# Patient Record
Sex: Female | Born: 1935
Health system: Southern US, Community
[De-identification: ages and names within clinical notes are randomized; demographics above are authoritative.]

## PROBLEM LIST (undated history)

## (undated) DIAGNOSIS — R079 Chest pain, unspecified: Secondary | ICD-10-CM

## (undated) DIAGNOSIS — R42 Dizziness and giddiness: Secondary | ICD-10-CM

## (undated) DIAGNOSIS — I10 Essential (primary) hypertension: Secondary | ICD-10-CM

## (undated) DIAGNOSIS — E785 Hyperlipidemia, unspecified: Secondary | ICD-10-CM

## (undated) HISTORY — DX: Essential (primary) hypertension: I10

## (undated) HISTORY — DX: Hyperlipidemia, unspecified: E78.5

## (undated) HISTORY — DX: Dizziness and giddiness: R42

## (undated) HISTORY — DX: Chest pain, unspecified: R07.9

---

## 1978-12-01 HISTORY — PX: BREAST SURGERY: SHX581

## 2013-12-07 DIAGNOSIS — I10 Essential (primary) hypertension: Secondary | ICD-10-CM | POA: Diagnosis not present

## 2013-12-21 DIAGNOSIS — I10 Essential (primary) hypertension: Secondary | ICD-10-CM | POA: Diagnosis not present

## 2014-03-22 DIAGNOSIS — I10 Essential (primary) hypertension: Secondary | ICD-10-CM | POA: Diagnosis not present

## 2014-03-22 DIAGNOSIS — E7800 Pure hypercholesterolemia, unspecified: Secondary | ICD-10-CM | POA: Diagnosis not present

## 2014-03-22 DIAGNOSIS — Z0000 Encounter for general adult medical examination without abnormal findings: Secondary | ICD-10-CM | POA: Diagnosis not present

## 2014-03-28 DIAGNOSIS — Z0000 Encounter for general adult medical examination without abnormal findings: Secondary | ICD-10-CM | POA: Diagnosis not present

## 2014-03-28 DIAGNOSIS — E559 Vitamin D deficiency, unspecified: Secondary | ICD-10-CM | POA: Diagnosis not present

## 2014-03-28 DIAGNOSIS — I10 Essential (primary) hypertension: Secondary | ICD-10-CM | POA: Diagnosis not present

## 2014-03-28 DIAGNOSIS — E7800 Pure hypercholesterolemia, unspecified: Secondary | ICD-10-CM | POA: Diagnosis not present

## 2014-03-29 DIAGNOSIS — Z1231 Encounter for screening mammogram for malignant neoplasm of breast: Secondary | ICD-10-CM | POA: Diagnosis not present

## 2014-03-29 DIAGNOSIS — R922 Inconclusive mammogram: Secondary | ICD-10-CM | POA: Diagnosis not present

## 2014-03-29 LAB — HM MAMMOGRAPHY

## 2014-08-28 DIAGNOSIS — H2510 Age-related nuclear cataract, unspecified eye: Secondary | ICD-10-CM | POA: Diagnosis not present

## 2014-10-09 DIAGNOSIS — H35033 Hypertensive retinopathy, bilateral: Secondary | ICD-10-CM | POA: Diagnosis not present

## 2014-10-09 DIAGNOSIS — H26491 Other secondary cataract, right eye: Secondary | ICD-10-CM | POA: Diagnosis not present

## 2014-10-09 DIAGNOSIS — H02839 Dermatochalasis of unspecified eye, unspecified eyelid: Secondary | ICD-10-CM | POA: Diagnosis not present

## 2014-10-09 DIAGNOSIS — Z961 Presence of intraocular lens: Secondary | ICD-10-CM | POA: Diagnosis not present

## 2015-11-08 ENCOUNTER — Encounter (INDEPENDENT_AMBULATORY_CARE_PROVIDER_SITE_OTHER): Payer: Self-pay

## 2015-11-08 ENCOUNTER — Ambulatory Visit (INDEPENDENT_AMBULATORY_CARE_PROVIDER_SITE_OTHER): Payer: Medicare Other | Admitting: Internal Medicine

## 2015-11-08 ENCOUNTER — Encounter: Payer: Self-pay | Admitting: Internal Medicine

## 2015-11-08 VITALS — BP 186/90 | HR 81 | Temp 98.4°F | Ht 58.5 in | Wt 144.0 lb

## 2015-11-08 DIAGNOSIS — E785 Hyperlipidemia, unspecified: Secondary | ICD-10-CM

## 2015-11-08 DIAGNOSIS — I10 Essential (primary) hypertension: Secondary | ICD-10-CM | POA: Diagnosis not present

## 2015-11-08 LAB — COMPREHENSIVE METABOLIC PANEL
ALK PHOS: 74 U/L (ref 39–117)
ALT: 15 U/L (ref 0–35)
AST: 23 U/L (ref 0–37)
Albumin: 4.2 g/dL (ref 3.5–5.2)
BUN: 11 mg/dL (ref 6–23)
CO2: 30 mEq/L (ref 19–32)
Calcium: 10.5 mg/dL (ref 8.4–10.5)
Chloride: 103 mEq/L (ref 96–112)
Creatinine, Ser: 1.06 mg/dL (ref 0.40–1.20)
GFR: 64.6 mL/min (ref >60.00)
GLUCOSE: 99 mg/dL (ref 70–99)
POTASSIUM: 4.5 meq/L (ref 3.5–5.1)
Sodium: 140 mEq/L (ref 135–145)
TOTAL PROTEIN: 7.4 g/dL (ref 6.0–8.3)
Total Bilirubin: 0.5 mg/dL (ref 0.2–1.2)

## 2015-11-08 LAB — CBC
HEMATOCRIT: 41.1 % (ref 36.0–46.0)
HEMOGLOBIN: 13.1 g/dL (ref 12.0–15.0)
MCHC: 31.8 g/dL (ref 30.0–36.0)
MCV: 85.5 fl (ref 78.0–100.0)
Platelets: 228 10*3/uL (ref 150.0–400.0)
RBC: 4.81 Mil/uL (ref 3.87–5.11)
RDW: 14.2 % (ref 11.5–15.5)
WBC: 5.6 10*3/uL (ref 4.0–10.5)

## 2015-11-08 LAB — LIPID PANEL
Cholesterol: 287 mg/dL — ABNORMAL HIGH (ref 0–200)
HDL: 58.7 mg/dL (ref >39.00)
LDL Cholesterol: 192 mg/dL — ABNORMAL HIGH (ref 0–99)
NONHDL: 227.95
TRIGLYCERIDES: 179 mg/dL — AB (ref 0.0–149.0)
Total CHOL/HDL Ratio: 5
VLDL: 35.8 mg/dL (ref 0.0–40.0)

## 2015-11-08 MED ORDER — LISINOPRIL-HYDROCHLOROTHIAZIDE 10-12.5 MG PO TABS: 1.0000 | ORAL_TABLET | Freq: Every day | ORAL | Status: DC

## 2015-11-08 NOTE — Assessment & Plan Note (Signed)
Encouraged her to consume a low fat diet Will check Lipid Profile and CMET today Will refill Pravachol if needed

## 2015-11-08 NOTE — Assessment & Plan Note (Signed)
CBC and CMET today Lisinopril HCT refilled  RTC in 1 month for BP followup

## 2015-11-08 NOTE — Patient Instructions (Signed)
Hypertension Hypertension, commonly called high blood pressure, is when the force of blood pumping through your arteries is too strong. Your arteries are the blood vessels that carry blood from your heart throughout your body. A blood pressure reading consists of a higher number over a lower number, such as 110/72. The higher number (systolic) is the pressure inside your arteries when your heart pumps. The lower number (diastolic) is the pressure inside your arteries when your heart relaxes. Ideally you want your blood pressure below 120/80. Hypertension forces your heart to work harder to pump blood. Your arteries may become narrow or stiff. Having untreated or uncontrolled hypertension can cause heart attack, stroke, kidney disease, and other problems. RISK FACTORS Some risk factors for high blood pressure are controllable. Others are not.  Risk factors you cannot control include:   Race. You may be at higher risk if you are African American.  Age. Risk increases with age.  Gender. Men are at higher risk than women before age 45 years. After age 65, women are at higher risk than men. Risk factors you can control include:  Not getting enough exercise or physical activity.  Being overweight.  Getting too much fat, sugar, calories, or salt in your diet.  Drinking too much alcohol. SIGNS AND SYMPTOMS Hypertension does not usually cause signs or symptoms. Extremely high blood pressure (hypertensive crisis) may cause headache, anxiety, shortness of breath, and nosebleed. DIAGNOSIS To check if you have hypertension, your health care provider will measure your blood pressure while you are seated, with your arm held at the level of your heart. It should be measured at least twice using the same arm. Certain conditions can cause a difference in blood pressure between your right and left arms. A blood pressure reading that is higher than normal on one occasion does not mean that you need treatment. If  it is not clear whether you have high blood pressure, you may be asked to return on a different day to have your blood pressure checked again. Or, you may be asked to monitor your blood pressure at home for 1 or more weeks. TREATMENT Treating high blood pressure includes making lifestyle changes and possibly taking medicine. Living a healthy lifestyle can help lower high blood pressure. You may need to change some of your habits. Lifestyle changes may include:  Following the DASH diet. This diet is high in fruits, vegetables, and whole grains. It is low in salt, red meat, and added sugars.  Keep your sodium intake below 2,300 mg per day.  Getting at least 30-45 minutes of aerobic exercise at least 4 times per week.  Losing weight if necessary.  Not smoking.  Limiting alcoholic beverages.  Learning ways to reduce stress. Your health care provider may prescribe medicine if lifestyle changes are not enough to get your blood pressure under control, and if one of the following is true:  You are 18-59 years of age and your systolic blood pressure is above 140.  You are 60 years of age or older, and your systolic blood pressure is above 150.  Your diastolic blood pressure is above 90.  You have diabetes, and your systolic blood pressure is over 140 or your diastolic blood pressure is over 90.  You have kidney disease and your blood pressure is above 140/90.  You have heart disease and your blood pressure is above 140/90. Your personal target blood pressure may vary depending on your medical conditions, your age, and other factors. HOME CARE INSTRUCTIONS    Have your blood pressure rechecked as directed by your health care provider.   Take medicines only as directed by your health care provider. Follow the directions carefully. Blood pressure medicines must be taken as prescribed. The medicine does not work as well when you skip doses. Skipping doses also puts you at risk for  problems.  Do not smoke.   Monitor your blood pressure at home as directed by your health care provider. SEEK MEDICAL CARE IF:   You think you are having a reaction to medicines taken.  You have recurrent headaches or feel dizzy.  You have swelling in your ankles.  You have trouble with your vision. SEEK IMMEDIATE MEDICAL CARE IF:  You develop a severe headache or confusion.  You have unusual weakness, numbness, or feel faint.  You have severe chest or abdominal pain.  You vomit repeatedly.  You have trouble breathing. MAKE SURE YOU:   Understand these instructions.  Will watch your condition.  Will get help right away if you are not doing well or get worse.   This information is not intended to replace advice given to you by your health care provider. Make sure you discuss any questions you have with your health care provider.   Document Released: 11/17/2005 Document Revised: 04/03/2015 Document Reviewed: 09/09/2013 Elsevier Interactive Patient Education 2016 Elsevier Inc.  

## 2015-11-08 NOTE — Addendum Note (Signed)
Addended by: Sarinity Dicicco C on: 11/08/2015 01:28 PM   Modules accepted: SmartSet  

## 2015-11-08 NOTE — Progress Notes (Signed)
Pre visit review using our clinic review tool, if applicable. No additional management support is needed unless otherwise documented below in the visit note. 

## 2015-11-08 NOTE — Progress Notes (Signed)
HPI  Pt presents to the clinic to the clinic today to establish care and for management of the conditions listed below. She is transferring care from Dr. Dirk DressZucker.  HLD: She is prescribed Pravastatin but she reports she has not taken it in the last year. She does try to consume a low fat diet.  HTN: Her BP today is 186/90. She is prescribed Lisinopril-HCT but has not taken it in the last year.   Flu: never Tetanus: never Pneumovax: never Prevnar: never Zostovax: never Pap Smear: 2012 Mammogram: 2014 Colon Screening: 2011 Vision screening: yearly Dentist: as needed  Past Medical History  Diagnosis Date  . Hyperlipidemia   . Hypertension     Current Outpatient Prescriptions  Medication Sig Dispense Refill  . lisinopril-hydrochlorothiazide (PRINZIDE,ZESTORETIC) 10-12.5 MG tablet Take 1 tablet by mouth daily.    . pravastatin (PRAVACHOL) 80 MG tablet Take 80 mg by mouth daily.     No current facility-administered medications for this visit.    No Known Allergies  Family History  Problem Relation Age of Onset  . Heart disease Mother   . Heart disease Father   . Stroke Maternal Grandmother   . Stroke Maternal Grandfather   . Heart disease Paternal Grandmother   . Heart disease Paternal Grandfather     Social History   Social History  . Marital Status: Married    Spouse Name: N/A  . Number of Children: N/A  . Years of Education: N/A   Occupational History  . Not on file.   Social History Main Topics  . Smoking status: Former Games developermoker  . Smokeless tobacco: Never Used     Comment: quit 30 years ago  . Alcohol Use: 0.0 oz/week    0 Standard drinks or equivalent per week     Comment: rare  . Drug Use: Not on file  . Sexual Activity: Not on file   Other Topics Concern  . Not on file   Social History Narrative  . No narrative on file    ROS:  Constitutional: Denies fever, malaise, fatigue, headache or abrupt weight changes.  HEENT: Denies eye pain, eye  redness, ear pain, ringing in the ears, wax buildup, runny nose, nasal congestion, bloody nose, or sore throat. Respiratory: Denies difficulty breathing, shortness of breath, cough or sputum production.   Cardiovascular: Denies chest pain, chest tightness, palpitations or swelling in the hands or feet.  Neurological: Denies dizziness, difficulty with memory, difficulty with speech or problems with balance and coordination.  Psych: Denies anxiety, depression, SI/HI.  No other specific complaints in a complete review of systems (except as listed in HPI above).  PE:  BP 186/90 mmHg  Pulse 81  Temp(Src) 98.4 F (36.9 C) (Oral)  Ht 4' 10.5" (1.486 m)  Wt 144 lb (65.318 kg)  BMI 29.58 kg/m2  SpO2 98% Wt Readings from Last 3 Encounters:  11/08/15 144 lb (65.318 kg)    General: Appears her stated age, well developed, well nourished in NAD. HEENT: Head: normal shape and size; Eyes: sclera white, no icterus, conjunctiva pink, PERRLA and EOMs intact;  Cardiovascular: Normal rate and rhythm. S1,S2 noted.  No murmur, rubs or gallops noted. No JVD or BLE edema. No carotid bruits noted. Pulmonary/Chest: Normal effort and positive vesicular breath sounds. No respiratory distress. No wheezes, rales or ronchi noted.  Neurological: Alert and oriented.  Psychiatric: Mood and affect normal. Behavior is normal. Judgment and thought content normal.     Assessment and Plan:

## 2015-11-09 MED ORDER — PRAVASTATIN SODIUM 80 MG PO TABS: 80.0000 mg | ORAL_TABLET | Freq: Every day | ORAL | Status: DC

## 2015-11-09 NOTE — Addendum Note (Signed)
Addended by: Roena MaladyEVONTENNO, Sharika Mosquera Y on: 11/09/2015 05:32 PM   Modules accepted: Orders

## 2015-12-13 ENCOUNTER — Ambulatory Visit (INDEPENDENT_AMBULATORY_CARE_PROVIDER_SITE_OTHER): Payer: Medicare Other | Admitting: Internal Medicine

## 2015-12-13 ENCOUNTER — Encounter: Payer: Self-pay | Admitting: Internal Medicine

## 2015-12-13 VITALS — BP 158/84 | HR 87 | Temp 98.0°F | Wt 140.0 lb

## 2015-12-13 DIAGNOSIS — I10 Essential (primary) hypertension: Secondary | ICD-10-CM | POA: Diagnosis not present

## 2015-12-13 MED ORDER — PRAVASTATIN SODIUM 80 MG PO TABS: 80.0000 mg | ORAL_TABLET | Freq: Every day | ORAL | Status: DC

## 2015-12-13 MED ORDER — LISINOPRIL-HYDROCHLOROTHIAZIDE 20-12.5 MG PO TABS: 1.0000 | ORAL_TABLET | Freq: Every day | ORAL | Status: DC

## 2015-12-13 NOTE — Progress Notes (Signed)
Pre visit review using our clinic review tool, if applicable. No additional management support is needed unless otherwise documented below in the visit note. 

## 2015-12-13 NOTE — Patient Instructions (Signed)
Hypertension Hypertension, commonly called high blood pressure, is when the force of blood pumping through your arteries is too strong. Your arteries are the blood vessels that carry blood from your heart throughout your body. A blood pressure reading consists of a higher number over a lower number, such as 110/72. The higher number (systolic) is the pressure inside your arteries when your heart pumps. The lower number (diastolic) is the pressure inside your arteries when your heart relaxes. Ideally you want your blood pressure below 120/80. Hypertension forces your heart to work harder to pump blood. Your arteries may become narrow or stiff. Having untreated or uncontrolled hypertension can cause heart attack, stroke, kidney disease, and other problems. RISK FACTORS Some risk factors for high blood pressure are controllable. Others are not.  Risk factors you cannot control include:   Race. You may be at higher risk if you are African American.  Age. Risk increases with age.  Gender. Men are at higher risk than women before age 45 years. After age 65, women are at higher risk than men. Risk factors you can control include:  Not getting enough exercise or physical activity.  Being overweight.  Getting too much fat, sugar, calories, or salt in your diet.  Drinking too much alcohol. SIGNS AND SYMPTOMS Hypertension does not usually cause signs or symptoms. Extremely high blood pressure (hypertensive crisis) may cause headache, anxiety, shortness of breath, and nosebleed. DIAGNOSIS To check if you have hypertension, your health care provider will measure your blood pressure while you are seated, with your arm held at the level of your heart. It should be measured at least twice using the same arm. Certain conditions can cause a difference in blood pressure between your right and left arms. A blood pressure reading that is higher than normal on one occasion does not mean that you need treatment. If  it is not clear whether you have high blood pressure, you may be asked to return on a different day to have your blood pressure checked again. Or, you may be asked to monitor your blood pressure at home for 1 or more weeks. TREATMENT Treating high blood pressure includes making lifestyle changes and possibly taking medicine. Living a healthy lifestyle can help lower high blood pressure. You may need to change some of your habits. Lifestyle changes may include:  Following the DASH diet. This diet is high in fruits, vegetables, and whole grains. It is low in salt, red meat, and added sugars.  Keep your sodium intake below 2,300 mg per day.  Getting at least 30-45 minutes of aerobic exercise at least 4 times per week.  Losing weight if necessary.  Not smoking.  Limiting alcoholic beverages.  Learning ways to reduce stress. Your health care provider may prescribe medicine if lifestyle changes are not enough to get your blood pressure under control, and if one of the following is true:  You are 18-59 years of age and your systolic blood pressure is above 140.  You are 60 years of age or older, and your systolic blood pressure is above 150.  Your diastolic blood pressure is above 90.  You have diabetes, and your systolic blood pressure is over 140 or your diastolic blood pressure is over 90.  You have kidney disease and your blood pressure is above 140/90.  You have heart disease and your blood pressure is above 140/90. Your personal target blood pressure may vary depending on your medical conditions, your age, and other factors. HOME CARE INSTRUCTIONS    Have your blood pressure rechecked as directed by your health care provider.   Take medicines only as directed by your health care provider. Follow the directions carefully. Blood pressure medicines must be taken as prescribed. The medicine does not work as well when you skip doses. Skipping doses also puts you at risk for  problems.  Do not smoke.   Monitor your blood pressure at home as directed by your health care provider. SEEK MEDICAL CARE IF:   You think you are having a reaction to medicines taken.  You have recurrent headaches or feel dizzy.  You have swelling in your ankles.  You have trouble with your vision. SEEK IMMEDIATE MEDICAL CARE IF:  You develop a severe headache or confusion.  You have unusual weakness, numbness, or feel faint.  You have severe chest or abdominal pain.  You vomit repeatedly.  You have trouble breathing. MAKE SURE YOU:   Understand these instructions.  Will watch your condition.  Will get help right away if you are not doing well or get worse.   This information is not intended to replace advice given to you by your health care provider. Make sure you discuss any questions you have with your health care provider.   Document Released: 11/17/2005 Document Revised: 04/03/2015 Document Reviewed: 09/09/2013 Elsevier Interactive Patient Education 2016 Elsevier Inc. Hypertension Hypertension, commonly called high blood pressure, is when the force of blood pumping through your arteries is too strong. Your arteries are the blood vessels that carry blood from your heart throughout your body. A blood pressure reading consists of a higher number over a lower number, such as 110/72. The higher number (systolic) is the pressure inside your arteries when your heart pumps. The lower number (diastolic) is the pressure inside your arteries when your heart relaxes. Ideally you want your blood pressure below 120/80. Hypertension forces your heart to work harder to pump blood. Your arteries may become narrow or stiff. Having untreated or uncontrolled hypertension can cause heart attack, stroke, kidney disease, and other problems. RISK FACTORS Some risk factors for high blood pressure are controllable. Others are not.  Risk factors you cannot control include:   Race. You may  be at higher risk if you are African American.  Age. Risk increases with age.  Gender. Men are at higher risk than women before age 45 years. After age 65, women are at higher risk than men. Risk factors you can control include:  Not getting enough exercise or physical activity.  Being overweight.  Getting too much fat, sugar, calories, or salt in your diet.  Drinking too much alcohol. SIGNS AND SYMPTOMS Hypertension does not usually cause signs or symptoms. Extremely high blood pressure (hypertensive crisis) may cause headache, anxiety, shortness of breath, and nosebleed. DIAGNOSIS To check if you have hypertension, your health care provider will measure your blood pressure while you are seated, with your arm held at the level of your heart. It should be measured at least twice using the same arm. Certain conditions can cause a difference in blood pressure between your right and left arms. A blood pressure reading that is higher than normal on one occasion does not mean that you need treatment. If it is not clear whether you have high blood pressure, you may be asked to return on a different day to have your blood pressure checked again. Or, you may be asked to monitor your blood pressure at home for 1 or more weeks. TREATMENT Treating high blood pressure includes   making lifestyle changes and possibly taking medicine. Living a healthy lifestyle can help lower high blood pressure. You may need to change some of your habits. Lifestyle changes may include:  Following the DASH diet. This diet is high in fruits, vegetables, and whole grains. It is low in salt, red meat, and added sugars.  Keep your sodium intake below 2,300 mg per day.  Getting at least 30-45 minutes of aerobic exercise at least 4 times per week.  Losing weight if necessary.  Not smoking.  Limiting alcoholic beverages.  Learning ways to reduce stress. Your health care provider may prescribe medicine if lifestyle  changes are not enough to get your blood pressure under control, and if one of the following is true:  You are 18-59 years of age and your systolic blood pressure is above 140.  You are 60 years of age or older, and your systolic blood pressure is above 150.  Your diastolic blood pressure is above 90.  You have diabetes, and your systolic blood pressure is over 140 or your diastolic blood pressure is over 90.  You have kidney disease and your blood pressure is above 140/90.  You have heart disease and your blood pressure is above 140/90. Your personal target blood pressure may vary depending on your medical conditions, your age, and other factors. HOME CARE INSTRUCTIONS  Have your blood pressure rechecked as directed by your health care provider.   Take medicines only as directed by your health care provider. Follow the directions carefully. Blood pressure medicines must be taken as prescribed. The medicine does not work as well when you skip doses. Skipping doses also puts you at risk for problems.  Do not smoke.   Monitor your blood pressure at home as directed by your health care provider. SEEK MEDICAL CARE IF:   You think you are having a reaction to medicines taken.  You have recurrent headaches or feel dizzy.  You have swelling in your ankles.  You have trouble with your vision. SEEK IMMEDIATE MEDICAL CARE IF:  You develop a severe headache or confusion.  You have unusual weakness, numbness, or feel faint.  You have severe chest or abdominal pain.  You vomit repeatedly.  You have trouble breathing. MAKE SURE YOU:   Understand these instructions.  Will watch your condition.  Will get help right away if you are not doing well or get worse.   This information is not intended to replace advice given to you by your health care provider. Make sure you discuss any questions you have with your health care provider.   Document Released: 11/17/2005 Document  Revised: 04/03/2015 Document Reviewed: 09/09/2013 Elsevier Interactive Patient Education 2016 Elsevier Inc.  

## 2015-12-13 NOTE — Progress Notes (Signed)
Subjective:    Patient ID: Haley Randall, female    DOB: [birth year], 80 y.o.   MRN: [id number]  HPI  Pt presents to the clinic today for 1 month follow up of HTN. She had been out of her BP medication for a while. Her BP at her last visit was 186/90. She was given a refill for Lisinopril HCT 10-12.5. She has been taking the medication as directed and denies adverse effects. Her BP today is 158/84. There is no ECG on file.  Review of Systems  Past Medical History  Diagnosis Date  . Hyperlipidemia   . Hypertension     Current Outpatient Prescriptions  Medication Sig Dispense Refill  . pravastatin (PRAVACHOL) 80 MG tablet Take 1 tablet (80 mg total) by mouth daily. 30 tablet 3  . lisinopril-hydrochlorothiazide (PRINZIDE,ZESTORETIC) 20-12.5 MG tablet Take 1 tablet by mouth daily. 30 tablet 2   No current facility-administered medications for this visit.    No Known Allergies  Family History  Problem Relation Age of Onset  . Heart disease Mother   . Heart disease Father   . Stroke Maternal Grandmother   . Stroke Maternal Grandfather   . Heart disease Paternal Grandmother   . Heart disease Paternal Grandfather   . Cancer Neg Hx   . Diabetes Neg Hx     Social History   Social History  . Marital Status: Married    Spouse Name: N/A  . Number of Children: N/A  . Years of Education: N/A   Occupational History  . Not on file.   Social History Main Topics  . Smoking status: Former Games developer  . Smokeless tobacco: Never Used     Comment: quit 30 years ago  . Alcohol Use: 0.0 oz/week    0 Standard drinks or equivalent per week     Comment: rare  . Drug Use: No  . Sexual Activity: No   Other Topics Concern  . Not on file   Social History Narrative     Constitutional: Denies fever, malaise, fatigue, headache or abrupt weight changes.  Respiratory: Denies difficulty breathing, shortness of breath, cough or sputum production.   Cardiovascular: Denies chest pain, chest  tightness, palpitations or swelling in the hands or feet.   Neurological: Denies dizziness, difficulty with memory, difficulty with speech or problems with balance and coordination.    No other specific complaints in a complete review of systems (except as listed in HPI above).     Objective:   Physical Exam   BP 158/84 mmHg  Pulse 87  Temp(Src) 98 F (36.7 C) (Oral)  Wt 140 lb (63.504 kg)  SpO2 98% Wt Readings from Last 3 Encounters:  12/13/15 140 lb (63.504 kg)  11/08/15 144 lb (65.318 kg)    General: Appears her stated age, well developed, well nourished in NAD. Cardiovascular: Normal rate and rhythm. S1,S2 noted.  No murmur, rubs or gallops noted. No JVD or BLE edema.  Pulmonary/Chest: Normal effort and positive vesicular breath sounds. No respiratory distress. No wheezes, rales or ronchi noted.  Neurological: Alert and oriented.   BMET    Component Value Date/Time   NA 140 11/08/2015 1116   K 4.5 11/08/2015 1116   CL 103 11/08/2015 1116   CO2 30 11/08/2015 1116   GLUCOSE 99 11/08/2015 1116   BUN 11 11/08/2015 1116   CREATININE 1.06 11/08/2015 1116   CALCIUM 10.5 11/08/2015 1116    Lipid Panel     Component Value Date/Time   CHOL  287* 11/08/2015 1116   TRIG 179.0* 11/08/2015 1116   HDL 58.70 11/08/2015 1116   CHOLHDL 5 11/08/2015 1116   VLDL 35.8 11/08/2015 1116   LDLCALC 192* 11/08/2015 1116    CBC    Component Value Date/Time   WBC 5.6 11/08/2015 1116   RBC 4.81 11/08/2015 1116   HGB 13.1 11/08/2015 1116   HCT 41.1 11/08/2015 1116   PLT 228.0 11/08/2015 1116   MCV 85.5 11/08/2015 1116   MCHC 31.8 11/08/2015 1116   RDW 14.2 11/08/2015 1116    Hgb A1C No results found for: HGBA1C      Assessment & Plan:

## 2015-12-13 NOTE — Assessment & Plan Note (Signed)
ECG today normal BP still not at goal Increase Lisinopril HCT to 20-12.5 (she has already refilled the previous dose for this month and does not want to waste her money)  RTC in 2 months to follow up HTN/HLD

## 2016-01-04 ENCOUNTER — Telehealth: Payer: Self-pay

## 2016-01-04 MED ORDER — LISINOPRIL-HYDROCHLOROTHIAZIDE 20-12.5 MG PO TABS: 1.0000 | ORAL_TABLET | Freq: Every day | ORAL | Status: DC

## 2016-01-04 NOTE — Telephone Encounter (Signed)
Pt wants to use [facility] now--called [facility] and canceled Rx--Rx sent to [facility]--pt is aware

## 2016-01-04 NOTE — Telephone Encounter (Signed)
Freja Faro [phone] (H) [facility]   Kelis would like her prescription sent back to [facility], she stated that [facility] could not get everything together so she wants to go back to [facility]. She has about 4 days left of her high blood pressure medicine. She did not get any of her prescriptions from [facility]. Please call Ms. Fredric Mare when you send RX in.

## 2016-02-12 ENCOUNTER — Encounter: Payer: Self-pay | Admitting: Internal Medicine

## 2016-02-12 ENCOUNTER — Ambulatory Visit (INDEPENDENT_AMBULATORY_CARE_PROVIDER_SITE_OTHER): Payer: Medicare Other | Admitting: Internal Medicine

## 2016-02-12 VITALS — BP 150/68 | HR 74 | Temp 98.2°F | Wt 136.0 lb

## 2016-02-12 DIAGNOSIS — E785 Hyperlipidemia, unspecified: Secondary | ICD-10-CM | POA: Diagnosis not present

## 2016-02-12 DIAGNOSIS — R0989 Other specified symptoms and signs involving the circulatory and respiratory systems: Secondary | ICD-10-CM | POA: Diagnosis not present

## 2016-02-12 DIAGNOSIS — I10 Essential (primary) hypertension: Secondary | ICD-10-CM

## 2016-02-12 LAB — LIPID PANEL
CHOL/HDL RATIO: 3
Cholesterol: 161 mg/dL (ref 0–200)
HDL: 61.9 mg/dL (ref >39.00)
LDL CALC: 76 mg/dL (ref 0–99)
NONHDL: 99.09
Triglycerides: 114 mg/dL (ref 0.0–149.0)
VLDL: 22.8 mg/dL (ref 0.0–40.0)

## 2016-02-12 LAB — COMPREHENSIVE METABOLIC PANEL
ALK PHOS: 54 U/L (ref 39–117)
ALT: 13 U/L (ref 0–35)
AST: 18 U/L (ref 0–37)
Albumin: 4.1 g/dL (ref 3.5–5.2)
BUN: 17 mg/dL (ref 6–23)
CALCIUM: 10.4 mg/dL (ref 8.4–10.5)
CHLORIDE: 104 meq/L (ref 96–112)
CO2: 30 meq/L (ref 19–32)
Creatinine, Ser: 1.14 mg/dL (ref 0.40–1.20)
GFR: 59.36 mL/min — AB (ref >60.00)
GLUCOSE: 80 mg/dL (ref 70–99)
POTASSIUM: 4.4 meq/L (ref 3.5–5.1)
Sodium: 142 mEq/L (ref 135–145)
Total Bilirubin: 0.5 mg/dL (ref 0.2–1.2)
Total Protein: 7 g/dL (ref 6.0–8.3)

## 2016-02-12 NOTE — Progress Notes (Signed)
Pre visit review using our clinic review tool, if applicable. No additional management support is needed unless otherwise documented below in the visit note. 

## 2016-02-12 NOTE — Assessment & Plan Note (Signed)
Advised her to take her Lisinopril-HCT before she comes to her visit CMET today

## 2016-02-12 NOTE — Assessment & Plan Note (Signed)
Will check Lipid Profile and CMET today Encouraged her to consume a low fat diet Continue Pravastatin unless directed otherwise

## 2016-02-12 NOTE — Progress Notes (Signed)
Subjective:    Patient ID: Haley Randall, female    DOB: [birth year], 80 y.o.   MRN: [id number]  HPI  Pt presents to the clinic today to follow up chronic conditions.   HTN: She is taking Lisinopril-HCT as prescribed. She denies adverse effects. Her BP today is 150/68, but she reports she has not taken her BP medication this morning. ECG from 12/2015 reviewed.  HLD: Total Cholesterol 287, Triglycerides 287, LDL 192. She was restarted on her Pravastatin 11/2015. She denies myalgias. She has been trying to consume a low fat diet.  Review of Systems      Past Medical History  Diagnosis Date  . Hyperlipidemia   . Hypertension     Current Outpatient Prescriptions  Medication Sig Dispense Refill  . lisinopril-hydrochlorothiazide (PRINZIDE,ZESTORETIC) 20-12.5 MG tablet Take 1 tablet by mouth daily. 30 tablet 2  . pravastatin (PRAVACHOL) 80 MG tablet Take 1 tablet (80 mg total) by mouth daily. 30 tablet 3   No current facility-administered medications for this visit.    No Known Allergies  Family History  Problem Relation Age of Onset  . Heart disease Mother   . Heart disease Father   . Stroke Maternal Grandmother   . Stroke Maternal Grandfather   . Heart disease Paternal Grandmother   . Heart disease Paternal Grandfather   . Cancer Neg Hx   . Diabetes Neg Hx     Social History   Social History  . Marital Status: Married    Spouse Name: N/A  . Number of Children: N/A  . Years of Education: N/A   Occupational History  . Not on file.   Social History Main Topics  . Smoking status: Former Games developer  . Smokeless tobacco: Never Used     Comment: quit 30 years ago  . Alcohol Use: 0.0 oz/week    0 Standard drinks or equivalent per week     Comment: rare  . Drug Use: No  . Sexual Activity: No   Other Topics Concern  . Not on file   Social History Narrative     Constitutional: Denies fever, malaise, fatigue, headache or abrupt weight changes.  Respiratory: Denies  difficulty breathing, shortness of breath, cough or sputum production.   Cardiovascular: Denies chest pain, chest tightness, palpitations or swelling in the hands or feet.  Musculoskeletal: Denies decrease in range of motion, difficulty with gait, muscle pain or joint pain and swelling.  Neurological: Denies dizziness, difficulty with memory, difficulty with speech or problems with balance and coordination.    No other specific complaints in a complete review of systems (except as listed in HPI above).  Objective:   Physical Exam  BP 150/68 mmHg  Pulse 74  Temp(Src) 98.2 F (36.8 C) (Oral)  Wt 136 lb (61.689 kg)  SpO2 100% Wt Readings from Last 3 Encounters:  02/12/16 136 lb (61.689 kg)  12/13/15 140 lb (63.504 kg)  11/08/15 144 lb (65.318 kg)    General: Appears her stated age, well developed, well nourished in NAD. Cardiovascular: Normal rate and rhythm. S1,S2 noted.  No murmur, rubs or gallops noted. No JVD or BLE edema. Bilateral carotid bruits noted, L>R. Pulmonary/Chest: Normal effort and positive vesicular breath sounds. No respiratory distress. No wheezes, rales or ronchi noted.  Neurological: Alert and oriented.    BMET    Component Value Date/Time   NA 140 11/08/2015 1116   K 4.5 11/08/2015 1116   CL 103 11/08/2015 1116   CO2 30 11/08/2015 1116  GLUCOSE 99 11/08/2015 1116   BUN 11 11/08/2015 1116   CREATININE 1.06 11/08/2015 1116   CALCIUM 10.5 11/08/2015 1116    Lipid Panel     Component Value Date/Time   CHOL 287* 11/08/2015 1116   TRIG 179.0* 11/08/2015 1116   HDL 58.70 11/08/2015 1116   CHOLHDL 5 11/08/2015 1116   VLDL 35.8 11/08/2015 1116   LDLCALC 192* 11/08/2015 1116    CBC    Component Value Date/Time   WBC 5.6 11/08/2015 1116   RBC 4.81 11/08/2015 1116   HGB 13.1 11/08/2015 1116   HCT 41.1 11/08/2015 1116   PLT 228.0 11/08/2015 1116   MCV 85.5 11/08/2015 1116   MCHC 31.8 11/08/2015 1116   RDW 14.2 11/08/2015 1116    Hgb A1C No  results found for: HGBA1C       Assessment & Plan:   Bilateral Carotid Bruits, L>R:  Will obtain carotid ultrasound Start baby ASA daily  RTC in 6 months for your Wellness Exam

## 2016-02-12 NOTE — Patient Instructions (Signed)

## 2016-02-29 ENCOUNTER — Ambulatory Visit: Payer: Medicare Other

## 2016-02-29 DIAGNOSIS — R0989 Other specified symptoms and signs involving the circulatory and respiratory systems: Secondary | ICD-10-CM

## 2016-04-01 ENCOUNTER — Encounter: Payer: Self-pay | Admitting: Internal Medicine

## 2016-04-05 ENCOUNTER — Other Ambulatory Visit: Payer: Self-pay | Admitting: Internal Medicine

## 2016-04-07 ENCOUNTER — Other Ambulatory Visit: Payer: Self-pay | Admitting: Internal Medicine

## 2016-08-12 ENCOUNTER — Other Ambulatory Visit: Payer: Self-pay | Admitting: Internal Medicine

## 2016-08-18 ENCOUNTER — Encounter: Payer: Self-pay | Admitting: Internal Medicine

## 2016-08-18 ENCOUNTER — Ambulatory Visit (INDEPENDENT_AMBULATORY_CARE_PROVIDER_SITE_OTHER): Payer: Medicare Other | Admitting: Internal Medicine

## 2016-08-18 VITALS — BP 162/66 | HR 99 | Temp 98.9°F | Ht 58.25 in | Wt 117.8 lb

## 2016-08-18 DIAGNOSIS — R0989 Other specified symptoms and signs involving the circulatory and respiratory systems: Secondary | ICD-10-CM | POA: Diagnosis not present

## 2016-08-18 DIAGNOSIS — Z0000 Encounter for general adult medical examination without abnormal findings: Secondary | ICD-10-CM

## 2016-08-18 DIAGNOSIS — E785 Hyperlipidemia, unspecified: Secondary | ICD-10-CM

## 2016-08-18 DIAGNOSIS — I10 Essential (primary) hypertension: Secondary | ICD-10-CM

## 2016-08-18 LAB — CBC
HEMATOCRIT: 35.2 % — AB (ref 36.0–46.0)
HEMOGLOBIN: 11.6 g/dL — AB (ref 12.0–15.0)
MCHC: 32.9 g/dL (ref 30.0–36.0)
MCV: 84.6 fl (ref 78.0–100.0)
Platelets: 289 10*3/uL (ref 150.0–400.0)
RBC: 4.17 Mil/uL (ref 3.87–5.11)
RDW: 13.5 % (ref 11.5–15.5)
WBC: 5.4 10*3/uL (ref 4.0–10.5)

## 2016-08-18 LAB — LIPID PANEL
CHOLESTEROL: 154 mg/dL (ref 0–200)
HDL: 54.6 mg/dL (ref >39.00)
LDL Cholesterol: 78 mg/dL (ref 0–99)
NONHDL: 99.4
Total CHOL/HDL Ratio: 3
Triglycerides: 108 mg/dL (ref 0.0–149.0)
VLDL: 21.6 mg/dL (ref 0.0–40.0)

## 2016-08-18 LAB — COMPREHENSIVE METABOLIC PANEL
ALBUMIN: 4.1 g/dL (ref 3.5–5.2)
ALK PHOS: 52 U/L (ref 39–117)
ALT: 14 U/L (ref 0–35)
AST: 20 U/L (ref 0–37)
BILIRUBIN TOTAL: 0.7 mg/dL (ref 0.2–1.2)
BUN: 22 mg/dL (ref 6–23)
CO2: 29 mEq/L (ref 19–32)
CREATININE: 1.49 mg/dL — AB (ref 0.40–1.20)
Calcium: 10.5 mg/dL (ref 8.4–10.5)
Chloride: 101 mEq/L (ref 96–112)
GFR: 43.52 mL/min — ABNORMAL LOW (ref >60.00)
Glucose, Bld: 96 mg/dL (ref 70–99)
Potassium: 4.3 mEq/L (ref 3.5–5.1)
SODIUM: 138 meq/L (ref 135–145)
TOTAL PROTEIN: 7.6 g/dL (ref 6.0–8.3)

## 2016-08-18 MED ORDER — PRAVASTATIN SODIUM 80 MG PO TABS: ORAL_TABLET | ORAL | 3 refills | Status: DC

## 2016-08-18 MED ORDER — ASPIRIN EC 81 MG PO TBEC: 81.0000 mg | DELAYED_RELEASE_TABLET | Freq: Every day | ORAL | 3 refills | Status: DC

## 2016-08-18 MED ORDER — LISINOPRIL-HYDROCHLOROTHIAZIDE 20-12.5 MG PO TABS: 1.0000 | ORAL_TABLET | Freq: Every day | ORAL | 3 refills | Status: DC

## 2016-08-18 NOTE — Patient Instructions (Signed)

## 2016-08-18 NOTE — Assessment & Plan Note (Signed)
Advised her to make sure she takes her medication daily Lisinopril HCT refilled today CBC and CMET today

## 2016-08-18 NOTE — Assessment & Plan Note (Signed)
Will repeat ultrasound yearly Continue baby ASA

## 2016-08-18 NOTE — Progress Notes (Signed)
HPI:  Pt presents to the clinic today for her Medicare Wellness Exam. She is also due to follow up chronic conditions.  HTN: She is taking Lisinopril-HCT as prescribed. She denies adverse effects. Her BP today is 162/66. She reports she ran out of her BP medication on Saturday. ECG from 12/2015 reviewed.  HLD: Total Cholesterol 161, Triglycerides 114, LDL 76. She is taking Pravastatin as prescribed. She denies myalgias. She has been trying to consume a low fat diet.  Bilateral Carotid Bruits: Carotid ultrasound from 01/2016 reviewed. She is taking a baby ASA daily.  She has had a 19 lb weight loss in the last 6 months. She reports this was intentional. She is eating less meat, less fried foods and she has limited her salt intake.   Past Medical History:  Diagnosis Date  . Hyperlipidemia   . Hypertension     Current Outpatient Prescriptions  Medication Sig Dispense Refill  . lisinopril-hydrochlorothiazide (PRINZIDE,ZESTORETIC) 20-12.5 MG tablet TAKE 1 TABLET BY MOUTH DAILY. 30 tablet 1  . pravastatin (PRAVACHOL) 80 MG tablet TAKE 1 TABLET (80 MG TOTAL) BY MOUTH DAILY. 90 tablet 1   No current facility-administered medications for this visit.     No Known Allergies  Family History  Problem Relation Age of Onset  . Heart disease Mother   . Heart disease Father   . Stroke Maternal Grandmother   . Stroke Maternal Grandfather   . Heart disease Paternal Grandmother   . Heart disease Paternal Grandfather   . Cancer Neg Hx   . Diabetes Neg Hx     Social History   Social History  . Marital status: Married    Spouse name: N/A  . Number of children: N/A  . Years of education: N/A   Occupational History  . Not on file.   Social History Main Topics  . Smoking status: Former Games developermoker  . Smokeless tobacco: Never Used     Comment: quit 30 years ago  . Alcohol use 0.0 oz/week     Comment: rare  . Drug use: No  . Sexual activity: No   Other Topics Concern  . Not on file    Social History Narrative  . No narrative on file    Hospitiliaztions: None    Flu: never   Tetanus: never  Pneumovax: never  Prevnar: never  Zostavax: never  Mammogram: 03/2014  Pap Smear: unsure, no longer screening  Bone Density: > 5 years ago  Colon Screening: ? 2011  Eye Doctor: every 2 years at [facility]  Dental Exam: as needed   Providers:   PCP: Nicki Reaperegina Baity, NP-C   I have personally reviewed and have noted:  1. The patient's medical and social history 2. Their use of alcohol, tobacco or illicit drugs 3. Their current medications and supplements 4. The patient's functional ability including ADL's, fall risks, home safety risks and hearing or visual impairment. 5. Diet and physical activities 6. Evidence for depression or mood disorder  Subjective:   Review of Systems:   Constitutional: Pt reports intentional weight loss. Denies fever, malaise, fatigue, headache.  HEENT: Denies eye pain, eye redness, ear pain, ringing in the ears, wax buildup, runny nose, nasal congestion, bloody nose, or sore throat. Respiratory: Denies difficulty breathing, shortness of breath, cough or sputum production.   Cardiovascular: Denies chest pain, chest tightness, palpitations or swelling in the hands or feet.  Gastrointestinal: Denies abdominal pain, bloating, constipation, diarrhea or blood in the stool.  GU: Denies urgency, frequency, pain  with urination, burning sensation, blood in urine, odor or discharge. Musculoskeletal: Denies decrease in range of motion, difficulty with gait, muscle pain or joint pain and swelling.  Skin: Denies redness, rashes, lesions or ulcercations.  Neurological: Denies dizziness, difficulty with memory, difficulty with speech or problems with balance and coordination.  Psych: Denies anxiety, depression, SI/HI.  No other specific complaints in a complete review of systems (except as listed in HPI above).  Objective:  PE:  BP (!) 162/66    Pulse 99   Temp 98.9 F (37.2 C) (Oral)   Ht 4' 10.25" (1.48 m)   Wt 117 lb 12 oz (53.4 kg)   SpO2 99%   BMI 24.40 kg/m   Wt Readings from Last 3 Encounters:  02/12/16 136 lb (61.7 kg)  12/13/15 140 lb (63.5 kg)  11/08/15 144 lb (65.3 kg)    General: Appears her stated age, well developed, well nourished in NAD. Skin: Warm, dry and intact. No rashes, lesions or ulcerations noted. Cardiovascular: Normal rate and rhythm. S1,S2 noted.  No murmur, rubs or gallops noted. No JVD or BLE edema. Bilateral carotid bruits noted. Pulmonary/Chest: Normal effort and positive vesicular breath sounds. No respiratory distress. No wheezes, rales or ronchi noted.  Neurological: Alert and oriented.  Psychiatric: Mood and affect normal. Behavior is normal. Judgment and thought content normal.     BMET    Component Value Date/Time   NA 142 02/12/2016 0850   K 4.4 02/12/2016 0850   CL 104 02/12/2016 0850   CO2 30 02/12/2016 0850   GLUCOSE 80 02/12/2016 0850   BUN 17 02/12/2016 0850   CREATININE 1.14 02/12/2016 0850   CALCIUM 10.4 02/12/2016 0850    Lipid Panel     Component Value Date/Time   CHOL 161 02/12/2016 0850   TRIG 114.0 02/12/2016 0850   HDL 61.90 02/12/2016 0850   CHOLHDL 3 02/12/2016 0850   VLDL 22.8 02/12/2016 0850   LDLCALC 76 02/12/2016 0850    CBC    Component Value Date/Time   WBC 5.6 11/08/2015 1116   RBC 4.81 11/08/2015 1116   HGB 13.1 11/08/2015 1116   HCT 41.1 11/08/2015 1116   PLT 228.0 11/08/2015 1116   MCV 85.5 11/08/2015 1116   MCHC 31.8 11/08/2015 1116   RDW 14.2 11/08/2015 1116    Hgb A1C No results found for: HGBA1C    Assessment and Plan:   Medicare Annual Wellness Visit:  Diet: Heart healthy Physical activity: Sedentary Depression/mood screen: Negative Hearing: Intact to whispered voice Visual acuity: Grossly normal ADLs: Capable Fall risk: None Home safety: Good Cognitive evaluation: Intact to orientation, naming, recall and  repetition EOL planning: No adv directives (she does not want info), full code/ I agree  Preventative Medicine: She declines flu, tetanus, pneumovax, prevnar or zostovax. She no longer wants to do pap smear, mammogram or colonoscopy. She declines bone density screening. Encouraged her to consume a balanced diet and exercise regimen. Encouraged her to see an eye doctor and dentist annually   Next appointment: 1 year- Medicare Wellness and Follow up   Nicki Reaper, NP

## 2016-08-18 NOTE — Assessment & Plan Note (Signed)
Encouraged her to consume a low fat diet Continue Pravastatin CMET and Lipid Profile today

## 2016-08-20 MED ORDER — LISINOPRIL 20 MG PO TABS: 20.0000 mg | ORAL_TABLET | Freq: Every day | ORAL | 1 refills | Status: DC

## 2016-08-20 NOTE — Addendum Note (Signed)
Addended by: Roena MaladyEVONTENNO, Jarrad Mclees Y on: 08/20/2016 04:35 PM   Modules accepted: Orders

## 2016-09-12 ENCOUNTER — Other Ambulatory Visit (INDEPENDENT_AMBULATORY_CARE_PROVIDER_SITE_OTHER): Payer: Medicare Other

## 2016-09-12 DIAGNOSIS — I10 Essential (primary) hypertension: Secondary | ICD-10-CM

## 2016-09-12 LAB — BASIC METABOLIC PANEL
BUN: 14 mg/dL (ref 6–23)
CALCIUM: 10 mg/dL (ref 8.4–10.5)
CO2: 29 mEq/L (ref 19–32)
CREATININE: 1.27 mg/dL — AB (ref 0.40–1.20)
Chloride: 108 mEq/L (ref 96–112)
GFR: 52.32 mL/min — ABNORMAL LOW (ref >60.00)
Glucose, Bld: 98 mg/dL (ref 70–99)
Potassium: 4.3 mEq/L (ref 3.5–5.1)
Sodium: 143 mEq/L (ref 135–145)

## 2017-02-17 ENCOUNTER — Telehealth: Payer: Self-pay

## 2017-02-17 NOTE — Telephone Encounter (Signed)
2nd attempt to contact patient for scheduling carotid us 1 yr .  lmov to call office .  Mailed letter.

## 2017-02-18 ENCOUNTER — Other Ambulatory Visit: Payer: Self-pay | Admitting: Internal Medicine

## 2017-03-17 ENCOUNTER — Other Ambulatory Visit: Payer: Self-pay | Admitting: Internal Medicine

## 2017-03-17 DIAGNOSIS — I779 Disorder of arteries and arterioles, unspecified: Secondary | ICD-10-CM

## 2017-03-17 DIAGNOSIS — I739 Peripheral vascular disease, unspecified: Principal | ICD-10-CM

## 2017-03-18 ENCOUNTER — Ambulatory Visit: Payer: Medicare Other

## 2017-03-18 DIAGNOSIS — I739 Peripheral vascular disease, unspecified: Principal | ICD-10-CM

## 2017-03-18 DIAGNOSIS — I6523 Occlusion and stenosis of bilateral carotid arteries: Secondary | ICD-10-CM | POA: Diagnosis not present

## 2017-03-18 DIAGNOSIS — I779 Disorder of arteries and arterioles, unspecified: Secondary | ICD-10-CM

## 2017-03-18 LAB — VAS US CAROTID
LCCAPSYS: 114 cm/s
LEFT ECA DIAS: -3 cm/s
LEFT VERTEBRAL DIAS: -4 cm/s
LICADDIAS: -32 cm/s
LICAPSYS: -176 cm/s
Left CCA dist dias: 21 cm/s
Left CCA dist sys: 96 cm/s
Left CCA prox dias: 16 cm/s
Left ICA dist sys: -135 cm/s
Left ICA prox dias: -18 cm/s
RCCAPSYS: 146 cm/s
RIGHT ECA DIAS: 0 cm/s
RIGHT VERTEBRAL DIAS: -18 cm/s
Right CCA prox dias: 0 cm/s

## 2017-05-06 ENCOUNTER — Encounter: Payer: Self-pay | Admitting: Medical Oncology

## 2017-05-06 ENCOUNTER — Emergency Department: Payer: Medicare Other

## 2017-05-06 ENCOUNTER — Emergency Department
Admission: EM | Admit: 2017-05-06 | Discharge: 2017-05-06 | Disposition: A | Discharge status: Home / Self Care | Payer: Medicare Other | Attending: Emergency Medicine | Admitting: Emergency Medicine

## 2017-05-06 DIAGNOSIS — M19072 Primary osteoarthritis, left ankle and foot: Secondary | ICD-10-CM | POA: Insufficient documentation

## 2017-05-06 DIAGNOSIS — I10 Essential (primary) hypertension: Secondary | ICD-10-CM | POA: Diagnosis not present

## 2017-05-06 DIAGNOSIS — Z79899 Other long term (current) drug therapy: Secondary | ICD-10-CM | POA: Diagnosis not present

## 2017-05-06 DIAGNOSIS — M79672 Pain in left foot: Secondary | ICD-10-CM | POA: Diagnosis present

## 2017-05-06 DIAGNOSIS — Z87891 Personal history of nicotine dependence: Secondary | ICD-10-CM | POA: Insufficient documentation

## 2017-05-06 DIAGNOSIS — M7989 Other specified soft tissue disorders: Secondary | ICD-10-CM | POA: Diagnosis not present

## 2017-05-06 DIAGNOSIS — Z7982 Long term (current) use of aspirin: Secondary | ICD-10-CM | POA: Insufficient documentation

## 2017-05-06 DIAGNOSIS — M79671 Pain in right foot: Secondary | ICD-10-CM

## 2017-05-06 DIAGNOSIS — M19071 Primary osteoarthritis, right ankle and foot: Secondary | ICD-10-CM | POA: Diagnosis not present

## 2017-05-06 MED ORDER — MELOXICAM 7.5 MG PO TABS: 7.5000 mg | ORAL_TABLET | Freq: Every day | ORAL | 0 refills | Status: DC

## 2017-05-06 NOTE — Discharge Instructions (Signed)
Follow-up with your  primary care doctor if not improving in 4-5 days. Wear postop shoe to prevent foot from bending. Take meloxicam 7.5 once daily with food. Discontinue taking this medication if there is any stomach upset.

## 2017-05-06 NOTE — ED Provider Notes (Signed)
[facility] Emergency Department Provider Note  ____________________________________________   First MD Initiated Contact with Patient 05/06/17 [phone]     (approximate)  I have reviewed the triage vital signs and the nursing notes.   HISTORY  Chief Complaint Foot Pain    HPI Haley Randall is a 81 y.o. female is here complaining of left foot pain.Patient states she has had pain for the last 4 days without any known injury. She denies any previous injury to her foot or ankle. Pain is worse with weightbearing. She rates her pain as a 5/10.   Past Medical History:  Diagnosis Date  . Hyperlipidemia   . Hypertension     Patient Active Problem List   Diagnosis Date Noted  . Bilateral carotid bruits 08/18/2016  . Essential hypertension 11/08/2015  . HLD (hyperlipidemia) 11/08/2015    Past Surgical History:  Procedure Laterality Date  . BREAST SURGERY Left 1980   biopsy    Prior to Admission medications   Medication Sig Start Date End Date Taking? Authorizing Provider  aspirin EC 81 MG tablet Take 1 tablet (81 mg total) by mouth daily. 08/18/16   Lorre Munroe, NP  lisinopril (PRINIVIL,ZESTRIL) 20 MG tablet TAKE 1 TABLET (20 MG TOTAL) BY MOUTH DAILY. 02/18/17   Lorre Munroe, NP  meloxicam (MOBIC) 7.5 MG tablet Take 1 tablet (7.5 mg total) by mouth daily. 05/06/17 05/06/18  Tommi Rumps, PA-C  pravastatin (PRAVACHOL) 80 MG tablet TAKE 1 TABLET (80 MG TOTAL) BY MOUTH DAILY. 08/18/16   Lorre Munroe, NP    Allergies Patient has no known allergies.  Family History  Problem Relation Age of Onset  . Heart disease Mother   . Heart disease Father   . Stroke Maternal Grandmother   . Stroke Maternal Grandfather   . Heart disease Paternal Grandmother   . Heart disease Paternal Grandfather   . Cancer Neg Hx   . Diabetes Neg Hx     Social History Social History  Substance Use Topics  . Smoking status: Former Games developer  . Smokeless tobacco: Never  Used     Comment: quit 30 years ago  . Alcohol use 0.0 oz/week     Comment: rare    Review of Systems Constitutional: No fever/chills Cardiovascular: Denies chest pain. Respiratory: Denies shortness of breath. Musculoskeletal: Positive left foot pain. Skin: Negative for rash. Neurological: Negative for  focal weakness or numbness.   ____________________________________________   PHYSICAL EXAM:  VITAL SIGNS: ED Triage Vitals [05/06/17 0823]  Enc Vitals Group     BP (!) 168/63     Pulse Rate 90     Resp 18     Temp 97.8 F (36.6 C)     Temp Source Oral     SpO2 99 %     Weight 117 lb (53.1 kg)     Height 4\' 11"  (1.499 m)     Head Circumference      Peak Flow      Pain Score 5     Pain Loc      Pain Edu?      Excl. in GC?     Constitutional: Alert and oriented. Well appearing and in no acute distress. Eyes: Conjunctivae are normal. PERRL. EOMI. Head: Atraumatic. Neck: No stridor.   Cardiovascular: Normal rate, regular rhythm. Grossly normal heart sounds.  Good peripheral circulation. Respiratory: Normal respiratory effort.  No retractions. Lungs CTAB. Musculoskeletal: On examination of the left foot patient has a hammertoe  of the second digit, First digit is deviated laterally with a bunion present at the first MP joint.. There is some minimal soft tissue swelling present lateral malleolus and tenderness on the proximal small bowel aspect of the fifth metatarsal. No ecchymosis, erythema or abrasions were noted. Motor sensory function intact. Neurologic:  Normal speech and language. No gross focal neurologic deficits are appreciated. No gait instability. Skin:  Skin is warm, dry and intact. No rash noted. Psychiatric: Mood and affect are normal. Speech and behavior are normal.  ____________________________________________   LABS (all labs ordered are listed, but only abnormal results are displayed)  Labs Reviewed - No data to display   RADIOLOGY  Dg Foot  Complete Left  Result Date: 05/06/2017 CLINICAL DATA:  81 year old female who awoke with foot swelling 4 days ago. EXAM: LEFT FOOT - COMPLETE 3+ VIEW COMPARISON:  None. FINDINGS: Hallux valgus and metatarsus primus varus with joint space loss and moderate osteophytosis about the left first MTP joint. Other metatarsals appear normally aligned. No phalanx fracture or dislocation identified. Midfoot tarsal bones appear intact and normally aligned with normal joint spaces. Calcaneus appears intact. Bone mineralization is within normal limits for age. No radiopaque foreign body identified. No subcutaneous gas. IMPRESSION: 1. Hallux valgus and metatarsus primus varus with first MTP joint degeneration. 2. Otherwise normal for age radiographic appearance of the left foot. Electronically Signed   By: Odessa FlemingH  Hall M.D.   On: 05/06/2017 09:06    ____________________________________________   PROCEDURES  Procedure(s) performed: None  Procedures  Critical Care performed: No  ____________________________________________   INITIAL IMPRESSION / ASSESSMENT AND PLAN / ED COURSE  Pertinent labs & imaging results that were available during my care of the patient were reviewed by me and considered in my medical decision making (see chart for details).  Patient was given prescription for meloxicam 7.5 one daily with food. She is aware that she should discontinue taking this medication if she gets any stomach issues. She will follow-up with her PCP if any continued problems with her foot. She is encouraged to elevate her foot and wear her postop shoe and walking. Patient was given information about her x-ray findings.    ____________________________________________   FINAL CLINICAL IMPRESSION(S) / ED DIAGNOSES  Final diagnoses:  Foot pain, right  Osteoarthritis of left foot, unspecified osteoarthritis type      NEW MEDICATIONS STARTED DURING THIS VISIT:  Discharge Medication List as of 05/06/2017  9:25 AM      START taking these medications   Details  meloxicam (MOBIC) 7.5 MG tablet Take 1 tablet (7.5 mg total) by mouth daily., Starting Wed 05/06/2017, Until Thu 05/06/2018, Print         Note:  This document was prepared using Dragon voice recognition software and may include unintentional dictation errors.    Tommi RumpsSummers, Rhonda L, PA-C 05/06/17 1417    Charlynne PanderYao, David Hsienta, MD 05/06/17 1520

## 2017-05-06 NOTE — ED Triage Notes (Signed)
Pt reports left foot pain x 4 days without known injury.

## 2017-05-06 NOTE — ED Notes (Signed)
Left medial ankle swollen, painful x 4 days. +2 pulse. Denies injury.

## 2017-05-06 NOTE — ED Notes (Signed)
Post-op shoe applied by nt kerry.

## 2017-08-20 ENCOUNTER — Encounter: Payer: Self-pay | Admitting: Internal Medicine

## 2017-08-20 ENCOUNTER — Ambulatory Visit (INDEPENDENT_AMBULATORY_CARE_PROVIDER_SITE_OTHER): Payer: Medicare Other | Admitting: Internal Medicine

## 2017-08-20 VITALS — BP 146/80 | HR 72 | Temp 98.2°F | Ht 59.0 in | Wt 134.0 lb

## 2017-08-20 DIAGNOSIS — E7800 Pure hypercholesterolemia, unspecified: Secondary | ICD-10-CM

## 2017-08-20 DIAGNOSIS — I10 Essential (primary) hypertension: Secondary | ICD-10-CM | POA: Diagnosis not present

## 2017-08-20 DIAGNOSIS — Z0000 Encounter for general adult medical examination without abnormal findings: Secondary | ICD-10-CM

## 2017-08-20 DIAGNOSIS — R040 Epistaxis: Secondary | ICD-10-CM | POA: Diagnosis not present

## 2017-08-20 LAB — CBC
HCT: 36.6 % (ref 36.0–46.0)
Hemoglobin: 11.6 g/dL — ABNORMAL LOW (ref 12.0–15.0)
MCHC: 31.7 g/dL (ref 30.0–36.0)
MCV: 83.8 fl (ref 78.0–100.0)
PLATELETS: 175 10*3/uL (ref 150.0–400.0)
RBC: 4.37 Mil/uL (ref 3.87–5.11)
RDW: 14.7 % (ref 11.5–15.5)
WBC: 5.6 10*3/uL (ref 4.0–10.5)

## 2017-08-20 LAB — COMPREHENSIVE METABOLIC PANEL
ALBUMIN: 4.1 g/dL (ref 3.5–5.2)
ALT: 14 U/L (ref 0–35)
AST: 19 U/L (ref 0–37)
Alkaline Phosphatase: 60 U/L (ref 39–117)
BILIRUBIN TOTAL: 0.9 mg/dL (ref 0.2–1.2)
BUN: 21 mg/dL (ref 6–23)
CALCIUM: 10.1 mg/dL (ref 8.4–10.5)
CO2: 29 mEq/L (ref 19–32)
CREATININE: 1.24 mg/dL — AB (ref 0.40–1.20)
Chloride: 108 mEq/L (ref 96–112)
GFR: 53.66 mL/min — AB (ref >60.00)
Glucose, Bld: 88 mg/dL (ref 70–99)
Potassium: 4.3 mEq/L (ref 3.5–5.1)
Sodium: 142 mEq/L (ref 135–145)
Total Protein: 7.2 g/dL (ref 6.0–8.3)

## 2017-08-20 LAB — LIPID PANEL
CHOLESTEROL: 174 mg/dL (ref 0–200)
HDL: 81.1 mg/dL (ref >39.00)
LDL Cholesterol: 76 mg/dL (ref 0–99)
NonHDL: 92.58
TRIGLYCERIDES: 85 mg/dL (ref 0.0–149.0)
Total CHOL/HDL Ratio: 2
VLDL: 17 mg/dL (ref 0.0–40.0)

## 2017-08-20 NOTE — Progress Notes (Signed)
HPI:  Pt presents to the clinic today for her Medicare Wellness Exam. She is also due for follow up of chronic conditions.  HTN: Her BP today was 146/80. She has been taking her Lisinopril daily as prescribed, but reports she has not taken it this morning. She denies adverse side effects. ECG from reviewed.  HLD: Her last LDL was 78, 08/2016. She is taking Pravastatin as prescribed. She denies myalgias. She consumes a low fat diet.  Past Medical History:  Diagnosis Date  . Hyperlipidemia   . Hypertension     Current Outpatient Prescriptions  Medication Sig Dispense Refill  . aspirin EC 81 MG tablet Take 1 tablet (81 mg total) by mouth daily. 90 tablet 3  . lisinopril (PRINIVIL,ZESTRIL) 20 MG tablet TAKE 1 TABLET (20 MG TOTAL) BY MOUTH DAILY. 90 tablet 1  . pravastatin (PRAVACHOL) 80 MG tablet TAKE 1 TABLET (80 MG TOTAL) BY MOUTH DAILY. 90 tablet 3   No current facility-administered medications for this visit.     No Known Allergies  Family History  Problem Relation Age of Onset  . Heart disease Mother   . Heart disease Father   . Stroke Maternal Grandmother   . Stroke Maternal Grandfather   . Heart disease Paternal Grandmother   . Heart disease Paternal Grandfather   . Cancer Neg Hx   . Diabetes Neg Hx     Social History   Social History  . Marital status: Married    Spouse name: N/A  . Number of children: N/A  . Years of education: N/A   Occupational History  . Not on file.   Social History Main Topics  . Smoking status: Former Games developer  . Smokeless tobacco: Never Used     Comment: quit 30 years ago  . Alcohol use 0.0 oz/week     Comment: rare  . Drug use: No  . Sexual activity: No   Other Topics Concern  . Not on file   Social History Narrative  . No narrative on file    Hospitiliaztions: None  Health Maintenance:    Flu: never  Tetanus: never  Pneumovax: never  Prevnar: never  Zostavax: never  Shingrix: never  Mammogram: 03/2014  Pap Smear: >  5 years ago  Bone Density: unsure  Colon Screening: ? 2011  [facility] Doctor[facility]  Dental Exam: as needed   Providers:   PCP: Nicki Reaper, NP-C   I have personally reviewed and have noted:  1. The patient's medical and social history 2. Their use of alcohol, tobacco or illicit drugs 3. Their current medications and supplements 4. The patient's functional ability including ADL's, fall risks, home safety risks and hearing or visual impairment. 5. Diet and physical activities 6. Evidence for depression or mood disorder  Subjective:   Review of Systems:   Constitutional: Denies fever, malaise, fatigue, headache or abrupt weight changes.  HEENT: Pt reports nose bleeds. Denies eye pain, eye redness, ear pain, ringing in the ears, wax buildup, runny nose, nasal congestion, bloody nose, or sore throat. Respiratory: Denies difficulty breathing, shortness of breath, cough or sputum production.   Cardiovascular: Denies chest pain, chest tightness, palpitations or swelling in the hands or feet.  Gastrointestinal: Denies abdominal pain, bloating, constipation, diarrhea or blood in the stool.  GU: Denies urgency, frequency, pain with urination, burning sensation, blood in urine, odor or discharge. Musculoskeletal: Denies decrease in range of motion, difficulty with gait, muscle pain or joint pain and swelling.  Skin:  Denies redness, rashes, lesions or ulcercations.  Neurological: Denies dizziness, difficulty with memory, difficulty with speech or problems with balance and coordination.  Psych: Denies anxiety, depression, SI/HI.  No other specific complaints in a complete review of systems (except as listed in HPI above).  Objective:  PE:   BP (!) 146/80   Pulse 72   Temp 98.2 F (36.8 C) (Oral)   Ht  (1.499 m)   Wt 134 lb (60.8 kg)   SpO2 99%   BMI 27.06 kg/m  Wt Readings from Last 3 Encounters:  08/20/17 134 lb (60.8 kg)  05/06/17 117 lb (53.1 kg)   08/18/16 117 lb 12 oz (53.4 kg)    General: Appears her stated age, in NAD. Skin: Warm, dry and intact. 1 cm nevus noted on left lower leg. HEENT: Nose: Mucosa pink and dry; Throat/Mouth: Teeth present, mucosa pink and moist, no exudate, lesions or ulcerations noted.  Cardiovascular: Normal rate and rhythm. S1,S2 noted.  No murmur, rubs or gallops noted. No JVD or BLE edema.  Pulmonary/Chest: Normal effort and positive vesicular breath sounds. No respiratory distress. No wheezes, rales or ronchi noted.  Neurological: Alert and oriented. Psychiatric: Mood and affect normal. Behavior is normal. Judgment and thought content normal.    BMET    Component Value Date/Time   NA 143 09/12/2016 0944   K 4.3 09/12/2016 0944   CL 108 09/12/2016 0944   CO2 29 09/12/2016 0944   GLUCOSE 98 09/12/2016 0944   BUN 14 09/12/2016 0944   CREATININE 1.27 (H) 09/12/2016 0944   CALCIUM 10.0 09/12/2016 0944    Lipid Panel     Component Value Date/Time   CHOL 154 08/18/2016 0900   TRIG 108.0 08/18/2016 0900   HDL 54.60 08/18/2016 0900   CHOLHDL 3 08/18/2016 0900   VLDL 21.6 08/18/2016 0900   LDLCALC 78 08/18/2016 0900    CBC    Component Value Date/Time   WBC 5.4 08/18/2016 0900   RBC 4.17 08/18/2016 0900   HGB 11.6 (L) 08/18/2016 0900   HCT 35.2 (L) 08/18/2016 0900   PLT 289.0 08/18/2016 0900   MCV 84.6 08/18/2016 0900   MCHC 32.9 08/18/2016 0900   RDW 13.5 08/18/2016 0900    Hgb A1C No results found for: HGBA1C    Assessment and Plan:   Medicare Annual Wellness Visit:  Diet: She eats lean meats. She consumes fruits and veggies daily. She tries to avoid fried foods. She drinks mostly water, coffee Physical activity: Sedentary Depression/mood screen: Negative Hearing: Intact to whispered voice Visual acuity: Grossly normal, performs annual eye exam  ADLs: Capable Fall risk: None Home safety: Good Cognitive evaluation: Intact to orientation, naming, recall and repetition EOL  planning: No adv directives (she does not want info), full code/ I agree  Preventative Medicine: She declines flu, tetanus, prevnar, pneumovax, zostovax and shingrix. She declines pap smears, mammograms, bone density or colon cancer screening. Encouraged her to consume a balanced diet and exercise regimen. Advised her to see an eye doctor and dentist annually. Will check CBC, CMET and Lipid profile.  Nose Bleeds:  Advised her to try using nasal Saline spray once daily   Next appointment: 1 year, Medicare Wellness Exam   Nicki Reaper, NP

## 2017-08-20 NOTE — Assessment & Plan Note (Signed)
CMET and Lipid profile today Encouraged her to consume a low fat diet. Continue Pravastatin for now 

## 2017-08-20 NOTE — Assessment & Plan Note (Signed)
Slightly elevated but she has not taken her meds today Continue Lisinopril for now

## 2017-08-20 NOTE — Patient Instructions (Signed)
Health Maintenance for Postmenopausal Women Menopause is a normal process in which your reproductive ability comes to an end. This process happens gradually over a span of months to years, usually between the ages of 22 and 9. Menopause is complete when you have missed 12 consecutive menstrual periods. It is important to talk with your health care provider about some of the most common conditions that affect postmenopausal women, such as heart disease, cancer, and bone loss (osteoporosis). Adopting a healthy lifestyle and getting preventive care can help to promote your health and wellness. Those actions can also lower your chances of developing some of these common conditions. What should I know about menopause? During menopause, you may experience a number of symptoms, such as:  Moderate-to-severe hot flashes.  Night sweats.  Decrease in sex drive.  Mood swings.  Headaches.  Tiredness.  Irritability.  Memory problems.  Insomnia.  Choosing to treat or not to treat menopausal changes is an individual decision that you make with your health care provider. What should I know about hormone replacement therapy and supplements? Hormone therapy products are effective for treating symptoms that are associated with menopause, such as hot flashes and night sweats. Hormone replacement carries certain risks, especially as you become older. If you are thinking about using estrogen or estrogen with progestin treatments, discuss the benefits and risks with your health care provider. What should I know about heart disease and stroke? Heart disease, heart attack, and stroke become more likely as you age. This may be due, in part, to the hormonal changes that your body experiences during menopause. These can affect how your body processes dietary fats, triglycerides, and cholesterol. Heart attack and stroke are both medical emergencies. There are many things that you can do to help prevent heart disease  and stroke:  Have your blood pressure checked at least every 1-2 years. High blood pressure causes heart disease and increases the risk of stroke.  If you are 53-22 years old, ask your health care provider if you should take aspirin to prevent a heart attack or a stroke.  Do not use any tobacco products, including cigarettes, chewing tobacco, or electronic cigarettes. If you need help quitting, ask your health care provider.  It is important to eat a healthy diet and maintain a healthy weight. ? Be sure to include plenty of vegetables, fruits, low-fat dairy products, and lean protein. ? Avoid eating foods that are high in solid fats, added sugars, or salt (sodium).  Get regular exercise. This is one of the most important things that you can do for your health. ? Try to exercise for at least 150 minutes each week. The type of exercise that you do should increase your heart rate and make you sweat. This is known as moderate-intensity exercise. ? Try to do strengthening exercises at least twice each week. Do these in addition to the moderate-intensity exercise.  Know your numbers.Ask your health care provider to check your cholesterol and your blood glucose. Continue to have your blood tested as directed by your health care provider.  What should I know about cancer screening? There are several types of cancer. Take the following steps to reduce your risk and to catch any cancer development as early as possible. Breast Cancer  Practice breast self-awareness. ? This means understanding how your breasts normally appear and feel. ? It also means doing regular breast self-exams. Let your health care provider know about any changes, no matter how small.  If you are 40  or older, have a clinician do a breast exam (clinical breast exam or CBE) every year. Depending on your age, family history, and medical history, it may be recommended that you also have a yearly breast X-ray (mammogram).  If you  have a family history of breast cancer, talk with your health care provider about genetic screening.  If you are at high risk for breast cancer, talk with your health care provider about having an MRI and a mammogram every year.  Breast cancer (BRCA) gene test is recommended for women who have family members with BRCA-related cancers. Results of the assessment will determine the need for genetic counseling and BRCA1 and for BRCA2 testing. BRCA-related cancers include these types: ? Breast. This occurs in males or females. ? Ovarian. ? Tubal. This may also be called fallopian tube cancer. ? Cancer of the abdominal or pelvic lining (peritoneal cancer). ? Prostate. ? Pancreatic.  Cervical, Uterine, and Ovarian Cancer Your health care provider may recommend that you be screened regularly for cancer of the pelvic organs. These include your ovaries, uterus, and vagina. This screening involves a pelvic exam, which includes checking for microscopic changes to the surface of your cervix (Pap test).  For women ages 21-65, health care providers may recommend a pelvic exam and a Pap test every three years. For women ages 79-65, they may recommend the Pap test and pelvic exam, combined with testing for human papilloma virus (HPV), every five years. Some types of HPV increase your risk of cervical cancer. Testing for HPV may also be done on women of any age who have unclear Pap test results.  Other health care providers may not recommend any screening for nonpregnant women who are considered low risk for pelvic cancer and have no symptoms. Ask your health care provider if a screening pelvic exam is right for you.  If you have had past treatment for cervical cancer or a condition that could lead to cancer, you need Pap tests and screening for cancer for at least 20 years after your treatment. If Pap tests have been discontinued for you, your risk factors (such as having a new sexual partner) need to be  reassessed to determine if you should start having screenings again. Some women have medical problems that increase the chance of getting cervical cancer. In these cases, your health care provider may recommend that you have screening and Pap tests more often.  If you have a family history of uterine cancer or ovarian cancer, talk with your health care provider about genetic screening.  If you have vaginal bleeding after reaching menopause, tell your health care provider.  There are currently no reliable tests available to screen for ovarian cancer.  Lung Cancer Lung cancer screening is recommended for adults 69-62 years old who are at high risk for lung cancer because of a history of smoking. A yearly low-dose CT scan of the lungs is recommended if you:  Currently smoke.  Have a history of at least 30 pack-years of smoking and you currently smoke or have quit within the past 15 years. A pack-year is smoking an average of one pack of cigarettes per day for one year.  Yearly screening should:  Continue until it has been 15 years since you quit.  Stop if you develop a health problem that would prevent you from having lung cancer treatment.  Colorectal Cancer  This type of cancer can be detected and can often be prevented.  Routine colorectal cancer screening usually begins at  age 42 and continues through age 45.  If you have risk factors for colon cancer, your health care provider may recommend that you be screened at an earlier age.  If you have a family history of colorectal cancer, talk with your health care provider about genetic screening.  Your health care provider may also recommend using home test kits to check for hidden blood in your stool.  A small camera at the end of a tube can be used to examine your colon directly (sigmoidoscopy or colonoscopy). This is done to check for the earliest forms of colorectal cancer.  Direct examination of the colon should be repeated every  5-10 years until age 71. However, if early forms of precancerous polyps or small growths are found or if you have a family history or genetic risk for colorectal cancer, you may need to be screened more often.  Skin Cancer  Check your skin from head to toe regularly.  Monitor any moles. Be sure to tell your health care provider: ? About any new moles or changes in moles, especially if there is a change in a mole's shape or color. ? If you have a mole that is larger than the size of a pencil eraser.  If any of your family members has a history of skin cancer, especially at a young age, talk with your health care provider about genetic screening.  Always use sunscreen. Apply sunscreen liberally and repeatedly throughout the day.  Whenever you are outside, protect yourself by wearing long sleeves, pants, a wide-brimmed hat, and sunglasses.  What should I know about osteoporosis? Osteoporosis is a condition in which bone destruction happens more quickly than new bone creation. After menopause, you may be at an increased risk for osteoporosis. To help prevent osteoporosis or the bone fractures that can happen because of osteoporosis, the following is recommended:  If you are 46-71 years old, get at least 1,000 mg of calcium and at least 600 mg of vitamin D per day.  If you are older than age 55 but younger than age 65, get at least 1,200 mg of calcium and at least 600 mg of vitamin D per day.  If you are older than age 54, get at least 1,200 mg of calcium and at least 800 mg of vitamin D per day.  Smoking and excessive alcohol intake increase the risk of osteoporosis. Eat foods that are rich in calcium and vitamin D, and do weight-bearing exercises several times each week as directed by your health care provider. What should I know about how menopause affects my mental health? Depression may occur at any age, but it is more common as you become older. Common symptoms of depression  include:  Low or sad mood.  Changes in sleep patterns.  Changes in appetite or eating patterns.  Feeling an overall lack of motivation or enjoyment of activities that you previously enjoyed.  Frequent crying spells.  Talk with your health care provider if you think that you are experiencing depression. What should I know about immunizations? It is important that you get and maintain your immunizations. These include:  Tetanus, diphtheria, and pertussis (Tdap) booster vaccine.  Influenza every year before the flu season begins.  Pneumonia vaccine.  Shingles vaccine.  Your health care provider may also recommend other immunizations. This information is not intended to replace advice given to you by your health care provider. Make sure you discuss any questions you have with your health care provider. Document Released: 01/09/2006  Document Revised: 06/06/2016 Document Reviewed: 08/21/2015 Elsevier Interactive Patient Education  2018 [employer]  

## 2017-08-21 ENCOUNTER — Other Ambulatory Visit: Payer: Self-pay | Admitting: Internal Medicine

## 2017-08-22 ENCOUNTER — Other Ambulatory Visit: Payer: Self-pay | Admitting: Internal Medicine

## 2017-09-25 ENCOUNTER — Other Ambulatory Visit: Payer: Self-pay | Admitting: *Deleted

## 2017-09-25 DIAGNOSIS — I6523 Occlusion and stenosis of bilateral carotid arteries: Secondary | ICD-10-CM

## 2017-11-03 ENCOUNTER — Emergency Department
Admission: EM | Admit: 2017-11-03 | Discharge: 2017-11-03 | Disposition: A | Discharge status: Home / Self Care | Payer: Medicare Other | Attending: Emergency Medicine | Admitting: Emergency Medicine

## 2017-11-03 ENCOUNTER — Encounter: Payer: Self-pay | Admitting: Emergency Medicine

## 2017-11-03 ENCOUNTER — Telehealth: Payer: Self-pay

## 2017-11-03 DIAGNOSIS — Z79899 Other long term (current) drug therapy: Secondary | ICD-10-CM | POA: Insufficient documentation

## 2017-11-03 DIAGNOSIS — R040 Epistaxis: Secondary | ICD-10-CM | POA: Insufficient documentation

## 2017-11-03 DIAGNOSIS — Z87891 Personal history of nicotine dependence: Secondary | ICD-10-CM | POA: Diagnosis not present

## 2017-11-03 DIAGNOSIS — I10 Essential (primary) hypertension: Secondary | ICD-10-CM | POA: Insufficient documentation

## 2017-11-03 DIAGNOSIS — Z7982 Long term (current) use of aspirin: Secondary | ICD-10-CM | POA: Diagnosis not present

## 2017-11-03 MED ORDER — LISINOPRIL 10 MG PO TABS
20.0000 mg | ORAL_TABLET | Freq: Once | ORAL | Status: AC
Start: 2017-11-03 — End: 2017-11-03
  Administered 2017-11-03: 20 mg via ORAL
  Filled 2017-11-03: qty 2

## 2017-11-03 NOTE — ED Provider Notes (Signed)
[facility] Emergency Department Provider Note  ____________________________________________   First MD Initiated Contact with Patient 11/03/17 1048     (approximate)  I have reviewed the triage vital signs and the nursing notes.   HISTORY  Chief Complaint Epistaxis    HPI Haley Randall is a 81 y.o. female planes of a nosebleed that started this morning, states she gets these regularly but today it was heavier than normal, she put tissue in her nose and it seems to have gotten better, she denies a headache, she did not take her blood pressure medicine this morning, she denies chest pain or shortness of breath, she has taken an aspirin a day   Past Medical History:  Diagnosis Date  . Hyperlipidemia   . Hypertension     Patient Active Problem List   Diagnosis Date Noted  . Bilateral carotid bruits 08/18/2016  . Essential hypertension 11/08/2015  . HLD (hyperlipidemia) 11/08/2015    Past Surgical History:  Procedure Laterality Date  . BREAST SURGERY Left 1980   biopsy    Prior to Admission medications   Medication Sig Start Date End Date Taking? Authorizing Provider  aspirin EC 81 MG tablet Take 1 tablet (81 mg total) by mouth daily. 08/18/16   Lorre MunroeBaity, Regina W, NP  lisinopril (PRINIVIL,ZESTRIL) 20 MG tablet TAKE 1 TABLET (20 MG TOTAL) BY MOUTH DAILY. 08/21/17   Lorre MunroeBaity, Regina W, NP  pravastatin (PRAVACHOL) 80 MG tablet TAKE 1 TABLET (80 MG TOTAL) BY MOUTH DAILY. 08/24/17   Lorre MunroeBaity, Regina W, NP    Allergies Patient has no known allergies.  Family History  Problem Relation Age of Onset  . Heart disease Mother   . Heart disease Father   . Stroke Maternal Grandmother   . Stroke Maternal Grandfather   . Heart disease Paternal Grandmother   . Heart disease Paternal Grandfather   . Cancer Neg Hx   . Diabetes Neg Hx     Social History Social History   Tobacco Use  . Smoking status: Former Games developermoker  . Smokeless tobacco: Never Used  . Tobacco  comment: quit 30 years ago  Substance Use Topics  . Alcohol use: Yes    Alcohol/week: 0.0 oz    Comment: rare  . Drug use: No    Review of Systems  Constitutional: No fever/chills Eyes: No visual changes. ENT: No sore throat. Positive nosebleed Respiratory: Denies cough Genitourinary: Negative for dysuria. Musculoskeletal: Negative for back pain. Skin: Negative for rash.    ____________________________________________   PHYSICAL EXAM:  VITAL SIGNS: ED Triage Vitals  Enc Vitals Group     BP 11/03/17 1012 (!) 186/56     Pulse Rate 11/03/17 1012 65     Resp 11/03/17 1012 18     Temp 11/03/17 1012 98.1 F (36.7 C)     Temp Source 11/03/17 1012 Oral     SpO2 11/03/17 1012 100 %     Weight 11/03/17 1015 140 lb (63.5 kg)     Height 11/03/17 1015 4\' 11"  (1.499 m)     Head Circumference --      Peak Flow --      Pain Score 11/03/17 1014 0     Pain Loc --      Pain Edu? --      Excl. in GC? --     Constitutional: Alert and oriented. Well appearing and in no acute distress. Eyes: Conjunctivae are normal.  Head: Atraumatic. Nose: No congestion/rhinnorhea. Patient's toilet tissue from home  was removed, the nare, there is no blood clot or bleeding in the nasal cavity at this time, the nasal mucosa is a little swollen Mouth/Throat: Mucous membranes are moist.  There is no blood in the posterior throat Cardiovascular: Normal rate, regular rhythm. Sounds are normal Respiratory: Normal respiratory effort.  No retractions, lungs are clear to auscultation GU: deferred Musculoskeletal: FROM all extremities, warm and well perfused Neurologic:  Normal speech and language.  Skin:  Skin is warm, dry and intact. No rash noted. Psychiatric: Mood and affect are normal. Speech and behavior are normal.  ____________________________________________   LABS (all labs ordered are listed, but only abnormal results are displayed)  Labs Reviewed - No data to  display ____________________________________________   ____________________________________________  RADIOLOGY    ____________________________________________   PROCEDURES  Procedure(s) performed: No      ____________________________________________   INITIAL IMPRESSION / ASSESSMENT AND PLAN / ED COURSE  Pertinent labs & imaging results that were available during my care of the patient were reviewed by me and considered in my medical decision making (see chart for details).  Patient is a 81 year old female with the resolved epistaxis, she was given instructions on using Vaseline in the nasal cavity at night to prevent dryness, she is not to blow her nose hard, she is not to pick her nose, if the bleeding becomes worse she should see ENT or return to the emergency department      ____________________________________________   FINAL CLINICAL IMPRESSION(S) / ED DIAGNOSES  Final diagnoses:  Anterior epistaxis      NEW MEDICATIONS STARTED DURING THIS VISIT:  This SmartLink is deprecated. Use AVSMEDLIST instead to display the medication list for a patient.   Note:  This document was prepared using Dragon voice recognition software and may include unintentional dictation errors.    Faythe GheeFisher, Susan W, PA-C 11/03/17 1601    Emily FilbertWilliams, Jonathan E, MD 11/04/17 [phone]

## 2017-11-03 NOTE — ED Triage Notes (Addendum)
Patient presents to the ED with a nosebleed this morning that lasted longer than usual.  Patient states nosebleed began around 7:30am.  Patient states, "it was bleeding a lot more than normal, just gushing."  Patient has toilet paper in her nose at this time and bleeding appears to be controlled.  Patient is in no obvious distress at this time.  Patient denies dizziness or light headedness.  Patient reports history of HTN and states she has not yet taken her bp medication this morning.

## 2017-11-03 NOTE — Telephone Encounter (Signed)
Pt walked in with her rt nostril packed; no evidence of bleeding but pt said when holds head back or lays down can taste blood; pt woke up today 7 AM with nose bleed. Does not appear actively bleeding now but pt said when takes out packing slimy clot comes out and then nose starts bleeding again; no H/a or dizziness. Has not missed any BP meds; has not taken lisinopril today.last nosebleed one mth ago. NP now 148/68 LA reg cuff. [facility] pt does not want to go to another [facility] site; Pamala Hurry Baity NP suggest UC. [facility] FYI to Pamala Hurry Baity NP. Pt will cb later to schedule f/u with Pamala Hurry Baity NP.

## 2017-11-03 NOTE — Discharge Instructions (Signed)
Follow-up with your regular doctor if you're not better in 3-5 days, return to the emergency department if you're nose bleed lasts more than 20 minutes while you are applying pressure, use Vaseline in the nose every night to keep the nose from drying out, to not pick at her nose or blow your nose hard

## 2017-11-03 NOTE — ED Notes (Signed)
BP repeated manually on R arm. 212/82. Susan,PA and Linda,RN made aware of this finding.

## 2017-11-08 IMAGING — DX DG FOOT COMPLETE 3+V*L*
3 series · 3 of 3 positions shown · non-contrast
Comparison: None.

CLINICAL DATA: 80-year-old female who awoke with foot swelling 4
days ago.

EXAM:
LEFT FOOT - COMPLETE 3+ VIEW

[foot ap]
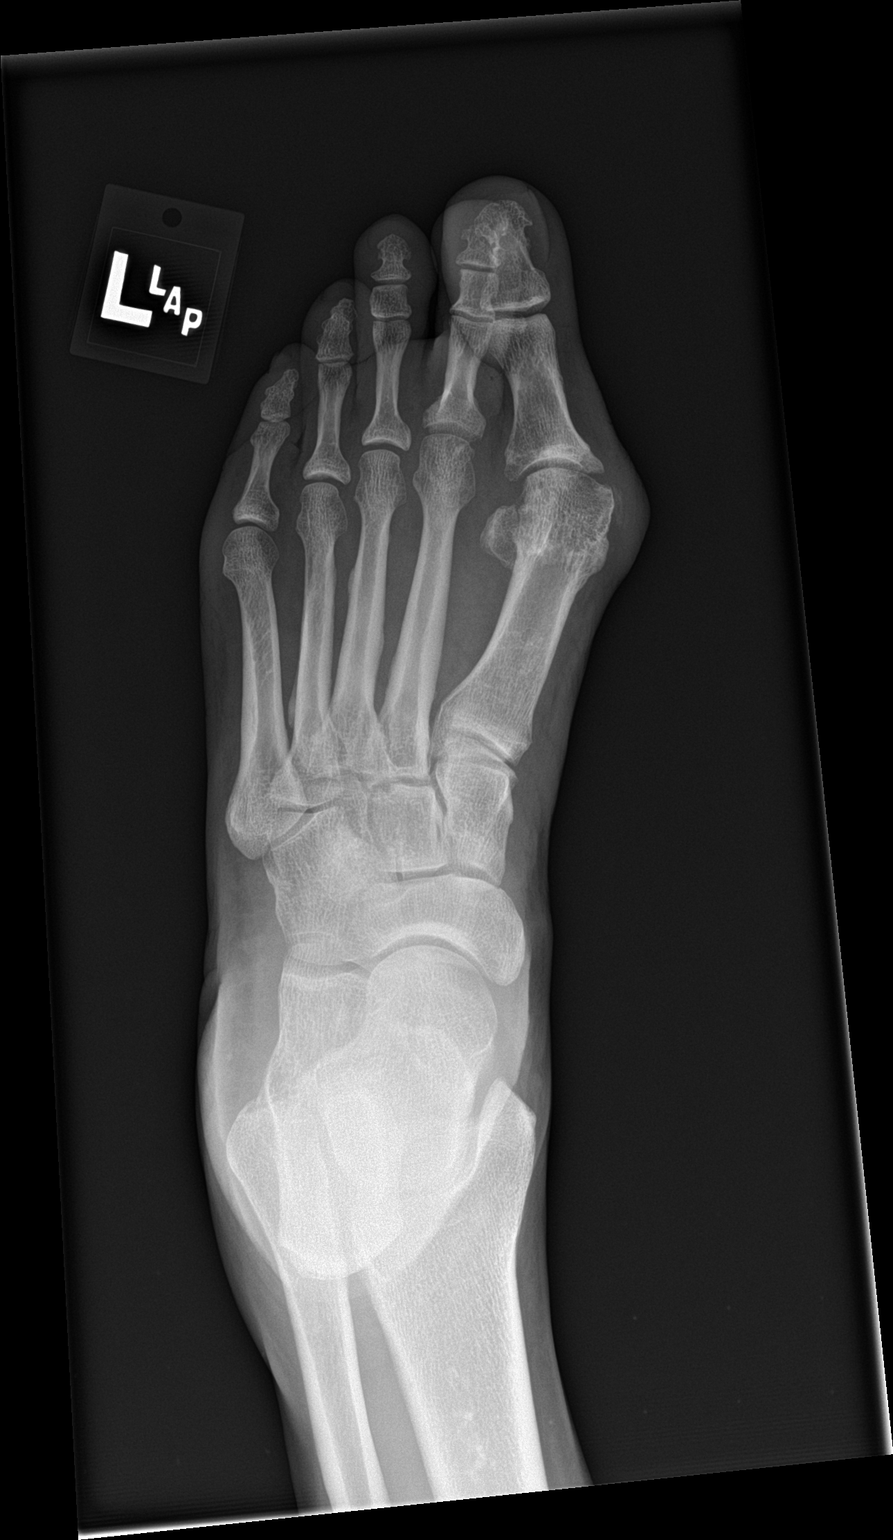

[foot obl]
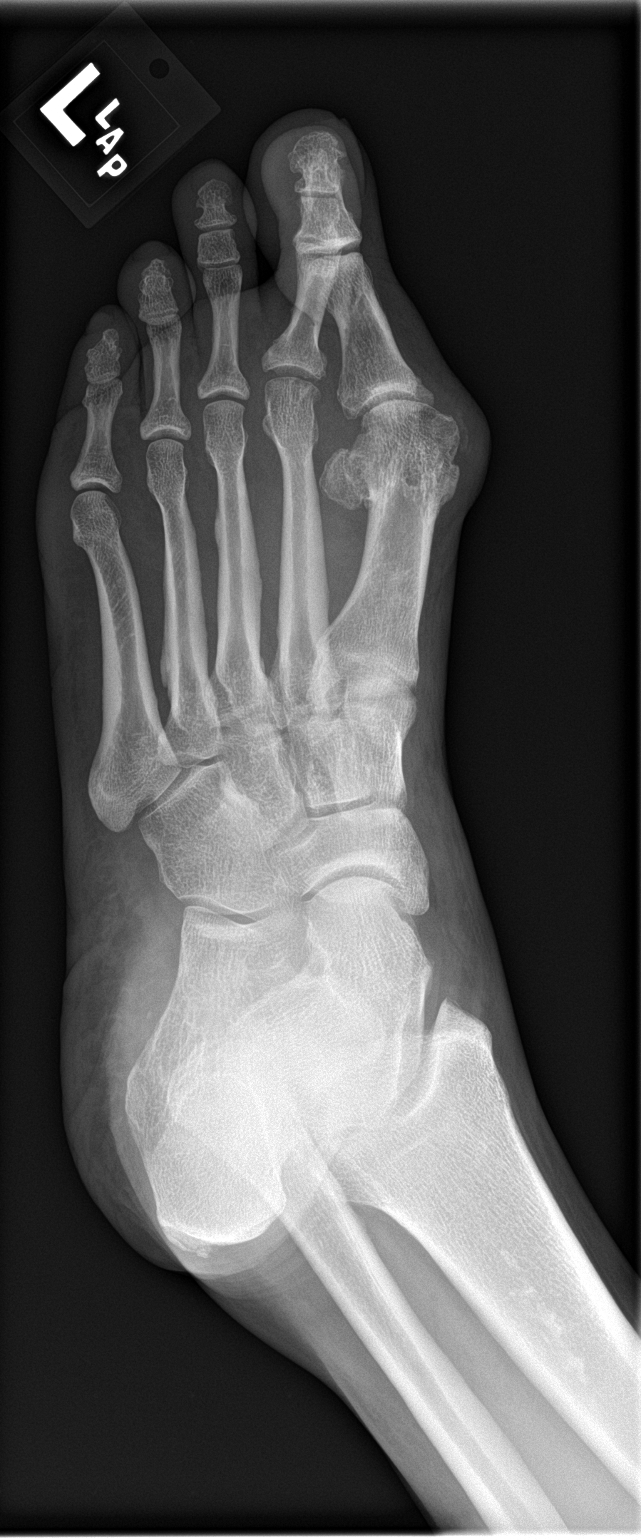

[foot lat]
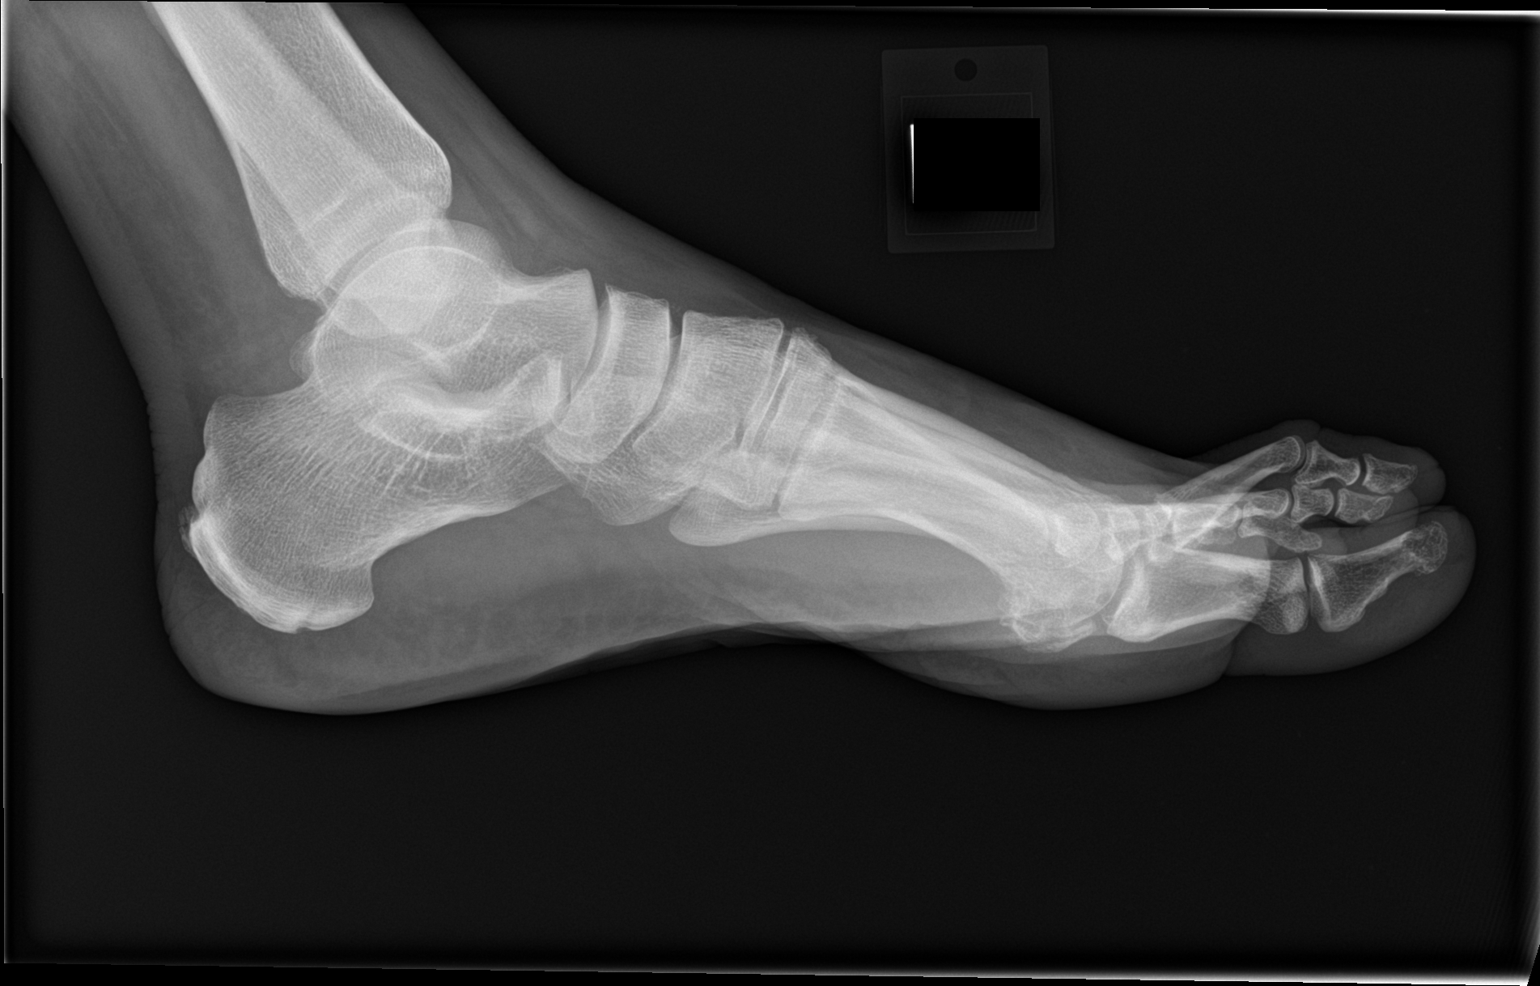

[3 of 3 positions shown; findings below may reference images not displayed]

FINDINGS: Hallux valgus and metatarsus primus varus with joint space loss and
moderate osteophytosis about the left first MTP joint. Other
metatarsals appear normally aligned. No phalanx fracture or
dislocation identified. Midfoot tarsal bones appear intact and
normally aligned with normal joint spaces. Calcaneus appears intact.
Bone mineralization is within normal limits for age. No radiopaque
foreign body identified. No subcutaneous gas.
IMPRESSION: 1. Hallux valgus and metatarsus primus varus with first MTP joint
degeneration.
2. Otherwise normal for age radiographic appearance of the left
foot.

## 2018-03-24 ENCOUNTER — Other Ambulatory Visit: Payer: Self-pay | Admitting: Internal Medicine

## 2018-08-06 ENCOUNTER — Other Ambulatory Visit: Payer: Self-pay

## 2018-08-06 ENCOUNTER — Emergency Department
Admission: EM | Admit: 2018-08-06 | Discharge: 2018-08-06 | Disposition: A | Discharge status: Home / Self Care | Payer: Medicare Other | Attending: Emergency Medicine | Admitting: Emergency Medicine

## 2018-08-06 ENCOUNTER — Encounter: Payer: Self-pay | Admitting: Emergency Medicine

## 2018-08-06 ENCOUNTER — Emergency Department: Payer: Medicare Other

## 2018-08-06 DIAGNOSIS — E785 Hyperlipidemia, unspecified: Secondary | ICD-10-CM | POA: Diagnosis not present

## 2018-08-06 DIAGNOSIS — Z79899 Other long term (current) drug therapy: Secondary | ICD-10-CM | POA: Diagnosis not present

## 2018-08-06 DIAGNOSIS — M79671 Pain in right foot: Secondary | ICD-10-CM

## 2018-08-06 DIAGNOSIS — M7989 Other specified soft tissue disorders: Secondary | ICD-10-CM | POA: Diagnosis not present

## 2018-08-06 DIAGNOSIS — I10 Essential (primary) hypertension: Secondary | ICD-10-CM | POA: Insufficient documentation

## 2018-08-06 DIAGNOSIS — Z87891 Personal history of nicotine dependence: Secondary | ICD-10-CM | POA: Diagnosis not present

## 2018-08-06 MED ORDER — PREDNISONE 10 MG PO TABS: ORAL_TABLET | ORAL | 0 refills | Status: DC

## 2018-08-06 NOTE — Discharge Instructions (Signed)
Follow-up with your primary care provider if any continued problems.  Ice and elevate if needed for pain.  Begin taking prednisone 3 tablets once a day for the next 5 days.  This medication may cause a temporary disturbance in sleep or cause increased appetite.  This will subside when you discontinue taking the medication.  Wear good supportive shoes when up walking.

## 2018-08-06 NOTE — ED Triage Notes (Signed)
Pt presents with right foot pain that began on Tuesday. Denies injury or trauma. States she cannot walk on it at all. Pt alert & oriented with NAD noted.

## 2018-08-06 NOTE — ED Provider Notes (Signed)
[facility] Emergency Department Provider Note   ____________________________________________   First MD Initiated Contact with Patient 08/06/18 [phone]     (approximate)  I have reviewed the triage vital signs and the nursing notes.   HISTORY  Chief Complaint Foot Pain   HPI Haley Randall is a 82 y.o. female Modena Jansky to the emergency department with complaint of right foot pain that started 3 days ago.  She states that pain moves from the medial aspect of her foot to her lateral malleolus.  Patient is unaware of any injury and states that pain is increased with standing.  She has not taken any over-the-counter medication for her pain.  She denies any past history of trauma to her foot.  She rates her pain as 9/10.  Past Medical History:  Diagnosis Date  . Hyperlipidemia   . Hypertension     Patient Active Problem List   Diagnosis Date Noted  . Bilateral carotid bruits 08/18/2016  . Essential hypertension 11/08/2015  . HLD (hyperlipidemia) 11/08/2015    Past Surgical History:  Procedure Laterality Date  . BREAST SURGERY Left 1980   biopsy    Prior to Admission medications   Medication Sig Start Date End Date Taking? Authorizing Provider  lisinopril (PRINIVIL,ZESTRIL) 20 MG tablet TAKE 1 TABLET (20 MG TOTAL) BY MOUTH DAILY. 03/24/18   Lorre Munroe, NP  predniSONE (DELTASONE) 10 MG tablet Take 3 tablets once a day with food for 5 days 08/06/18   Tommi Rumps, PA-C    Allergies Patient has no known allergies.  Family History  Problem Relation Age of Onset  . Heart disease Mother   . Heart disease Father   . Stroke Maternal Grandmother   . Stroke Maternal Grandfather   . Heart disease Paternal Grandmother   . Heart disease Paternal Grandfather   . Cancer Neg Hx   . Diabetes Neg Hx     Social History Social History   Tobacco Use  . Smoking status: Former Games developer  . Smokeless tobacco: Never Used  . Tobacco comment: quit 30 years ago    Substance Use Topics  . Alcohol use: Yes    Alcohol/week: 0.0 standard drinks    Comment: rare  . Drug use: No    Review of Systems Constitutional: No fever/chills Cardiovascular: Denies chest pain. Respiratory: Denies shortness of breath. Gastrointestinal: No abdominal pain.  No nausea, no vomiting.  Musculoskeletal: Positive right foot pain. Skin: No complaints. Neurological: Negative for headaches, focal weakness or numbness. ___________________________________________   PHYSICAL EXAM:  VITAL SIGNS: ED Triage Vitals  Enc Vitals Group     BP 08/06/18 0748 (!) 176/55     Pulse Rate 08/06/18 0748 87     Resp 08/06/18 0748 16     Temp 08/06/18 0748 98.5 F (36.9 C)     Temp Source 08/06/18 0748 Oral     SpO2 08/06/18 0748 99 %     Weight 08/06/18 0749 130 lb (59 kg)     Height 08/06/18 0749 4\' 11"  (1.499 m)     Head Circumference --      Peak Flow --      Pain Score 08/06/18 0748 9     Pain Loc --      Pain Edu? --      Excl. in GC? --    Constitutional: Alert and oriented. Well appearing and in no acute distress. Eyes: Conjunctivae are normal.  Head: Atraumatic. Neck: No stridor.   Cardiovascular: Normal  rate, regular rhythm. Grossly normal heart sounds.  Good peripheral circulation. Respiratory: Normal respiratory effort.  No retractions. Lungs CTAB. Musculoskeletal: Examination of the right foot there is chronic second toe chronic deformity as well as a hallux valgus deformity of the first digit.  There is no soft tissue edema present.  No ecchymosis or abrasions were seen.  There is diffuse tenderness on palpation of the dorsal aspect and lateral malleolus.  Range of motion is unrestricted.  Skin is intact.  Pulse present.  Good muscle strength. Neurologic:  Normal speech and language. No gross focal neurologic deficits are appreciated. No gait instability. Skin:  Skin is warm, dry and intact. No rash noted. Psychiatric: Mood and affect are normal. Speech and  behavior are normal.  ____________________________________________   LABS (all labs ordered are listed, but only abnormal results are displayed)  Labs Reviewed - No data to display RADIOLOGY  ED MD interpretation:  X-ray right foot is negative for acute bony injury.  Official radiology report(s): Dg Foot Complete Right  Result Date: 08/06/2018 CLINICAL DATA:  Pain and swelling in foot since Tuesday. Radiating pain to ankle. Difficult to walk. No known injury. EXAM: RIGHT FOOT COMPLETE - 3+ VIEW COMPARISON:  None. FINDINGS: Chronic hallux valgus deformity at the first MTP joint, with overlying soft tissue thickening suggesting bunion and/or bursitis. Associated degenerative spurring at the joint space. No acute appearing osseous abnormality. No erosions or other signs of an inflammatory arthritis. Soft tissues about the RIGHT foot are otherwise unremarkable. IMPRESSION: 1. No acute findings. 2. Chronic hallux valgus deformity at the first MTP joint, with associated degenerative spurring, and with overlying soft tissue prominence suggesting bunion and/or bursitis. Electronically Signed   By: Bary Richard M.D.   On: 08/06/2018 09:05    ____________________________________________   PROCEDURES  Procedure(s) performed: None  Procedures  Critical Care performed: No  ____________________________________________   INITIAL IMPRESSION / ASSESSMENT AND PLAN / ED COURSE  As part of my medical decision making, I reviewed the following data within the electronic MEDICAL RECORD NUMBER Notes from prior ED visits and [place] Controlled Substance Database  82 year old female comes today with complaint of right foot pain for the last 3 days.  Patient denies any history of injury.  Patient states this morning that pain is increased with walking.  She denies any previous injury to her foot.  X-rays were reassuring that there was no bony injury.  Patient has chronic changes on her x-ray and on physical exam.   Patient was given a prescription for prednisone 3 tablets daily for the next 5 days.  She is to follow-up with her PCP if any continued problems.  She is also encouraged to wear some supportive shoes, ice and elevate as needed for pain control.  ____________________________________________   FINAL CLINICAL IMPRESSION(S) / ED DIAGNOSES  Final diagnoses:  Acute pain of right foot     ED Discharge Orders         Ordered    predniSONE (DELTASONE) 10 MG tablet  Status:  Discontinued     08/06/18 0925    predniSONE (DELTASONE) 10 MG tablet     08/06/18 1610           Note:  This document was prepared using Dragon voice recognition software and may include unintentional dictation errors.    Tommi Rumps, PA-C 08/06/18 1100    Jene Every, MD 08/06/18 [id number]

## 2018-08-06 NOTE — ED Notes (Signed)
See triage note.  States she has had pain to top of right foot for a while  States now the pain is lateral foot  Denies any injury  Having increased pain with standing

## 2018-08-24 ENCOUNTER — Ambulatory Visit (INDEPENDENT_AMBULATORY_CARE_PROVIDER_SITE_OTHER): Payer: Medicare Other | Admitting: Internal Medicine

## 2018-08-24 ENCOUNTER — Encounter: Payer: Self-pay | Admitting: Internal Medicine

## 2018-08-24 VITALS — BP 162/84 | HR 76 | Temp 98.3°F | Ht <= 58 in | Wt 126.0 lb

## 2018-08-24 DIAGNOSIS — Z0000 Encounter for general adult medical examination without abnormal findings: Secondary | ICD-10-CM

## 2018-08-24 DIAGNOSIS — E7800 Pure hypercholesterolemia, unspecified: Secondary | ICD-10-CM

## 2018-08-24 DIAGNOSIS — M79671 Pain in right foot: Secondary | ICD-10-CM

## 2018-08-24 DIAGNOSIS — I6523 Occlusion and stenosis of bilateral carotid arteries: Secondary | ICD-10-CM | POA: Diagnosis not present

## 2018-08-24 DIAGNOSIS — I10 Essential (primary) hypertension: Secondary | ICD-10-CM

## 2018-08-24 DIAGNOSIS — E559 Vitamin D deficiency, unspecified: Secondary | ICD-10-CM | POA: Diagnosis not present

## 2018-08-24 LAB — CBC
HCT: 35.7 % — ABNORMAL LOW (ref 36.0–46.0)
Hemoglobin: 11.5 g/dL — ABNORMAL LOW (ref 12.0–15.0)
MCHC: 32.2 g/dL (ref 30.0–36.0)
MCV: 80.6 fl (ref 78.0–100.0)
PLATELETS: 268 10*3/uL (ref 150.0–400.0)
RBC: 4.43 Mil/uL (ref 3.87–5.11)
RDW: 15.3 % (ref 11.5–15.5)
WBC: 4.7 10*3/uL (ref 4.0–10.5)

## 2018-08-24 LAB — COMPREHENSIVE METABOLIC PANEL
ALT: 13 U/L (ref 0–35)
AST: 19 U/L (ref 0–37)
Albumin: 4.1 g/dL (ref 3.5–5.2)
Alkaline Phosphatase: 60 U/L (ref 39–117)
BUN: 15 mg/dL (ref 6–23)
CALCIUM: 10.4 mg/dL (ref 8.4–10.5)
CHLORIDE: 107 meq/L (ref 96–112)
CO2: 28 meq/L (ref 19–32)
CREATININE: 1.34 mg/dL — AB (ref 0.40–1.20)
GFR: 48.94 mL/min — AB (ref >60.00)
Glucose, Bld: 90 mg/dL (ref 70–99)
Potassium: 5 mEq/L (ref 3.5–5.1)
Sodium: 142 mEq/L (ref 135–145)
Total Bilirubin: 0.6 mg/dL (ref 0.2–1.2)
Total Protein: 7.6 g/dL (ref 6.0–8.3)

## 2018-08-24 LAB — LIPID PANEL
CHOL/HDL RATIO: 5
Cholesterol: 269 mg/dL — ABNORMAL HIGH (ref 0–200)
HDL: 59.5 mg/dL (ref >39.00)
LDL CALC: 184 mg/dL — AB (ref 0–99)
NonHDL: 209.51
TRIGLYCERIDES: 126 mg/dL (ref 0.0–149.0)
VLDL: 25.2 mg/dL (ref 0.0–40.0)

## 2018-08-24 LAB — VITAMIN D 25 HYDROXY (VIT D DEFICIENCY, FRACTURES): VITD: 18.03 ng/mL — ABNORMAL LOW (ref 30.00–100.00)

## 2018-08-24 MED ORDER — LISINOPRIL 40 MG PO TABS: 40.0000 mg | ORAL_TABLET | Freq: Every day | ORAL | 3 refills | Status: DC

## 2018-08-24 NOTE — Patient Instructions (Signed)
Health Maintenance for Postmenopausal Women Menopause is a normal process in which your reproductive ability comes to an end. This process happens gradually over a span of months to years, usually between the ages of 22 and 9. Menopause is complete when you have missed 12 consecutive menstrual periods. It is important to talk with your health care provider about some of the most common conditions that affect postmenopausal women, such as heart disease, cancer, and bone loss (osteoporosis). Adopting a healthy lifestyle and getting preventive care can help to promote your health and wellness. Those actions can also lower your chances of developing some of these common conditions. What should I know about menopause? During menopause, you may experience a number of symptoms, such as:  Moderate-to-severe hot flashes.  Night sweats.  Decrease in sex drive.  Mood swings.  Headaches.  Tiredness.  Irritability.  Memory problems.  Insomnia.  Choosing to treat or not to treat menopausal changes is an individual decision that you make with your health care provider. What should I know about hormone replacement therapy and supplements? Hormone therapy products are effective for treating symptoms that are associated with menopause, such as hot flashes and night sweats. Hormone replacement carries certain risks, especially as you become older. If you are thinking about using estrogen or estrogen with progestin treatments, discuss the benefits and risks with your health care provider. What should I know about heart disease and stroke? Heart disease, heart attack, and stroke become more likely as you age. This may be due, in part, to the hormonal changes that your body experiences during menopause. These can affect how your body processes dietary fats, triglycerides, and cholesterol. Heart attack and stroke are both medical emergencies. There are many things that you can do to help prevent heart disease  and stroke:  Have your blood pressure checked at least every 1-2 years. High blood pressure causes heart disease and increases the risk of stroke.  If you are 53-22 years old, ask your health care provider if you should take aspirin to prevent a heart attack or a stroke.  Do not use any tobacco products, including cigarettes, chewing tobacco, or electronic cigarettes. If you need help quitting, ask your health care provider.  It is important to eat a healthy diet and maintain a healthy weight. ? Be sure to include plenty of vegetables, fruits, low-fat dairy products, and lean protein. ? Avoid eating foods that are high in solid fats, added sugars, or salt (sodium).  Get regular exercise. This is one of the most important things that you can do for your health. ? Try to exercise for at least 150 minutes each week. The type of exercise that you do should increase your heart rate and make you sweat. This is known as moderate-intensity exercise. ? Try to do strengthening exercises at least twice each week. Do these in addition to the moderate-intensity exercise.  Know your numbers.Ask your health care provider to check your cholesterol and your blood glucose. Continue to have your blood tested as directed by your health care provider.  What should I know about cancer screening? There are several types of cancer. Take the following steps to reduce your risk and to catch any cancer development as early as possible. Breast Cancer  Practice breast self-awareness. ? This means understanding how your breasts normally appear and feel. ? It also means doing regular breast self-exams. Let your health care provider know about any changes, no matter how small.  If you are 40  or older, have a clinician do a breast exam (clinical breast exam or CBE) every year. Depending on your age, family history, and medical history, it may be recommended that you also have a yearly breast X-ray (mammogram).  If you  have a family history of breast cancer, talk with your health care provider about genetic screening.  If you are at high risk for breast cancer, talk with your health care provider about having an MRI and a mammogram every year.  Breast cancer (BRCA) gene test is recommended for women who have family members with BRCA-related cancers. Results of the assessment will determine the need for genetic counseling and BRCA1 and for BRCA2 testing. BRCA-related cancers include these types: ? Breast. This occurs in males or females. ? Ovarian. ? Tubal. This may also be called fallopian tube cancer. ? Cancer of the abdominal or pelvic lining (peritoneal cancer). ? Prostate. ? Pancreatic.  Cervical, Uterine, and Ovarian Cancer Your health care provider may recommend that you be screened regularly for cancer of the pelvic organs. These include your ovaries, uterus, and vagina. This screening involves a pelvic exam, which includes checking for microscopic changes to the surface of your cervix (Pap test).  For women ages 21-65, health care providers may recommend a pelvic exam and a Pap test every three years. For women ages 79-65, they may recommend the Pap test and pelvic exam, combined with testing for human papilloma virus (HPV), every five years. Some types of HPV increase your risk of cervical cancer. Testing for HPV may also be done on women of any age who have unclear Pap test results.  Other health care providers may not recommend any screening for nonpregnant women who are considered low risk for pelvic cancer and have no symptoms. Ask your health care provider if a screening pelvic exam is right for you.  If you have had past treatment for cervical cancer or a condition that could lead to cancer, you need Pap tests and screening for cancer for at least 20 years after your treatment. If Pap tests have been discontinued for you, your risk factors (such as having a new sexual partner) need to be  reassessed to determine if you should start having screenings again. Some women have medical problems that increase the chance of getting cervical cancer. In these cases, your health care provider may recommend that you have screening and Pap tests more often.  If you have a family history of uterine cancer or ovarian cancer, talk with your health care provider about genetic screening.  If you have vaginal bleeding after reaching menopause, tell your health care provider.  There are currently no reliable tests available to screen for ovarian cancer.  Lung Cancer Lung cancer screening is recommended for adults 69-62 years old who are at high risk for lung cancer because of a history of smoking. A yearly low-dose CT scan of the lungs is recommended if you:  Currently smoke.  Have a history of at least 30 pack-years of smoking and you currently smoke or have quit within the past 15 years. A pack-year is smoking an average of one pack of cigarettes per day for one year.  Yearly screening should:  Continue until it has been 15 years since you quit.  Stop if you develop a health problem that would prevent you from having lung cancer treatment.  Colorectal Cancer  This type of cancer can be detected and can often be prevented.  Routine colorectal cancer screening usually begins at  age 42 and continues through age 45.  If you have risk factors for colon cancer, your health care provider may recommend that you be screened at an earlier age.  If you have a family history of colorectal cancer, talk with your health care provider about genetic screening.  Your health care provider may also recommend using home test kits to check for hidden blood in your stool.  A small camera at the end of a tube can be used to examine your colon directly (sigmoidoscopy or colonoscopy). This is done to check for the earliest forms of colorectal cancer.  Direct examination of the colon should be repeated every  5-10 years until age 71. However, if early forms of precancerous polyps or small growths are found or if you have a family history or genetic risk for colorectal cancer, you may need to be screened more often.  Skin Cancer  Check your skin from head to toe regularly.  Monitor any moles. Be sure to tell your health care provider: ? About any new moles or changes in moles, especially if there is a change in a mole's shape or color. ? If you have a mole that is larger than the size of a pencil eraser.  If any of your family members has a history of skin cancer, especially at a young age, talk with your health care provider about genetic screening.  Always use sunscreen. Apply sunscreen liberally and repeatedly throughout the day.  Whenever you are outside, protect yourself by wearing long sleeves, pants, a wide-brimmed hat, and sunglasses.  What should I know about osteoporosis? Osteoporosis is a condition in which bone destruction happens more quickly than new bone creation. After menopause, you may be at an increased risk for osteoporosis. To help prevent osteoporosis or the bone fractures that can happen because of osteoporosis, the following is recommended:  If you are 46-71 years old, get at least 1,000 mg of calcium and at least 600 mg of vitamin D per day.  If you are older than age 55 but younger than age 65, get at least 1,200 mg of calcium and at least 600 mg of vitamin D per day.  If you are older than age 54, get at least 1,200 mg of calcium and at least 800 mg of vitamin D per day.  Smoking and excessive alcohol intake increase the risk of osteoporosis. Eat foods that are rich in calcium and vitamin D, and do weight-bearing exercises several times each week as directed by your health care provider. What should I know about how menopause affects my mental health? Depression may occur at any age, but it is more common as you become older. Common symptoms of depression  include:  Low or sad mood.  Changes in sleep patterns.  Changes in appetite or eating patterns.  Feeling an overall lack of motivation or enjoyment of activities that you previously enjoyed.  Frequent crying spells.  Talk with your health care provider if you think that you are experiencing depression. What should I know about immunizations? It is important that you get and maintain your immunizations. These include:  Tetanus, diphtheria, and pertussis (Tdap) booster vaccine.  Influenza every year before the flu season begins.  Pneumonia vaccine.  Shingles vaccine.  Your health care provider may also recommend other immunizations. This information is not intended to replace advice given to you by your health care provider. Make sure you discuss any questions you have with your health care provider. Document Released: 01/09/2006  Document Revised: 06/06/2016 Document Reviewed: 08/21/2015 Elsevier Interactive Patient Education  2018 [employer]  

## 2018-08-24 NOTE — Assessment & Plan Note (Signed)
Uncontrolled Increase Lisinopril to 40 mg daily Reinforced DASH diet and aerobic exercise CBC and CMET today  RTC in 2 weeks for BP follow up

## 2018-08-24 NOTE — Assessment & Plan Note (Signed)
CMET and Lipid profile today Encouraged her to consume a low fat diet May need to restart Pravastatin, will decide based on labs

## 2018-08-24 NOTE — Progress Notes (Addendum)
HPI:  Pt presents to the clinic today for her Medicare Wellness Exam. She is also due to follow up chronic conditions.  HTN: Her BP today is. She is taking Lisinopril as prescribed. ECG from 12/2015.  HLD: Her last LDL was 76, 08/2017. She stopped taking her Pravastatin as prescribed. She tries to consume a low fat diet.  Carotid Atherosclerosis: Carotid ultrasound from 03/2017 reviewed. She stopped taking her Pravastatin and ASA secondary to nose bleeds.  Recent ER visit for right foot pain. Xray negative for acute findings. She was treated with Prednisone for 3 days. She reports the pain has resolved.  Past Medical History:  Diagnosis Date  . Hyperlipidemia   . Hypertension     Current Outpatient Medications  Medication Sig Dispense Refill  . lisinopril (PRINIVIL,ZESTRIL) 20 MG tablet TAKE 1 TABLET (20 MG TOTAL) BY MOUTH DAILY. 90 tablet 1  . predniSONE (DELTASONE) 10 MG tablet Take 3 tablets once a day with food for 5 days 15 tablet 0   No current facility-administered medications for this visit.     No Known Allergies  Family History  Problem Relation Age of Onset  . Heart disease Mother   . Heart disease Father   . Stroke Maternal Grandmother   . Stroke Maternal Grandfather   . Heart disease Paternal Grandmother   . Heart disease Paternal Grandfather   . Cancer Neg Hx   . Diabetes Neg Hx     Social History   Socioeconomic History  . Marital status: Married    Spouse name: Not on file  . Number of children: Not on file  . Years of education: Not on file  . Highest education level: Not on file  Occupational History  . Not on file  Social Needs  . Financial resource strain: Not on file  . Food insecurity:    Worry: Not on file    Inability: Not on file  . Transportation needs:    Medical: Not on file    Non-medical: Not on file  Tobacco Use  . Smoking status: Former Games developer  . Smokeless tobacco: Never Used  . Tobacco comment: quit 30 years ago  Substance  and Sexual Activity  . Alcohol use: Yes    Alcohol/week: 0.0 standard drinks    Comment: rare  . Drug use: No  . Sexual activity: Never  Lifestyle  . Physical activity:    Days per week: Not on file    Minutes per session: Not on file  . Stress: Not on file  Relationships  . Social connections:    Talks on phone: Not on file    Gets together: Not on file    Attends religious service: Not on file    Active member of club or organization: Not on file    Attends meetings of clubs or organizations: Not on file    Relationship status: Not on file  . Intimate partner violence:    Fear of current or ex partner: Not on file    Emotionally abused: Not on file    Physically abused: Not on file    Forced sexual activity: Not on file  Other Topics Concern  . Not on file  Social History Narrative  . Not on file    Hospitiliaztions: None  Health Maintenance:    Flu: never  Tetanus: never  Pneumovax: never  Prevnar: never  Zostavax: never  Shingrix: never  Mammogram: 03/2014  Pap Smear: > 5 years ago  Bone Density: unsure  Colon Screening: ? 2011  Eye Doctor: annually, [facility]  Dental Exam: as needed   Providers:   PCP: Nicki Reaperegina Abdalrahman Clementson, NP-C   I have personally reviewed and have noted:  1. The patient's medical and social history 2. Their use of alcohol, tobacco or illicit drugs 3. Their current medications and supplements 4. The patient's functional ability including ADL's, fall risks, home safety risks and hearing or visual impairment. 5. Diet and physical activities 6. Evidence for depression or mood disorder  Subjective:   Review of Systems:   Constitutional: Denies fever, malaise, fatigue, headache or abrupt weight changes.  HEENT: Denies eye pain, eye redness, ear pain, ringing in the ears, wax buildup, runny nose, nasal congestion, bloody nose, or sore throat. Respiratory: Denies difficulty breathing, shortness of breath, cough or sputum production.    Cardiovascular: Denies chest pain, chest tightness, palpitations or swelling in the hands or feet.  Gastrointestinal: Denies abdominal pain, bloating, constipation, diarrhea or blood in the stool.  GU: Denies urgency, frequency, pain with urination, burning sensation, blood in urine, odor or discharge. Musculoskeletal: Denies decrease in range of motion, difficulty with gait, muscle pain or joint pain and swelling.  Skin: Denies redness, rashes, lesions or ulcercations.  Neurological: Denies dizziness, difficulty with memory, difficulty with speech or problems with balance and coordination.  Psych: Denies anxiety, depression, SI/HI.  No other specific complaints in a complete review of systems (except as listed in HPI above).  Objective:  PE:  BP (!) 162/84   Pulse 76   Temp 98.3 F (36.8 C) (Oral)   Ht 4\' 10"  (1.473 m)   Wt 126 lb (57.2 kg)   SpO2 100%   BMI 26.33 kg/m   Wt Readings from Last 3 Encounters:  08/06/18 130 lb (59 kg)  11/03/17 140 lb (63.5 kg)  08/20/17 134 lb (60.8 kg)    General: Appears her stated age, well developed, well nourished in NAD. Skin: Warm, dry and intact.  HEENT: Head: normal shape and size; Eyes: sclera white, no icterus, conjunctiva pink, PERRLA and EOMs intact; Ears: Tm's gray and intact, normal light reflex; Throat/Mouth: Teeth present, mucosa pink and moist, no exudate, lesions or ulcerations noted.  Neck: Neck supple, trachea midline. No masses, lumps or thyromegaly present.  Cardiovascular: Normal rate and rhythm. S1,S2 noted.  No murmur, rubs or gallops noted. No JVD or BLE edema. Bilateral carotid bruits noted. Pulmonary/Chest: Normal effort and positive vesicular breath sounds. No respiratory distress. No wheezes, rales or ronchi noted.  Abdomen: Soft and nontender. Normal bowel sounds. No distention or masses noted. Liver, spleen and kidneys non palpable. Musculoskeletal:  Strength 5/5 BUE/BLE. No signs of joint swelling.  Neurological:  Alert and oriented. Cranial nerves II-XII grossly intact. Coordination normal.  Psychiatric: Mood and affect normal. Behavior is normal. Judgment and thought content normal.     BMET    Component Value Date/Time   NA 142 08/20/2017 0834   K 4.3 08/20/2017 0834   CL 108 08/20/2017 0834   CO2 29 08/20/2017 0834   GLUCOSE 88 08/20/2017 0834   BUN 21 08/20/2017 0834   CREATININE 1.24 (H) 08/20/2017 0834   CALCIUM 10.1 08/20/2017 0834    Lipid Panel     Component Value Date/Time   CHOL 174 08/20/2017 0834   TRIG 85.0 08/20/2017 0834   HDL 81.10 08/20/2017 0834   CHOLHDL 2 08/20/2017 0834   VLDL 17.0 08/20/2017 0834   LDLCALC 76 08/20/2017 0834    CBC  Component Value Date/Time   WBC 5.6 08/20/2017 0834   RBC 4.37 08/20/2017 0834   HGB 11.6 (L) 08/20/2017 0834   HCT 36.6 08/20/2017 0834   PLT 175.0 08/20/2017 0834   MCV 83.8 08/20/2017 0834   MCHC 31.7 08/20/2017 0834   RDW 14.7 08/20/2017 0834    Hgb A1C No results found for: HGBA1C    Assessment and Plan:   Medicare Annual Wellness Visit:  Diet: She does eat lean meat. She consumes fruits and veggies daily. She tries to avoid fried foods. She drinks mostly water, coffee. Physical activity: Sedentary Depression/mood screen: Negative Hearing: Intact to whispered voice Visual acuity: Grossly normal, performs annual eye exam  ADLs: Capable Fall risk: None Home safety: Good Cognitive evaluation: Intact to orientation, naming, recall and repetition EOL planning: No adv directives, full code/ I agree  Preventative Medicine: She declines flu, tetanus, prevnar, pneumovax, zostavax and shingrix. She declines pap smear, mammograms, bone density or colon cancer screening. Encouraged her to consume a balanced diet and exercise regimen. Advised her to see an eye doctor and dentist annually. Will check CBC, CMET, Lipid and Vit D today.   Next appointment:  2 weeks, follow up HTN   Nicki Reaper, NP

## 2018-08-24 NOTE — Assessment & Plan Note (Signed)
Discussed increase risk of stroke, especially now that she is not taking statin and ASA Repeat carotid ultrasound today CMET and Lipid profile today

## 2018-08-30 ENCOUNTER — Ambulatory Visit (INDEPENDENT_AMBULATORY_CARE_PROVIDER_SITE_OTHER): Payer: Medicare Other

## 2018-08-30 DIAGNOSIS — I6523 Occlusion and stenosis of bilateral carotid arteries: Secondary | ICD-10-CM

## 2018-09-01 MED ORDER — PRAVASTATIN SODIUM 80 MG PO TABS: 80.0000 mg | ORAL_TABLET | Freq: Every day | ORAL | 1 refills | Status: DC

## 2018-09-01 NOTE — Addendum Note (Signed)
Addended by: Roena Malady on: 09/01/2018 09:57 AM   Modules accepted: Orders

## 2018-09-07 ENCOUNTER — Encounter: Payer: Self-pay | Admitting: Internal Medicine

## 2018-09-07 ENCOUNTER — Ambulatory Visit (INDEPENDENT_AMBULATORY_CARE_PROVIDER_SITE_OTHER): Payer: Medicare Other | Admitting: Internal Medicine

## 2018-09-07 DIAGNOSIS — I6523 Occlusion and stenosis of bilateral carotid arteries: Secondary | ICD-10-CM | POA: Diagnosis not present

## 2018-09-07 DIAGNOSIS — I10 Essential (primary) hypertension: Secondary | ICD-10-CM

## 2018-09-07 NOTE — Patient Instructions (Signed)

## 2018-09-07 NOTE — Progress Notes (Signed)
Subjective:    Patient ID: Haley Randall, female    DOB: 1936/03/10, 82 y.o.   MRN: [id number]  HPI  Pt presents to the clinic today for 2 week follow up of HTN. At her last visit, her BP was elevated at 162/84. Her Lisinopril was increased to 40 mg daily. She has been taking the medication as prescribed. She denies adverse side effects. Her BP today is 136/72. ECG from 12/2015 reviewed.  Review of Systems      Past Medical History:  Diagnosis Date  . Hyperlipidemia   . Hypertension     Current Outpatient Medications  Medication Sig Dispense Refill  . lisinopril (PRINIVIL,ZESTRIL) 40 MG tablet Take 1 tablet (40 mg total) by mouth daily. 90 tablet 3  . pravastatin (PRAVACHOL) 80 MG tablet Take 1 tablet (80 mg total) by mouth daily. 90 tablet 1   No current facility-administered medications for this visit.     No Known Allergies  Family History  Problem Relation Age of Onset  . Heart disease Mother   . Heart disease Father   . Stroke Maternal Grandmother   . Stroke Maternal Grandfather   . Heart disease Paternal Grandmother   . Heart disease Paternal Grandfather   . Cancer Neg Hx   . Diabetes Neg Hx     Social History   Socioeconomic History  . Marital status: Married    Spouse name: Not on file  . Number of children: Not on file  . Years of education: Not on file  . Highest education level: Not on file  Occupational History  . Not on file  Social Needs  . Financial resource strain: Not on file  . Food insecurity:    Worry: Not on file    Inability: Not on file  . Transportation needs:    Medical: Not on file    Non-medical: Not on file  Tobacco Use  . Smoking status: Former Games developer  . Smokeless tobacco: Never Used  . Tobacco comment: quit 30 years ago  Substance and Sexual Activity  . Alcohol use: Yes    Alcohol/week: 0.0 standard drinks    Comment: rare  . Drug use: No  . Sexual activity: Never  Lifestyle  . Physical activity:    Days per week: Not  on file    Minutes per session: Not on file  . Stress: Not on file  Relationships  . Social connections:    Talks on phone: Not on file    Gets together: Not on file    Attends religious service: Not on file    Active member of club or organization: Not on file    Attends meetings of clubs or organizations: Not on file    Relationship status: Not on file  . Intimate partner violence:    Fear of current or ex partner: Not on file    Emotionally abused: Not on file    Physically abused: Not on file    Forced sexual activity: Not on file  Other Topics Concern  . Not on file  Social History Narrative  . Not on file     Constitutional: Denies fever, malaise, fatigue, headache or abrupt weight changes.  HEENT: Denies eye pain, eye redness, ear pain, ringing in the ears, wax buildup, runny nose, nasal congestion, bloody nose, or sore throat. Respiratory: Denies difficulty breathing, shortness of breath, cough or sputum production.   Cardiovascular: Denies chest pain, chest tightness, palpitations or swelling in the hands or feet.  Neurological: Denies dizziness, difficulty with memory, difficulty with speech or problems with balance and coordination.    No other specific complaints in a complete review of systems (except as listed in HPI above).  Objective:   Physical Exam  BP 136/72   Pulse 88   Temp 98 F (36.7 C) (Oral)   Wt 122 lb (55.3 kg)   SpO2 98%   BMI 25.50 kg/m  Wt Readings from Last 3 Encounters:  09/07/18 122 lb (55.3 kg)  08/24/18 126 lb (57.2 kg)  08/06/18 130 lb (59 kg)    General: Appears her stated age, well developed, well nourished in NAD. Cardiovascular: Normal rate and rhythm. S1,S2 noted.  No murmur, rubs or gallops noted. No JVD or BLE edema.  Pulmonary/Chest: Normal effort and positive vesicular breath sounds. No respiratory distress. No wheezes, rales or ronchi noted.   Neurological: Alert and oriented.   BMET    Component Value Date/Time    NA 142 08/24/2018 0831   K 5.0 08/24/2018 0831   CL 107 08/24/2018 0831   CO2 28 08/24/2018 0831   GLUCOSE 90 08/24/2018 0831   BUN 15 08/24/2018 0831   CREATININE 1.34 (H) 08/24/2018 0831   CALCIUM 10.4 08/24/2018 0831    Lipid Panel     Component Value Date/Time   CHOL 269 (H) 08/24/2018 0831   TRIG 126.0 08/24/2018 0831   HDL 59.50 08/24/2018 0831   CHOLHDL 5 08/24/2018 0831   VLDL 25.2 08/24/2018 0831   LDLCALC 184 (H) 08/24/2018 0831    CBC    Component Value Date/Time   WBC 4.7 08/24/2018 0831   RBC 4.43 08/24/2018 0831   HGB 11.5 (L) 08/24/2018 0831   HCT 35.7 (L) 08/24/2018 0831   PLT 268.0 08/24/2018 0831   MCV 80.6 08/24/2018 0831   MCHC 32.2 08/24/2018 0831   RDW 15.3 08/24/2018 0831    Hgb A1C No results found for: HGBA1C          Assessment & Plan:

## 2018-09-07 NOTE — Assessment & Plan Note (Signed)
Controlled with increase Lisinopril 40 mg daily Will continue for now  Lab only appt in 2 months for repeat lipid panel/CMET

## 2018-10-07 ENCOUNTER — Ambulatory Visit: Payer: Medicare Other | Admitting: Internal Medicine

## 2018-10-22 ENCOUNTER — Other Ambulatory Visit: Payer: Self-pay | Admitting: Internal Medicine

## 2018-12-07 ENCOUNTER — Other Ambulatory Visit (INDEPENDENT_AMBULATORY_CARE_PROVIDER_SITE_OTHER): Payer: Medicare Other

## 2018-12-07 DIAGNOSIS — E7800 Pure hypercholesterolemia, unspecified: Secondary | ICD-10-CM | POA: Diagnosis not present

## 2018-12-07 LAB — LIPID PANEL
CHOL/HDL RATIO: 3
Cholesterol: 209 mg/dL — ABNORMAL HIGH (ref 0–200)
HDL: 81 mg/dL (ref >39.00)
LDL CALC: 107 mg/dL — AB (ref 0–99)
NONHDL: 127.7
Triglycerides: 104 mg/dL (ref 0.0–149.0)
VLDL: 20.8 mg/dL (ref 0.0–40.0)

## 2018-12-07 LAB — COMPREHENSIVE METABOLIC PANEL
ALT: 17 U/L (ref 0–35)
AST: 23 U/L (ref 0–37)
Albumin: 4.2 g/dL (ref 3.5–5.2)
Alkaline Phosphatase: 60 U/L (ref 39–117)
BUN: 17 mg/dL (ref 6–23)
CHLORIDE: 105 meq/L (ref 96–112)
CO2: 29 meq/L (ref 19–32)
CREATININE: 1.17 mg/dL (ref 0.40–1.20)
Calcium: 10.3 mg/dL (ref 8.4–10.5)
GFR: 57.19 mL/min — ABNORMAL LOW (ref >60.00)
GLUCOSE: 95 mg/dL (ref 70–99)
Potassium: 4.1 mEq/L (ref 3.5–5.1)
SODIUM: 141 meq/L (ref 135–145)
Total Bilirubin: 0.8 mg/dL (ref 0.2–1.2)
Total Protein: 7.4 g/dL (ref 6.0–8.3)

## 2018-12-08 ENCOUNTER — Encounter: Payer: Self-pay | Admitting: *Deleted

## 2019-02-08 IMAGING — DX DG FOOT COMPLETE 3+V*R*
3 series · 3 of 3 positions shown · non-contrast
Comparison: None.

CLINICAL DATA: Pain and swelling in foot since [REDACTED]. Radiating
pain to ankle. Difficult to walk. No known injury.

EXAM:
RIGHT FOOT COMPLETE - 3+ VIEW

[foot ap]
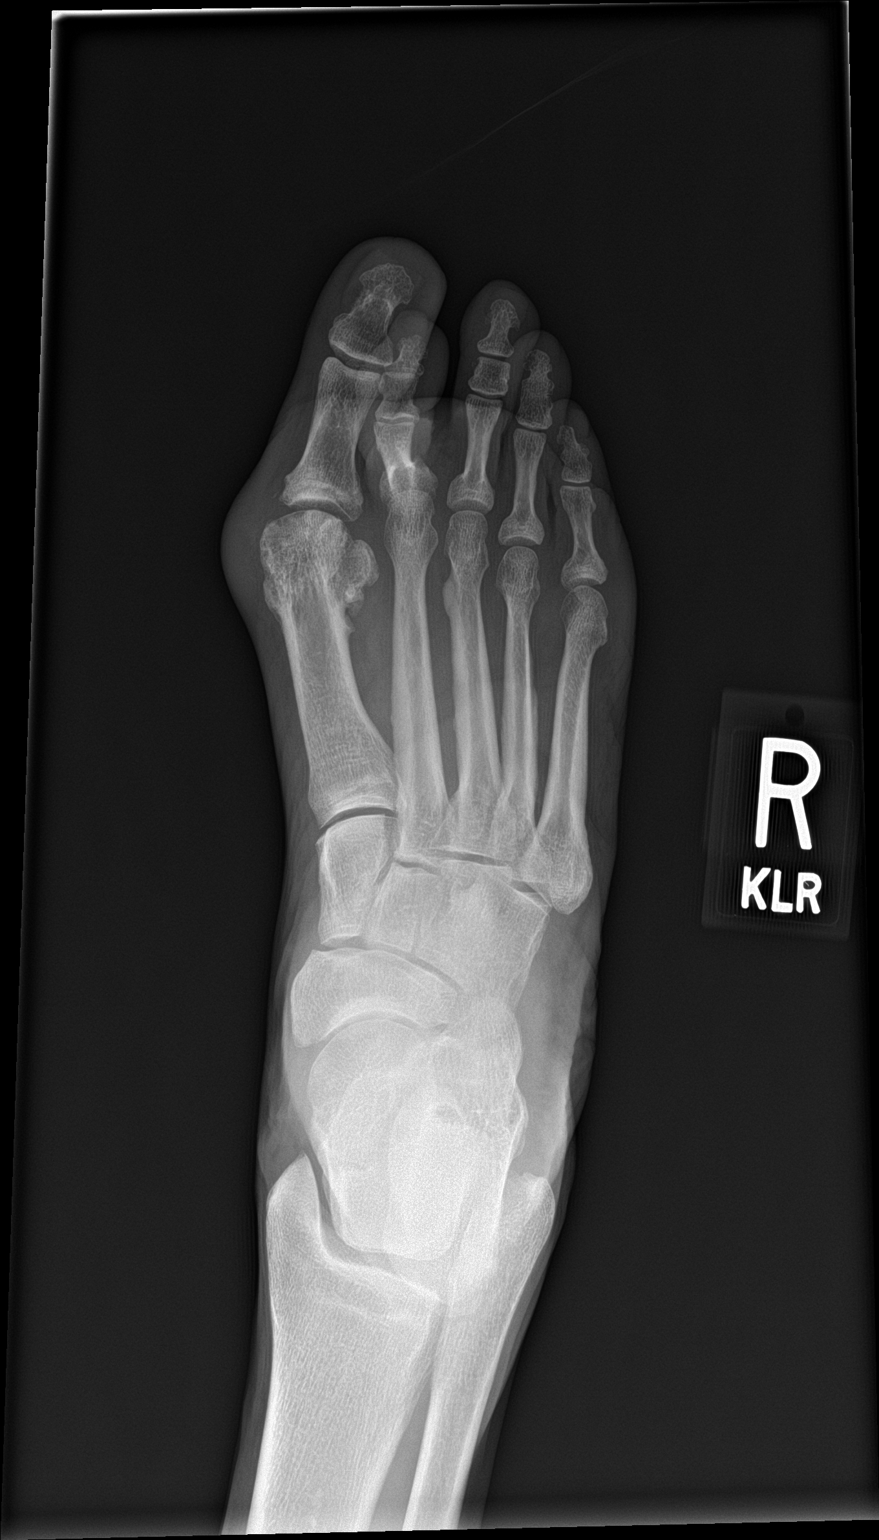

[foot obl]
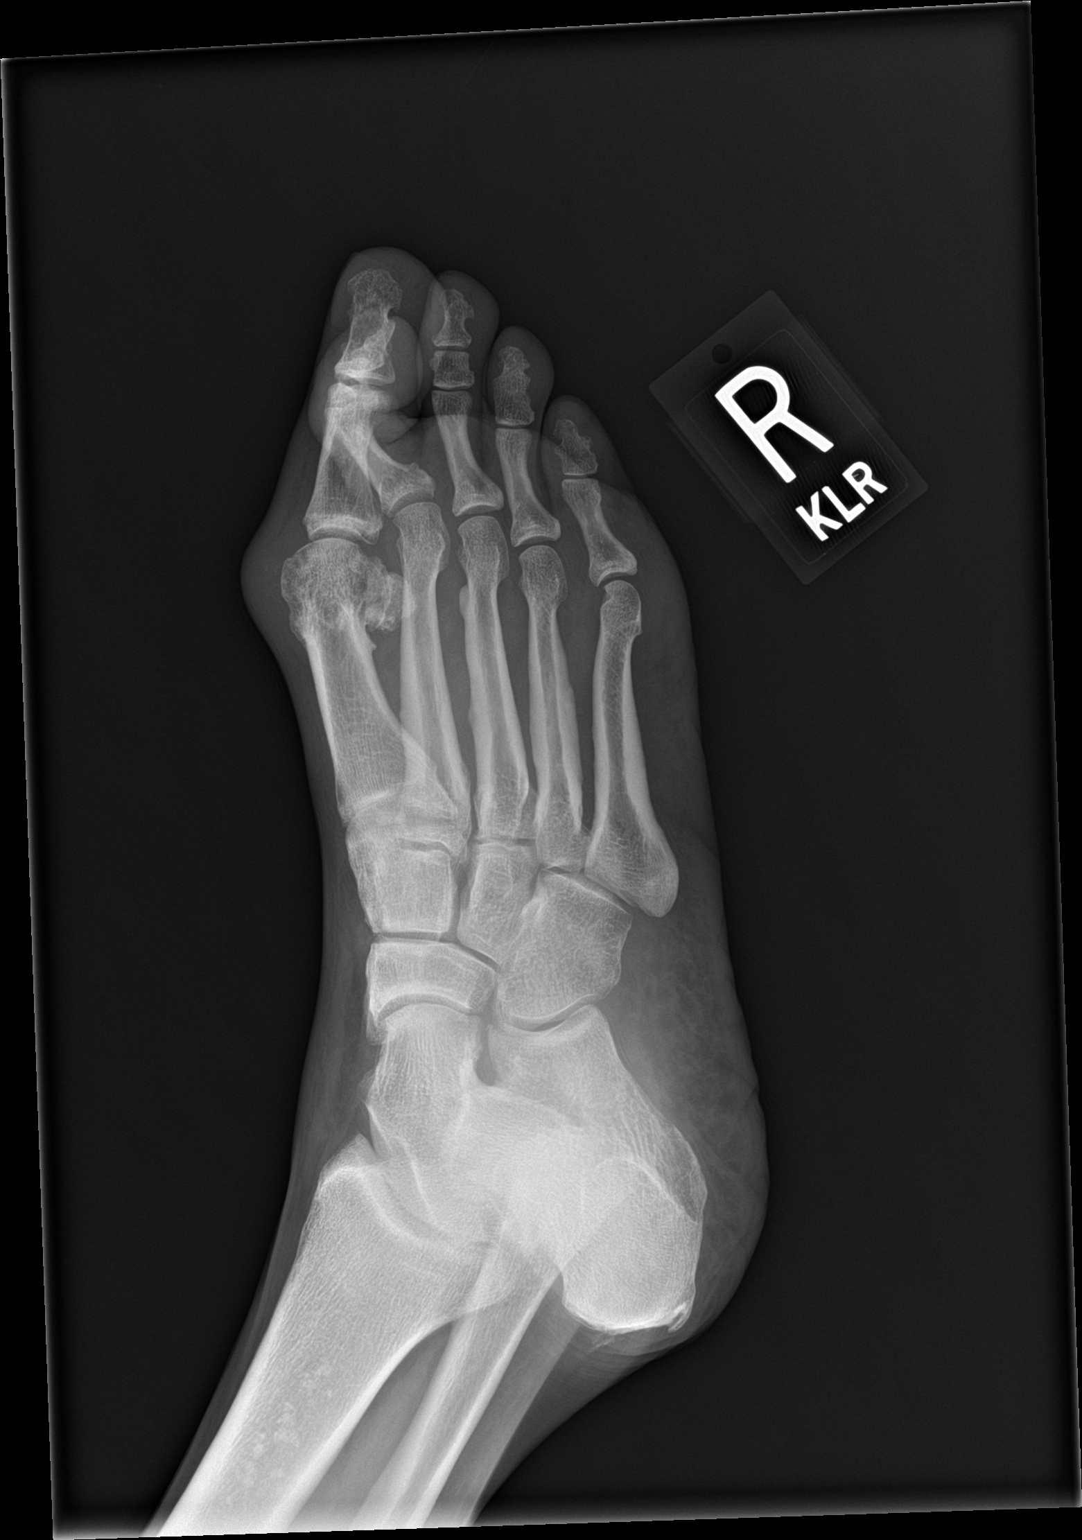

[foot lat]
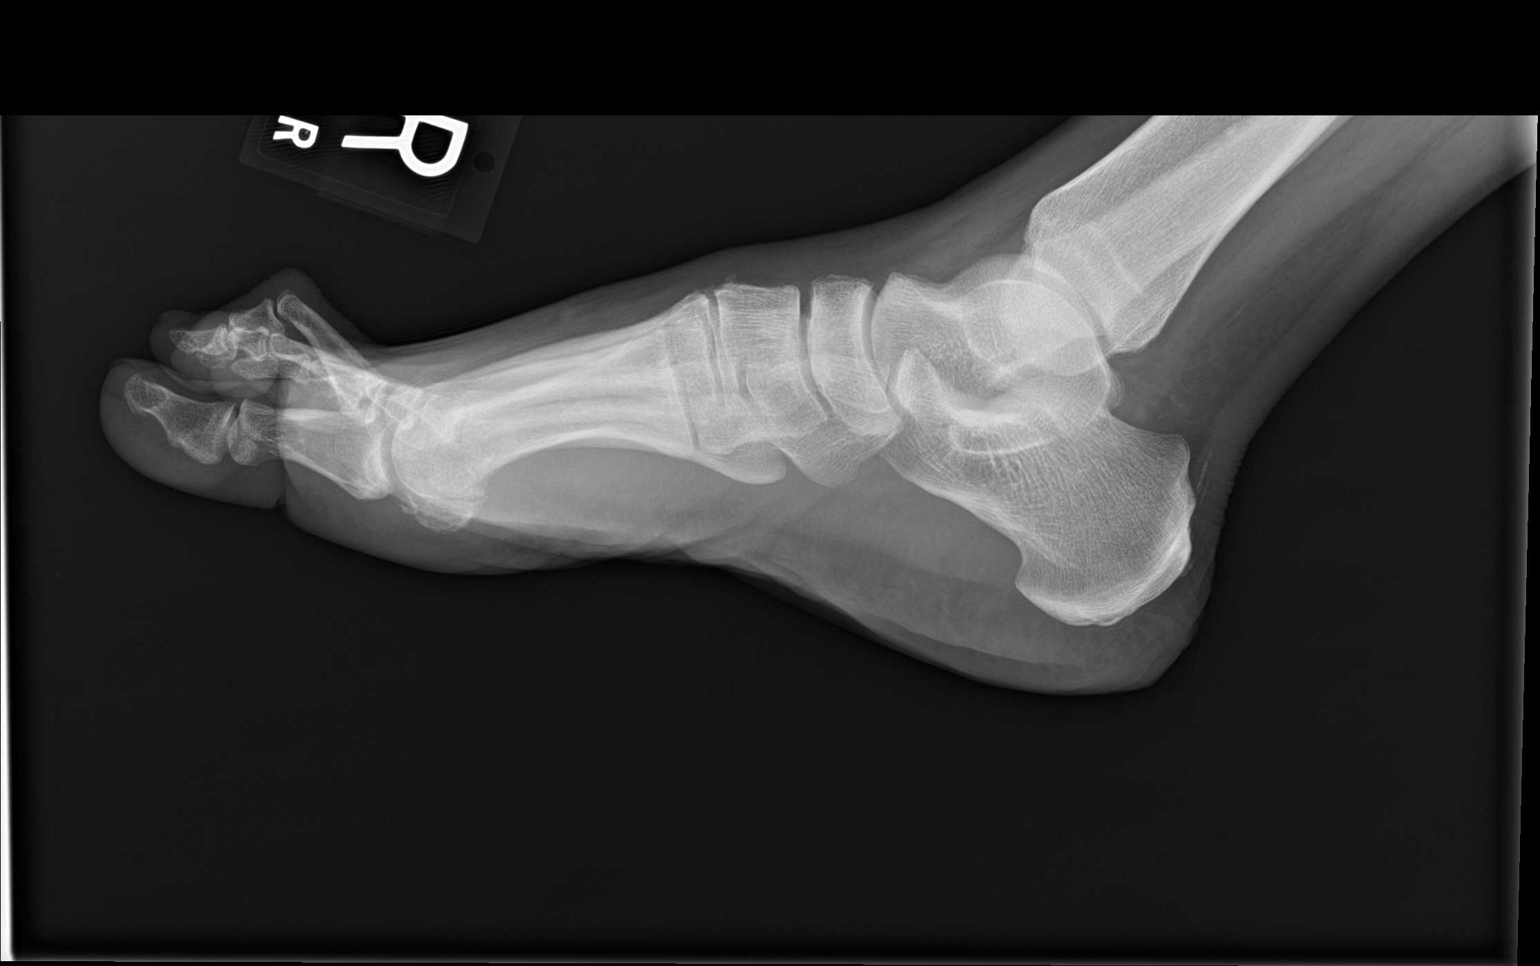

[3 of 3 positions shown; findings below may reference images not displayed]

FINDINGS: Chroni[REDACTED]ux valgus deformity at the first MTP joint, with
overlying soft tissue thickening suggesting bunion and/or bursitis.
Associated degenerative spurring at the joint space.

No acute appearing osseous abnormality. No erosions or other signs
of an inflammatory arthritis. Soft tissues about the RIGHT foot are
otherwise unremarkable.
IMPRESSION: 1. No acute findings.
2. Chroni[REDACTED]ux valgus deformity at the first MTP joint, with
associated degenerative spurring, and with overlying soft tissue
prominence suggesting bunion and/or bursitis.

## 2019-04-30 ENCOUNTER — Other Ambulatory Visit: Payer: Self-pay | Admitting: Internal Medicine

## 2019-05-02 NOTE — Telephone Encounter (Signed)
[facility] Night - Client Nonclinical Telephone Record [facility] Client [facility] Night - Client Client Site [facility] - Night Physician Nicki Reaper - NP Contact Type Call Who Is Calling Patient / Member / Family / Caregiver Caller Name nijaya baumeister Caller Phone Number [phone] Patient Name elika kampf Patient DOB [birth year] Call Type Message Only Information Provided Reason for Call Request for General Office Information Initial Comment Caller states she needs a refill on Pravastatin Sodium 8 mg Additional Comment Call Closed By: Berneta Levins Transaction Date/Time: 04/30/2019 9:41:44 AM (ET)

## 2019-05-02 NOTE — Telephone Encounter (Signed)
Patient called on call over the weekend requesting refill on her medication, stating she is completely out.  Will forward to CMA as high priority since patient is out of medication.   Appears patient also walked in today requesting refill.

## 2019-05-02 NOTE — Telephone Encounter (Signed)
Pt came by office to let us know she is completely out of this medication.

## 2019-05-03 NOTE — Telephone Encounter (Signed)
Rx sent through e-scribe  

## 2019-07-27 ENCOUNTER — Other Ambulatory Visit: Payer: Self-pay | Admitting: Internal Medicine

## 2019-08-30 ENCOUNTER — Other Ambulatory Visit: Payer: Self-pay

## 2019-08-30 ENCOUNTER — Encounter: Payer: Medicare Other | Admitting: Internal Medicine

## 2019-08-30 ENCOUNTER — Encounter: Payer: Self-pay | Admitting: Internal Medicine

## 2019-08-30 ENCOUNTER — Ambulatory Visit (INDEPENDENT_AMBULATORY_CARE_PROVIDER_SITE_OTHER): Payer: Medicare Other | Admitting: Internal Medicine

## 2019-08-30 VITALS — BP 130/84 | HR 78 | Temp 97.9°F | Ht <= 58 in | Wt 129.0 lb

## 2019-08-30 DIAGNOSIS — Z780 Asymptomatic menopausal state: Secondary | ICD-10-CM | POA: Diagnosis not present

## 2019-08-30 DIAGNOSIS — E7800 Pure hypercholesterolemia, unspecified: Secondary | ICD-10-CM

## 2019-08-30 DIAGNOSIS — I10 Essential (primary) hypertension: Secondary | ICD-10-CM | POA: Diagnosis not present

## 2019-08-30 DIAGNOSIS — Z0000 Encounter for general adult medical examination without abnormal findings: Secondary | ICD-10-CM

## 2019-08-30 DIAGNOSIS — I6523 Occlusion and stenosis of bilateral carotid arteries: Secondary | ICD-10-CM | POA: Diagnosis not present

## 2019-08-30 DIAGNOSIS — Z23 Encounter for immunization: Secondary | ICD-10-CM | POA: Diagnosis not present

## 2019-08-30 LAB — LIPID PANEL
Cholesterol: 189 mg/dL (ref 0–200)
HDL: 64.9 mg/dL (ref >39.00)
LDL Cholesterol: 88 mg/dL (ref 0–99)
NonHDL: 123.77
Total CHOL/HDL Ratio: 3
Triglycerides: 179 mg/dL — ABNORMAL HIGH (ref 0.0–149.0)
VLDL: 35.8 mg/dL (ref 0.0–40.0)

## 2019-08-30 LAB — COMPREHENSIVE METABOLIC PANEL
ALT: 17 U/L (ref 0–35)
AST: 25 U/L (ref 0–37)
Albumin: 4.4 g/dL (ref 3.5–5.2)
Alkaline Phosphatase: 67 U/L (ref 39–117)
BUN: 15 mg/dL (ref 6–23)
CO2: 29 mEq/L (ref 19–32)
Calcium: 10.9 mg/dL — ABNORMAL HIGH (ref 8.4–10.5)
Chloride: 105 mEq/L (ref 96–112)
Creatinine, Ser: 1.28 mg/dL — ABNORMAL HIGH (ref 0.40–1.20)
GFR: 48.42 mL/min — ABNORMAL LOW (ref >60.00)
Glucose, Bld: 85 mg/dL (ref 70–99)
Potassium: 4.6 mEq/L (ref 3.5–5.1)
Sodium: 141 mEq/L (ref 135–145)
Total Bilirubin: 0.7 mg/dL (ref 0.2–1.2)
Total Protein: 7.7 g/dL (ref 6.0–8.3)

## 2019-08-30 LAB — CBC
HCT: 35.7 % — ABNORMAL LOW (ref 36.0–46.0)
Hemoglobin: 11.3 g/dL — ABNORMAL LOW (ref 12.0–15.0)
MCHC: 31.6 g/dL (ref 30.0–36.0)
MCV: 84.1 fl (ref 78.0–100.0)
Platelets: 186 10*3/uL (ref 150.0–400.0)
RBC: 4.25 Mil/uL (ref 3.87–5.11)
RDW: 14.7 % (ref 11.5–15.5)
WBC: 6 10*3/uL (ref 4.0–10.5)

## 2019-08-30 MED ORDER — ASPIRIN EC 81 MG PO TBEC: 81.0000 mg | DELAYED_RELEASE_TABLET | Freq: Every day | ORAL | 2 refills | Status: DC

## 2019-08-30 NOTE — Progress Notes (Signed)
HPI:  Pt presents to the clinic today for her subsequent annual Medicare Wellness Exam. She is also due to follow up chronic conditions.  HTN: her BP today is 130/84. She is taking Lisinopril as prescribed. ECG from 12/2015 reviewed.  HLD: Her last LDL was 107, 12/2018. She denies myalgias on Pravastatin. She tries to consume a low fat diet.  Past Medical History:  Diagnosis Date  . Hyperlipidemia   . Hypertension     Current Outpatient Medications  Medication Sig Dispense Refill  . lisinopril (PRINIVIL,ZESTRIL) 40 MG tablet Take 1 tablet (40 mg total) by mouth daily. 90 tablet 3  . pravastatin (PRAVACHOL) 80 MG tablet TAKE 1 TABLET BY MOUTH EVERY DAY 90 tablet 0   No current facility-administered medications for this visit.     No Known Allergies  Family History  Problem Relation Age of Onset  . Heart disease Mother   . Heart disease Father   . Stroke Maternal Grandmother   . Stroke Maternal Grandfather   . Heart disease Paternal Grandmother   . Heart disease Paternal Grandfather   . Cancer Neg Hx   . Diabetes Neg Hx     Social History   Socioeconomic History  . Marital status: Married    Spouse name: Not on file  . Number of children: Not on file  . Years of education: Not on file  . Highest education level: Not on file  Occupational History  . Not on file  Social Needs  . Financial resource strain: Not on file  . Food insecurity    Worry: Not on file    Inability: Not on file  . Transportation needs    Medical: Not on file    Non-medical: Not on file  Tobacco Use  . Smoking status: Former Games developermoker  . Smokeless tobacco: Never Used  . Tobacco comment: quit 30 years ago  Substance and Sexual Activity  . Alcohol use: Yes    Alcohol/week: 0.0 standard drinks    Comment: rare  . Drug use: No  . Sexual activity: Never  Lifestyle  . Physical activity    Days per week: Not on file    Minutes per session: Not on file  . Stress: Not on file  Relationships  .  Social Musicianconnections    Talks on phone: Not on file    Gets together: Not on file    Attends religious service: Not on file    Active member of club or organization: Not on file    Attends meetings of clubs or organizations: Not on file    Relationship status: Not on file  . Intimate partner violence    Fear of current or ex partner: Not on file    Emotionally abused: Not on file    Physically abused: Not on file    Forced sexual activity: Not on file  Other Topics Concern  . Not on file  Social History Narrative  . Not on file    Hospitiliaztions: None  Health Maintenance:    Flu: never  Tetanus: > 10 years ago  Pneumovax: never  Prevnar: never  Zostavax: never  Shingrix: never  Mammogram: > 5 years ago  Pap Smear: > 5 years ago  Bone Density: > 5 years ago  Colon Screening: ? 2011  Eye Doctor: annually  Dental Exam: biannually   Providers:   PCP: Nicki Reaperegina Farren Landa, NP-C   I have personally reviewed and have noted:  1. The patient's medical and social history  2. Their use of alcohol, tobacco or illicit drugs 3. Their current medications and supplements 4. The patient's functional ability including ADL's, fall risks, home safety risks and  hearing or visual impairment. 5. Diet and physical activities 6. Evidence for depression or mood disorder  Subjective:   Review of Systems:   Constitutional: Denies fever, malaise, fatigue, headache or abrupt weight changes.  HEENT: Denies eye pain, eye redness, ear pain, ringing in the ears, wax buildup, runny nose, nasal congestion, bloody nose, or sore throat. Respiratory: Denies difficulty breathing, shortness of breath, cough or sputum production.   Cardiovascular: Denies chest pain, chest tightness, palpitations or swelling in the hands or feet.  Gastrointestinal: Denies abdominal pain, bloating, constipation, diarrhea or blood in the stool.  GU: Denies urgency, frequency, pain with urination, burning sensation, blood in  urine, odor or discharge. Musculoskeletal: Denies decrease in range of motion, difficulty with gait, muscle pain or joint pain and swelling.  Skin: Denies redness, rashes, lesions or ulcercations.  Neurological: Denies dizziness, difficulty with memory, difficulty with speech or problems with balance and coordination.  Psych: Denies anxiety, depression, SI/HI.  No other specific complaints in a complete review of systems (except as listed in HPI above).  Objective:  PE:   BP 130/84   Pulse 78   Temp 97.9 F (36.6 C) (Temporal)   Ht 4\' 10"  (1.473 m)   Wt 129 lb (58.5 kg)   SpO2 97%   BMI 26.96 kg/m   Wt Readings from Last 3 Encounters:  08/30/19 129 lb (58.5 kg)  09/07/18 122 lb (55.3 kg)  08/24/18 126 lb (57.2 kg)    General: Appears her stated age, well developed, well nourished in NAD. Skin: Warm, dry and intact. No rashesnoted. HEENT: Head: normal shape and size; Eyes: sclera white, no icterus, conjunctiva pink, PERRLA and EOMs intact; Ears: Tm's gray and intact, normal light reflex;  Neck: Neck supple, trachea midline. No masses, lumps or thyromegaly present.  Cardiovascular: Normal rate and rhythm. S1,S2 noted.  No murmur, rubs or gallops noted. No JVD or BLE edema. Bilateral carotid bruits noted. Pulmonary/Chest: Normal effort and positive vesicular breath sounds. No respiratory distress. No wheezes, rales or ronchi noted.  Abdomen: Soft and nontender. Normal bowel sounds. No distention or masses noted. Liver, spleen and kidneys non palpable. Musculoskeletal: Strength 5/5 BUE/BLE. No signs of joint swelling.  Neurological: Alert and oriented. Cranial nerves II-XII grossly intact. Coordination normal.  Psychiatric: Mood and affect normal. Behavior is normal. Judgment and thought content normal.     BMET    Component Value Date/Time   NA 141 12/07/2018 0812   K 4.1 12/07/2018 0812   CL 105 12/07/2018 0812   CO2 29 12/07/2018 0812   GLUCOSE 95 12/07/2018 0812   BUN  17 12/07/2018 0812   CREATININE 1.17 12/07/2018 0812   CALCIUM 10.3 12/07/2018 0812    Lipid Panel     Component Value Date/Time   CHOL 209 (H) 12/07/2018 0812   TRIG 104.0 12/07/2018 0812   HDL 81.00 12/07/2018 0812   CHOLHDL 3 12/07/2018 0812   VLDL 20.8 12/07/2018 0812   LDLCALC 107 (H) 12/07/2018 0812    CBC    Component Value Date/Time   WBC 4.7 08/24/2018 0831   RBC 4.43 08/24/2018 0831   HGB 11.5 (L) 08/24/2018 0831   HCT 35.7 (L) 08/24/2018 0831   PLT 268.0 08/24/2018 0831   MCV 80.6 08/24/2018 0831   MCHC 32.2 08/24/2018 0831   RDW 15.3 08/24/2018  0831    Hgb A1C No results found for: HGBA1C    Assessment and Plan:   Medicare Annual Wellness Visit:  Diet: She does eat lean meat. She consumes fruits and some veggies. She tries to avoid fried foods. She drinks mostly water. Physical activity: Sedentery. Depression/mood screen: Negative, PHQ 9 score of ) Hearing: Intact to whispered voice Visual acuity: Grossly normal, performs annual eye exam  ADLs: Capable Fall risk: None Home safety: Good Cognitive evaluation: Intact to orientation, naming, recall and repetition EOL planning: Adv directives, full code/ I agree  Preventative Medicine: Flu shot today. She declines tetanus, pneumovax, prevnar, zostovax or shingrix. She no longer needs pap smear, mammogram or colon cancer screening. She declines bone density exam. Encouraged her to consume a balanced diet and exercise regimen. Advised her to see an eye doctor and dentist annually. Will check CBC, CMET, Lipid and Vit D today. Due dates for screening exam given to patient as part of her AVS.   Next appointment: 1 year, Medicare Wellness Exam   Webb Silversmith, NP

## 2019-08-30 NOTE — Assessment & Plan Note (Signed)
CMET and Lipid profile today Encouraged her to consume a low fat diet. Continue Pravastatin for now 

## 2019-08-30 NOTE — Assessment & Plan Note (Signed)
Controlled on Lisinopril CMET today Reinforced DASH diet 

## 2019-08-30 NOTE — Patient Instructions (Signed)

## 2019-08-30 NOTE — Assessment & Plan Note (Signed)
Continue Pravastatin Start ASA 81 mg daily

## 2019-09-19 ENCOUNTER — Other Ambulatory Visit: Payer: Self-pay

## 2019-09-19 ENCOUNTER — Emergency Department
Admission: EM | Admit: 2019-09-19 | Discharge: 2019-09-19 | Disposition: A | Discharge status: Home / Self Care | Payer: Medicare Other | Attending: Emergency Medicine | Admitting: Emergency Medicine

## 2019-09-19 DIAGNOSIS — Z79899 Other long term (current) drug therapy: Secondary | ICD-10-CM | POA: Insufficient documentation

## 2019-09-19 DIAGNOSIS — M79672 Pain in left foot: Secondary | ICD-10-CM | POA: Insufficient documentation

## 2019-09-19 DIAGNOSIS — M2012 Hallux valgus (acquired), left foot: Secondary | ICD-10-CM | POA: Diagnosis not present

## 2019-09-19 DIAGNOSIS — M2011 Hallux valgus (acquired), right foot: Secondary | ICD-10-CM | POA: Insufficient documentation

## 2019-09-19 DIAGNOSIS — Z87891 Personal history of nicotine dependence: Secondary | ICD-10-CM | POA: Insufficient documentation

## 2019-09-19 DIAGNOSIS — M79671 Pain in right foot: Secondary | ICD-10-CM | POA: Diagnosis not present

## 2019-09-19 DIAGNOSIS — I10 Essential (primary) hypertension: Secondary | ICD-10-CM | POA: Diagnosis not present

## 2019-09-19 DIAGNOSIS — Z7982 Long term (current) use of aspirin: Secondary | ICD-10-CM | POA: Diagnosis not present

## 2019-09-19 MED ORDER — MELOXICAM 15 MG PO TABS: 15.0000 mg | ORAL_TABLET | Freq: Every day | ORAL | 0 refills | Status: AC

## 2019-09-19 NOTE — Discharge Instructions (Signed)
Your exam and previous XRs show arthritis. You may be experiencing pain, swelling, and tingling due to joint pain and mild nerve irritation. Take the prescription meds as directed. Follow-up with Podiatry for further evaluation and management. Return to the ED or see Ms. Baity for other treatment.

## 2019-09-19 NOTE — ED Triage Notes (Addendum)
Reports bilateral foot pain with weight bearing and at night X 1 week. Seen for same approx 1 month ago per patient and dx with arthritis. Pt denies injury or pain of feel with rest. Pt alert and oriented X4, cooperative, RR even and unlabored, color WNL. Pt in NAD. Pt concerned that foot pain has to do with her dietary intake of sweets and salt.

## 2019-09-19 NOTE — ED Provider Notes (Signed)
[facility] Emergency Department Provider Note ____________________________________________  Time seen: 1055  I have reviewed the triage vital signs and the nursing notes.  HISTORY  Chief Complaint  Foot Pain  HPI Haley Randall is a 83 y.o. female presents herself to the ED for evaluation of chronic ongoing bilateral foot pain.   Patient describes pain is been worse with weightbearing at night over the last week or so.  She reports being diagnosed with arthritis about a month ago after a visit here for similar symptoms.  Patient has not had any interim follow-up with primary provider or specialist in that time.  Patient was concerned that her pain may have been due to her dietary intake of sweets and salts.  She has since changed her intake over the last several weeks but reports symptoms are still persistent.  She denies any trauma, peripheral edema, skin temperature or color changes.  He does admit to times feeling like her feet are numb, as she describes the bottom of her feet feel "like cardboard."  She has been taking over-the-counter Tylenol intermittently with benefit.  Past Medical History:  Diagnosis Date  . Hyperlipidemia   . Hypertension     Patient Active Problem List   Diagnosis Date Noted  . Carotid atherosclerosis, bilateral 08/24/2018  . Essential hypertension 11/08/2015  . HLD (hyperlipidemia) 11/08/2015    Past Surgical History:  Procedure Laterality Date  . BREAST SURGERY Left 1980   biopsy    Prior to Admission medications   Medication Sig Start Date End Date Taking? Authorizing Provider  aspirin EC 81 MG tablet Take 1 tablet (81 mg total) by mouth daily. 08/30/19   Lorre Munroe, NP  lisinopril (PRINIVIL,ZESTRIL) 40 MG tablet Take 1 tablet (40 mg total) by mouth daily. 08/24/18   Lorre Munroe, NP  meloxicam (MOBIC) 15 MG tablet Take 1 tablet (15 mg total) by mouth daily. 09/19/19 10/19/19  Naif Alabi, Charlesetta Ivory, PA-C  pravastatin  (PRAVACHOL) 80 MG tablet TAKE 1 TABLET BY MOUTH EVERY DAY 07/28/19   Lorre Munroe, NP    Allergies Patient has no known allergies.  Family History  Problem Relation Age of Onset  . Heart disease Mother   . Heart disease Father   . Stroke Maternal Grandmother   . Stroke Maternal Grandfather   . Heart disease Paternal Grandmother   . Heart disease Paternal Grandfather   . Cancer Neg Hx   . Diabetes Neg Hx     Social History Social History   Tobacco Use  . Smoking status: Former Games developer  . Smokeless tobacco: Never Used  . Tobacco comment: quit 30 years ago  Substance Use Topics  . Alcohol use: Yes    Alcohol/week: 0.0 standard drinks    Comment: rare  . Drug use: No    Review of Systems  Constitutional: Negative for fever. Cardiovascular: Negative for chest pain. Respiratory: Negative for shortness of breath. Musculoskeletal: Negative for back pain.  Bilateral foot pain as above. Skin: Negative for rash. Neurological: Negative for headaches, focal weakness or numbness. ____________________________________________  PHYSICAL EXAM:  VITAL SIGNS: ED Triage Vitals  Enc Vitals Group     BP 09/19/19 1012 (!) 153/47     Pulse Rate 09/19/19 1012 70     Resp 09/19/19 1012 18     Temp 09/19/19 1012 98 F (36.7 C)     Temp Source 09/19/19 1012 Oral     SpO2 09/19/19 1012 98 %  Weight 09/19/19 1013 127 lb 13.9 oz (58 kg)     Height 09/19/19 1013 4\' 10"  (1.473 m)     Head Circumference --      Peak Flow --      Pain Score 09/19/19 1013 7     Pain Loc --      Pain Edu? --      Excl. in [place]? --     Constitutional: Alert and oriented. Well appearing and in no distress. Head: Normocephalic and atraumatic. Eyes: Conjunctivae are normal. Normal extraocular movements Cardiovascular: Normal rate, regular rhythm. Normal distal pulses and cap refill Respiratory: Normal respiratory effort.  Musculoskeletal: Nontender with normal range of motion in all extremities.  Patient  with bilateral hallux valgus deformity to both primary MTPs.  She also has hammertoe deformities to the second toes bilaterally.  No effusion, swelling, or ecchymosis noted to feet bilaterally.  Ankle exam is otherwise benign and with normal range.  No calf or Achilles tenderness is elicited. Neurologic:  Normal sensation. Normal speech and language. No gross focal neurologic deficits are appreciated. Skin:  Skin is warm, dry and intact. No rash noted. Psychiatric: Mood and affect are normal. Patient exhibits appropriate insight and judgment. ____________________________________________  PROCEDURES  Procedures ____________________________________________  INITIAL IMPRESSION / ASSESSMENT AND PLAN / ED COURSE  Geriatric patient with ED evaluation of chronic bilateral foot pain.  She denies any interim injury and exam is overall benign and reassuring at this time.  Review of the chart reveals previous diagnoses of degenerative changes to the first and second toes of the feet bilaterally.  Patient is amenable at this time to be referred to podiatry for further evaluation and management of her symptoms.  We discussed the possibility that she may also be experiencing some peripheral neuropathy.  The option of starting a medicine for neuropathic pain was also discussed, but deferred at this time to the patient's primary care provider.  She is agreeable to start on a once daily anti-inflammatory for pain relief.  Patient is discharged at this time to follow-up with podiatry as referred.  Haley Randall was evaluated in Emergency Department on 09/19/2019 for the symptoms described in the history of present illness. She was evaluated in the context of the global COVID-19 pandemic, which necessitated consideration that the patient might be at risk for infection with the SARS-CoV-2 virus that causes COVID-19. Institutional protocols and algorithms that pertain to the evaluation of patients at risk for COVID-19 are  in a state of rapid change based on information released by regulatory bodies including the CDC and federal and state organizations. These policies and algorithms were followed during the patient's care in the ED. ____________________________________________  FINAL CLINICAL IMPRESSION(S) / ED DIAGNOSES  Final diagnoses:  Foot pain, bilateral  Valgus deformity of both great toes      Rainee Sweatt, Dannielle Karvonen, PA-C 09/19/19 1930    Harvest Dark, MD 09/20/19 2120

## 2019-09-19 NOTE — ED Notes (Signed)
See triage note  Presents with pain to both ankle areas  States she developed pain to left ankle last Sunday  Then pain was intermittent and then she noticed pain to right ankle after ambulation  No swelling noted  Good pulses

## 2019-09-22 DIAGNOSIS — M2012 Hallux valgus (acquired), left foot: Secondary | ICD-10-CM | POA: Diagnosis not present

## 2019-09-22 DIAGNOSIS — M10072 Idiopathic gout, left ankle and foot: Secondary | ICD-10-CM | POA: Diagnosis not present

## 2019-09-22 DIAGNOSIS — M2042 Other hammer toe(s) (acquired), left foot: Secondary | ICD-10-CM | POA: Diagnosis not present

## 2019-09-22 DIAGNOSIS — M2011 Hallux valgus (acquired), right foot: Secondary | ICD-10-CM | POA: Diagnosis not present

## 2019-09-22 DIAGNOSIS — M2041 Other hammer toe(s) (acquired), right foot: Secondary | ICD-10-CM | POA: Diagnosis not present

## 2019-09-27 ENCOUNTER — Other Ambulatory Visit: Payer: Self-pay | Admitting: Internal Medicine

## 2019-09-27 DIAGNOSIS — I10 Essential (primary) hypertension: Secondary | ICD-10-CM

## 2019-10-06 DIAGNOSIS — M10072 Idiopathic gout, left ankle and foot: Secondary | ICD-10-CM | POA: Diagnosis not present

## 2019-10-26 ENCOUNTER — Other Ambulatory Visit: Payer: Self-pay

## 2019-11-01 ENCOUNTER — Other Ambulatory Visit: Payer: Self-pay | Admitting: Internal Medicine

## 2020-04-24 ENCOUNTER — Other Ambulatory Visit: Payer: Self-pay | Admitting: Internal Medicine

## 2020-06-18 ENCOUNTER — Other Ambulatory Visit: Payer: Self-pay | Admitting: Internal Medicine

## 2020-06-18 DIAGNOSIS — I10 Essential (primary) hypertension: Secondary | ICD-10-CM

## 2020-07-19 ENCOUNTER — Other Ambulatory Visit: Payer: Self-pay | Admitting: Internal Medicine

## 2020-08-30 ENCOUNTER — Ambulatory Visit (INDEPENDENT_AMBULATORY_CARE_PROVIDER_SITE_OTHER): Payer: Medicare Other | Admitting: Internal Medicine

## 2020-08-30 ENCOUNTER — Other Ambulatory Visit (INDEPENDENT_AMBULATORY_CARE_PROVIDER_SITE_OTHER): Payer: Medicare Other

## 2020-08-30 ENCOUNTER — Other Ambulatory Visit: Payer: Self-pay

## 2020-08-30 ENCOUNTER — Encounter: Payer: Self-pay | Admitting: Internal Medicine

## 2020-08-30 VITALS — BP 136/62 | HR 82 | Temp 97.7°F | Ht <= 58 in | Wt 119.2 lb

## 2020-08-30 DIAGNOSIS — M10079 Idiopathic gout, unspecified ankle and foot: Secondary | ICD-10-CM

## 2020-08-30 DIAGNOSIS — Z0000 Encounter for general adult medical examination without abnormal findings: Secondary | ICD-10-CM

## 2020-08-30 DIAGNOSIS — E7800 Pure hypercholesterolemia, unspecified: Secondary | ICD-10-CM | POA: Diagnosis not present

## 2020-08-30 DIAGNOSIS — I6523 Occlusion and stenosis of bilateral carotid arteries: Secondary | ICD-10-CM | POA: Diagnosis not present

## 2020-08-30 DIAGNOSIS — E559 Vitamin D deficiency, unspecified: Secondary | ICD-10-CM | POA: Diagnosis not present

## 2020-08-30 DIAGNOSIS — I10 Essential (primary) hypertension: Secondary | ICD-10-CM | POA: Diagnosis not present

## 2020-08-30 HISTORY — DX: Idiopathic gout, unspecified ankle and foot: M10.079

## 2020-08-30 LAB — VITAMIN D 25 HYDROXY (VIT D DEFICIENCY, FRACTURES): VITD: 23.93 ng/mL — ABNORMAL LOW (ref 30.00–100.00)

## 2020-08-30 LAB — LIPID PANEL
Cholesterol: 172 mg/dL (ref 0–200)
HDL: 65.8 mg/dL (ref >39.00)
LDL Cholesterol: 84 mg/dL (ref 0–99)
NonHDL: 106.07
Total CHOL/HDL Ratio: 3
Triglycerides: 109 mg/dL (ref 0.0–149.0)
VLDL: 21.8 mg/dL (ref 0.0–40.0)

## 2020-08-30 LAB — COMPREHENSIVE METABOLIC PANEL
ALT: 11 U/L (ref 0–35)
AST: 23 U/L (ref 0–37)
Albumin: 4.3 g/dL (ref 3.5–5.2)
Alkaline Phosphatase: 58 U/L (ref 39–117)
BUN: 16 mg/dL (ref 6–23)
CO2: 28 mEq/L (ref 19–32)
Calcium: 10.5 mg/dL (ref 8.4–10.5)
Chloride: 108 mEq/L (ref 96–112)
Creatinine, Ser: 1.28 mg/dL — ABNORMAL HIGH (ref 0.40–1.20)
GFR: 48.3 mL/min — ABNORMAL LOW (ref >60.00)
Glucose, Bld: 97 mg/dL (ref 70–99)
Potassium: 5.2 mEq/L — ABNORMAL HIGH (ref 3.5–5.1)
Sodium: 142 mEq/L (ref 135–145)
Total Bilirubin: 0.8 mg/dL (ref 0.2–1.2)
Total Protein: 6.9 g/dL (ref 6.0–8.3)

## 2020-08-30 LAB — CBC
HCT: 33.1 % — ABNORMAL LOW (ref 36.0–46.0)
Hemoglobin: 10.3 g/dL — ABNORMAL LOW (ref 12.0–15.0)
MCHC: 30.9 g/dL (ref 30.0–36.0)
MCV: 81.8 fl (ref 78.0–100.0)
Platelets: 205 10*3/uL (ref 150.0–400.0)
RBC: 4.05 Mil/uL (ref 3.87–5.11)
RDW: 15.5 % (ref 11.5–15.5)
WBC: 5.5 10*3/uL (ref 4.0–10.5)

## 2020-08-30 LAB — URIC ACID: Uric Acid, Serum: 8.3 mg/dL — ABNORMAL HIGH (ref 2.4–7.0)

## 2020-08-30 NOTE — Progress Notes (Signed)
HPI:  Patient presents the clinic today for her subsequent annual Medicare wellness exam.  She is also due to follow-up chronic conditions.  Gout: She reports one episode last year that occurred in both of her feet.  She reports she was seen at urgent care and given a prescription for Prednisone.  She denies recurrence at this time  HTN: Her BP today is 136/62.  She is taking Lisinopril as prescribed.  ECG from 12/2015 reviewed.  HLD: Her last LDL was 88, triglycerides 179, 08/2019.  She denies myalgias on Pravastatin.  She tries to consume a low-fat diet.  Carotid Atherosclerosis: She denies dizziness, headaches.  She is taking pravastatin and lisinopril as prescribed but did stop her aspirin secondary to nosebleeds.  Her last carotid ultrasound from 08/2018 reviewed.  Past Medical History:  Diagnosis Date  . Hyperlipidemia   . Hypertension     Current Outpatient Medications  Medication Sig Dispense Refill  . aspirin EC 81 MG tablet Take 1 tablet (81 mg total) by mouth daily. 30 tablet 2  . lisinopril (ZESTRIL) 40 MG tablet TAKE 1 TABLET BY MOUTH EVERY DAY 90 tablet 0  . pravastatin (PRAVACHOL) 80 MG tablet TAKE 1 TABLET BY MOUTH EVERY DAY 90 tablet 0   No current facility-administered medications for this visit.    No Known Allergies  Family History  Problem Relation Age of Onset  . Heart disease Mother   . Heart disease Father   . Stroke Maternal Grandmother   . Stroke Maternal Grandfather   . Heart disease Paternal Grandmother   . Heart disease Paternal Grandfather   . Cancer Neg Hx   . Diabetes Neg Hx     Social History   Socioeconomic History  . Marital status: Married    Spouse name: Not on file  . Number of children: Not on file  . Years of education: Not on file  . Highest education level: Not on file  Occupational History  . Not on file  Tobacco Use  . Smoking status: Former Research scientist (life sciences)  . Smokeless tobacco: Never Used  . Tobacco comment: quit 30 years ago   Vaping Use  . Vaping Use: Never used  Substance and Sexual Activity  . Alcohol use: Yes    Alcohol/week: 0.0 standard drinks    Comment: rare  . Drug use: No  . Sexual activity: Never  Other Topics Concern  . Not on file  Social History Narrative  . Not on file   Social Determinants of Health   Financial Resource Strain:   . Difficulty of Paying Living Expenses: Not on file  Food Insecurity:   . Worried About Charity fundraiser in the Last Year: Not on file  . Ran Out of Food in the Last Year: Not on file  Transportation Needs:   . Lack of Transportation (Medical): Not on file  . Lack of Transportation (Non-Medical): Not on file  Physical Activity:   . Days of Exercise per Week: Not on file  . Minutes of Exercise per Session: Not on file  Stress:   . Feeling of Stress : Not on file  Social Connections:   . Frequency of Communication with Friends and Family: Not on file  . Frequency of Social Gatherings with Friends and Family: Not on file  . Attends Religious Services: Not on file  . Active Member of Clubs or Organizations: Not on file  . Attends Archivist Meetings: Not on file  . Marital Status: Not  on file  Intimate Partner Violence:   . Fear of Current or Ex-Partner: Not on file  . Emotionally Abused: Not on file  . Physically Abused: Not on file  . Sexually Abused: Not on file    Hospitiliaztions: 09/2019, gout  Health Maintenance:    Flu: 08/2019  Tetanus: > 10 years ago  Pneumovax: Never  Prevnar: Never  Zostavax: Never  Shingrix: Never  Covid: Moderna  Mammogram: 03/2014  Pap Smear: No longer screening  Bone Density: > 5 years ago  Colon Screening: Never  Eye Doctor: as needed  Dental Exam: as needed   Providers:   PCP: Webb Silversmith, NP  Podiatry: Dr. Caryl Comes   I have personally reviewed and have noted:  1. The patient's medical and social history 2. Their use of alcohol, tobacco or illicit drugs 3. Their current medications and  supplements 4. The patient's functional ability including ADL's, fall risks, home safety risks and hearing or visual impairment. 5. Diet and physical activities 6. Evidence for depression or mood disorder  Subjective:   Review of Systems:   Constitutional: Denies fever, malaise, fatigue, headache or abrupt weight changes.  HEENT: Denies eye pain, eye redness, ear pain, ringing in the ears, wax buildup, runny nose, nasal congestion, bloody nose, or sore throat. Respiratory: Denies difficulty breathing, shortness of breath, cough or sputum production.   Cardiovascular: Denies chest pain, chest tightness, palpitations or swelling in the hands or feet.  Gastrointestinal: Denies abdominal pain, bloating, constipation, diarrhea or blood in the stool.  GU: Denies urgency, frequency, pain with urination, burning sensation, blood in urine, odor or discharge. Musculoskeletal: Denies decrease in range of motion, difficulty with gait, muscle pain or joint pain and swelling.  Skin: Denies redness, rashes, lesions or ulcercations.  Neurological: Denies dizziness, difficulty with memory, difficulty with speech or problems with balance and coordination.  Psych: Denies anxiety, depression, SI/HI.  No other specific complaints in a complete review of systems (except as listed in HPI above).  Objective:  PE:   BP 136/62 (BP Location: Left Arm, Patient Position: Sitting, Cuff Size: Normal)   Pulse 82   Temp 97.7 F (36.5 C) (Temporal)   Ht _0  (1.473 m)   Wt 119 lb 4 oz (54.1 kg)   SpO2 99%   BMI 24.92 kg/m   Wt Readings from Last 3 Encounters:  09/19/19 127 lb 13.9 oz (58 kg)  08/30/19 129 lb (58.5 kg)  09/07/18 122 lb (55.3 kg)    General: Appears her stated age, well developed, well nourished in NAD. Skin: Warm, dry and intact.  Vascular changes noted of BLE HEENT: Head: normal shape and size; Eyes: sclera white, no icterus, conjunctiva pink, PERRLA and EOMs intact;  Neck: Neck supple,  trachea midline. No masses, lumps or thyromegaly present.  Cardiovascular: Normal rate and rhythm. S1,S2 noted.  No murmur, rubs or gallops noted. No JVD or BLE edema.  Bilateral carotid bruits noted Pulmonary/Chest: Normal effort and positive vesicular breath sounds. No respiratory distress. No wheezes, rales or ronchi noted.  Abdomen: Soft and nontender. Normal bowel sounds. No distention or masses noted. Liver, spleen and kidneys non palpable. Musculoskeletal:  Strength 5/5 BUE/BLE. No signs of joint swelling.  Neurological: Alert and oriented. Cranial nerves II-XII grossly intact. Coordination normal.  Psychiatric: Mood and affect normal. Behavior is normal. Judgment and thought content normal.     BMET    Component Value Date/Time   NA 141 08/30/2019 1233   K 4.6 08/30/2019 1233  CL 105 08/30/2019 1233   CO2 29 08/30/2019 1233   GLUCOSE 85 08/30/2019 1233   BUN 15 08/30/2019 1233   CREATININE 1.28 (H) 08/30/2019 1233   CALCIUM 10.9 (H) 08/30/2019 1233    Lipid Panel     Component Value Date/Time   CHOL 189 08/30/2019 1233   TRIG 179.0 (H) 08/30/2019 1233   HDL 64.90 08/30/2019 1233   CHOLHDL 3 08/30/2019 1233   VLDL 35.8 08/30/2019 1233   LDLCALC 88 08/30/2019 1233    CBC    Component Value Date/Time   WBC 6.0 08/30/2019 1233   RBC 4.25 08/30/2019 1233   HGB 11.3 (L) 08/30/2019 1233   HCT 35.7 (L) 08/30/2019 1233   PLT 186.0 08/30/2019 1233   MCV 84.1 08/30/2019 1233   MCHC 31.6 08/30/2019 1233   RDW 14.7 08/30/2019 1233    Hgb A1C No results found for: HGBA1C    Assessment and Plan:   Medicare Annual Wellness Visit:  Diet: She eats lean meat. She consumes fruits and veggies daily. She tries to avoid fried foods. She drinks mostly water. Physical activity: Sedentary Depression/mood screen: Negative, PHQ 9 score of 0 Hearing: Intact to whispered voice Visual acuity: Grossly normal ADLs: Capable Fall risk: None Home safety: Good Cognitive  evaluation: Intact to orientation, naming, recall and repetition EOL planning: No adv directives, full code/ I agree  Preventative Medicine: Flu shot today.  Advised her if she gets bitten or cut she would need to go get a tetanus vaccine.  She declines Pneumovax, Prevnar, Zostavax, Shingrix.  Covid vaccine up-to-date.  She no longer wants to screen for breast cancer, cervical cancer, osteoporosis or colon cancer screening.  Encouraged her to consume a balanced diet and exercise regimen.  Advised her to see an eye doctor and dentist annually.  Will check CBC, C met, lipid, uric acid and vitamin D today.  Due dates for screening exams given to patient as part of her AVS.   Next appointment: 1 year, Medicare Wellness Exam  Webb Silversmith, NP This visit occurred during the SARS-CoV-2 public health emergency.  Safety protocols were in place, including screening questions prior to the visit, additional usage of staff PPE, and extensive cleaning of exam room while observing appropriate contact time as indicated for disinfecting solutions.

## 2020-08-30 NOTE — Assessment & Plan Note (Signed)
Controlled on Lisinopril, will continue Encouraged DASH diet C met today We will monitor

## 2020-08-30 NOTE — Assessment & Plan Note (Signed)
C met and lipid profile today Encouraged her to consume a low-fat diet Continue Pravastatin

## 2020-08-30 NOTE — Patient Instructions (Signed)

## 2020-08-30 NOTE — Assessment & Plan Note (Signed)
We will check uric acid level today

## 2020-08-30 NOTE — Assessment & Plan Note (Signed)
Will repeat carotid ultrasound Encouraged her to restart her ASA Continue Lisinopril and Pravastatin

## 2020-09-04 ENCOUNTER — Telehealth: Payer: Self-pay

## 2020-09-04 NOTE — Telephone Encounter (Signed)
Returning Brooke's call

## 2020-09-04 NOTE — Telephone Encounter (Signed)
Pt is returning a call regarding getting scheduled for VAS Carotid. She said she missed the call and would like a call back.

## 2020-09-05 MED ORDER — VITAMIN D (ERGOCALCIFEROL) 1.25 MG (50000 UNIT) PO CAPS: 50000.0000 [IU] | ORAL_CAPSULE | ORAL | 0 refills | Status: DC

## 2020-09-05 MED ORDER — AMLODIPINE BESYLATE 10 MG PO TABS: 10.0000 mg | ORAL_TABLET | Freq: Every day | ORAL | 2 refills | Status: DC

## 2020-09-05 MED ORDER — ALLOPURINOL 100 MG PO TABS: 50.0000 mg | ORAL_TABLET | Freq: Every day | ORAL | 0 refills | Status: DC

## 2020-09-05 NOTE — Telephone Encounter (Signed)
Attempted to reach patient - unable to reach. Left VM for patient to return phone call.

## 2020-09-05 NOTE — Addendum Note (Signed)
Addended by: Roena Malady on: 09/05/2020 04:15 PM   Modules accepted: Orders

## 2020-09-17 ENCOUNTER — Other Ambulatory Visit: Payer: Self-pay | Admitting: Internal Medicine

## 2020-09-17 DIAGNOSIS — I10 Essential (primary) hypertension: Secondary | ICD-10-CM

## 2020-09-20 ENCOUNTER — Telehealth: Payer: Self-pay | Admitting: Internal Medicine

## 2020-09-20 DIAGNOSIS — I10 Essential (primary) hypertension: Secondary | ICD-10-CM

## 2020-09-20 MED ORDER — PRAVASTATIN SODIUM 80 MG PO TABS
80.0000 mg | ORAL_TABLET | Freq: Every day | ORAL | 2 refills | Status: DC
Start: 2020-09-20 — End: 2021-07-30

## 2020-09-20 NOTE — Addendum Note (Signed)
Addended by: Roena Malady on: 09/20/2020 12:13 PM   Modules accepted: Orders

## 2020-09-20 NOTE — Telephone Encounter (Signed)
She was sent a letter with instructions. She was supposed to stop Lisinopril due to elevated potassium and start Amlodipine. Lisinopril was refilled by mistake and she should not pick this up. Has she been taking Amlodipine?

## 2020-09-20 NOTE — Telephone Encounter (Signed)
Spoke to pt and she is aware.

## 2020-09-20 NOTE — Telephone Encounter (Signed)
Patient came into office as she is confused if her Lisinopril is being D/c'd. Per previous note it is to be d/c'd but was sent in to pharmacy. Please clarify for patient.

## 2020-09-27 ENCOUNTER — Other Ambulatory Visit: Payer: Self-pay

## 2020-09-27 ENCOUNTER — Ambulatory Visit (INDEPENDENT_AMBULATORY_CARE_PROVIDER_SITE_OTHER): Payer: Medicare Other

## 2020-09-27 DIAGNOSIS — I6523 Occlusion and stenosis of bilateral carotid arteries: Secondary | ICD-10-CM

## 2020-10-04 NOTE — Progress Notes (Signed)
Patient advised of results, provider's concerns and advise. She verbalized understanding

## 2020-11-21 ENCOUNTER — Other Ambulatory Visit: Payer: Self-pay | Admitting: Internal Medicine

## 2020-11-26 ENCOUNTER — Telehealth: Payer: Self-pay | Admitting: Internal Medicine

## 2020-11-26 DIAGNOSIS — E559 Vitamin D deficiency, unspecified: Secondary | ICD-10-CM

## 2020-11-26 NOTE — Telephone Encounter (Addendum)
PT needs dose for Sunday, 12/02/20 no refills.  [facility] Pt phone [phone]

## 2020-11-26 NOTE — Addendum Note (Signed)
Addended by: Roena Malady on: 11/26/2020 11:36 AM   Modules accepted: Orders

## 2020-11-26 NOTE — Telephone Encounter (Signed)
PT states beginning 12/01/2020 new PRESCRIPTION DRUG PLAN:  : [employer]   RX ID: [id number] RX GROUP: [id number] RxBin: [id number] RxPCN: [id number]

## 2020-11-27 ENCOUNTER — Other Ambulatory Visit (INDEPENDENT_AMBULATORY_CARE_PROVIDER_SITE_OTHER): Payer: Medicare Other

## 2020-11-27 ENCOUNTER — Other Ambulatory Visit: Payer: Self-pay

## 2020-11-27 DIAGNOSIS — E559 Vitamin D deficiency, unspecified: Secondary | ICD-10-CM

## 2020-11-27 LAB — VITAMIN D 25 HYDROXY (VIT D DEFICIENCY, FRACTURES): VITD: 65.91 ng/mL (ref 30.00–100.00)

## 2020-12-02 ENCOUNTER — Other Ambulatory Visit: Payer: Self-pay | Admitting: Internal Medicine

## 2020-12-04 ENCOUNTER — Telehealth: Payer: Self-pay

## 2020-12-04 NOTE — Telephone Encounter (Signed)
Patient came into office requesting a refill on her medication . Says the pharmacy she needed to get in touch with provider office in order to get a refill    amLODipine (NORVASC) 10 MG tablet   [id number] [place] [place] - [address]

## 2021-02-26 ENCOUNTER — Other Ambulatory Visit: Payer: Self-pay | Admitting: Internal Medicine

## 2021-02-28 ENCOUNTER — Other Ambulatory Visit: Payer: Self-pay | Admitting: Internal Medicine

## 2021-03-04 ENCOUNTER — Encounter: Payer: Self-pay | Admitting: Internal Medicine

## 2021-03-04 ENCOUNTER — Other Ambulatory Visit: Payer: Self-pay

## 2021-03-04 ENCOUNTER — Ambulatory Visit (INDEPENDENT_AMBULATORY_CARE_PROVIDER_SITE_OTHER): Payer: Medicare Other | Admitting: Internal Medicine

## 2021-03-04 VITALS — BP 138/64 | HR 68 | Temp 97.4°F | Wt 110.0 lb

## 2021-03-04 DIAGNOSIS — D508 Other iron deficiency anemias: Secondary | ICD-10-CM | POA: Diagnosis not present

## 2021-03-04 DIAGNOSIS — N1831 Chronic kidney disease, stage 3a: Secondary | ICD-10-CM

## 2021-03-04 DIAGNOSIS — M10079 Idiopathic gout, unspecified ankle and foot: Secondary | ICD-10-CM

## 2021-03-04 DIAGNOSIS — E7800 Pure hypercholesterolemia, unspecified: Secondary | ICD-10-CM | POA: Diagnosis not present

## 2021-03-04 DIAGNOSIS — I6523 Occlusion and stenosis of bilateral carotid arteries: Secondary | ICD-10-CM | POA: Diagnosis not present

## 2021-03-04 DIAGNOSIS — I10 Essential (primary) hypertension: Secondary | ICD-10-CM | POA: Diagnosis not present

## 2021-03-04 LAB — CBC WITH DIFFERENTIAL/PLATELET
Basophils Absolute: 0 10*3/uL (ref 0.0–0.1)
Basophils Relative: 0.3 % (ref 0.0–3.0)
Eosinophils Absolute: 0.1 10*3/uL (ref 0.0–0.7)
Eosinophils Relative: 3.1 % (ref 0.0–5.0)
HCT: 37.8 % (ref 36.0–46.0)
Hemoglobin: 12.2 g/dL (ref 12.0–15.0)
Lymphocytes Relative: 35.6 % (ref 12.0–46.0)
Lymphs Abs: 1.7 10*3/uL (ref 0.7–4.0)
MCHC: 32.2 g/dL (ref 30.0–36.0)
MCV: 84.4 fl (ref 78.0–100.0)
Monocytes Absolute: 0.4 10*3/uL (ref 0.1–1.0)
Monocytes Relative: 8.1 % (ref 3.0–12.0)
Neutro Abs: 2.5 10*3/uL (ref 1.4–7.7)
Neutrophils Relative %: 52.9 % (ref 43.0–77.0)
Platelets: 182 10*3/uL (ref 150.0–400.0)
RBC: 4.48 Mil/uL (ref 3.87–5.11)
RDW: 14.6 % (ref 11.5–15.5)
WBC: 4.7 10*3/uL (ref 4.0–10.5)

## 2021-03-04 LAB — BASIC METABOLIC PANEL
BUN: 14 mg/dL (ref 6–23)
CO2: 29 mEq/L (ref 19–32)
Calcium: 10.2 mg/dL (ref 8.4–10.5)
Chloride: 106 mEq/L (ref 96–112)
Creatinine, Ser: 1.04 mg/dL (ref 0.40–1.20)
GFR: 50.03 mL/min — ABNORMAL LOW (ref >60.00)
Glucose, Bld: 88 mg/dL (ref 70–99)
Potassium: 4.6 mEq/L (ref 3.5–5.1)
Sodium: 141 mEq/L (ref 135–145)

## 2021-03-04 LAB — IBC PANEL
Iron: 64 ug/dL (ref 42–145)
Saturation Ratios: 17 % — ABNORMAL LOW (ref 20.0–50.0)
Transferrin: 269 mg/dL (ref 212.0–360.0)

## 2021-03-04 LAB — URIC ACID: Uric Acid, Serum: 5.4 mg/dL (ref 2.4–7.0)

## 2021-03-04 LAB — FERRITIN: Ferritin: 20.7 ng/mL (ref 10.0–291.0)

## 2021-03-04 NOTE — Assessment & Plan Note (Signed)
CBC with differential, IBC panel and ferritin today Continue oral iron OTC We will monitor

## 2021-03-04 NOTE — Patient Instructions (Signed)

## 2021-03-04 NOTE — Assessment & Plan Note (Signed)
Controlled on Amlodipine BMET today

## 2021-03-04 NOTE — Assessment & Plan Note (Signed)
Lipid profile reviewed Encouraged her to consume a low-fat diet Continue Pravastatin

## 2021-03-04 NOTE — Assessment & Plan Note (Signed)
She refuses to restart aspirin despite my concern for stroke given her carotid atherosclerosis Lipid profile reviewed Continue Pravastatin and Amlodipine

## 2021-03-04 NOTE — Assessment & Plan Note (Signed)
BMET today Not currently on ACEI/ARB

## 2021-03-04 NOTE — Assessment & Plan Note (Signed)
She is no longer taking Allopurinol secondary to nosebleeds Uric acid level today

## 2021-03-04 NOTE — Progress Notes (Signed)
Subjective:    Patient ID: Haley Randall, female    DOB: [birth year], 85 y.o.   MRN: [id number]  HPI  9 patient presents to the clinic today for 75-month follow-up of chronic conditions.  HTN: Her BP today is 136/84.  She is taking Amlodipine as prescribed.  ECG from 12/2015 reviewed.  HLD: Her last LDL was 84, triglycerides 644, 08/2020. She denies myalgias on Pravastatin. She tries to consume a low fat diet.  Carotid Atherosclerosis: Managed on Pravastatin, Amlodipine but did stop ASA due to nose bleeds. Carotid ultrasound from 08/2018 reviewed.  IDA: Her last H/H was 33.1/10.9, 08/2020. She was started on an oral iron supplement which she is taking as prescribed.  Gout: She denies recent flare. She stopped Allopurinol due to nose bleeds.  CKD 3: Her last Creatinine was 1.28, GFR 48.30. She is not on any ACEI/ARB at this time.  Review of Systems      Past Medical History:  Diagnosis Date  . Hyperlipidemia   . Hypertension     Current Outpatient Medications  Medication Sig Dispense Refill  . allopurinol (ZYLOPRIM) 100 MG tablet TAKE 1/2 TABLET BY MOUTH EVERY DAY 45 tablet 0  . amLODipine (NORVASC) 10 MG tablet TAKE 1 TABLET BY MOUTH EVERY DAY 90 tablet 0  . aspirin EC 81 MG tablet Take 1 tablet (81 mg total) by mouth daily. (Patient not taking: Reported on 08/30/2020) 30 tablet 2  . pravastatin (PRAVACHOL) 80 MG tablet Take 1 tablet (80 mg total) by mouth daily. 90 tablet 2  . Vitamin D, Ergocalciferol, (DRISDOL) 1.25 MG (50000 UNIT) CAPS capsule Take 1 capsule (50,000 Units total) by mouth every 7 (seven) days. 12 capsule 0   No current facility-administered medications for this visit.    No Known Allergies  Family History  Problem Relation Age of Onset  . Heart disease Mother   . Heart disease Father   . Stroke Maternal Grandmother   . Stroke Maternal Grandfather   . Heart disease Paternal Grandmother   . Heart disease Paternal Grandfather   . Cancer Neg Hx   .  Diabetes Neg Hx     Social History   Socioeconomic History  . Marital status: Married    Spouse name: Not on file  . Number of children: Not on file  . Years of education: Not on file  . Highest education level: Not on file  Occupational History  . Not on file  Tobacco Use  . Smoking status: Former Games developer  . Smokeless tobacco: Never Used  . Tobacco comment: quit 30 years ago  Vaping Use  . Vaping Use: Never used  Substance and Sexual Activity  . Alcohol use: Yes    Alcohol/week: 0.0 standard drinks    Comment: rare  . Drug use: No  . Sexual activity: Never  Other Topics Concern  . Not on file  Social History Narrative  . Not on file   Social Determinants of Health   Financial Resource Strain: Not on file  Food Insecurity: Not on file  Transportation Needs: Not on file  Physical Activity: Not on file  Stress: Not on file  Social Connections: Not on file  Intimate Partner Violence: Not on file     Constitutional: Denies fever, malaise, fatigue, headache or abrupt weight changes.  Respiratory: Denies difficulty breathing, shortness of breath, cough or sputum production.   Cardiovascular: Denies chest pain, chest tightness, palpitations or swelling in the hands or feet.  GU: Denies urgency, frequency,  pain with urination, burning sensation, blood in urine, odor or discharge. Skin: Denies redness, rashes, lesions or ulcercations.  Neurological: Denies dizziness, difficulty with memory, difficulty with speech or problems with balance and coordination.    No other specific complaints in a complete review of systems (except as listed in HPI above).  Objective:   Physical Exam  BP 138/64   Pulse 68   Temp (!) 97.4 F (36.3 C) (Temporal)   Wt 110 lb (49.9 kg)   SpO2 98%   BMI 22.99 kg/m   Wt Readings from Last 3 Encounters:  08/30/20 119 lb 4 oz (54.1 kg)  09/19/19 127 lb 13.9 oz (58 kg)  08/30/19 129 lb (58.5 kg)    General: Appears her stated age, well  developed, well nourished in NAD. Skin: Warm, dry and intact. No rashesnoted. HEENT: Head: normal shape and size; Eyes: sclera white, no icterus, conjunctiva pink, PERRLA and EOMs intact;  Cardiovascular: Normal rate and rhythm. S1,S2 noted.  No murmur, rubs or gallops noted. No JVD or BLE edema. Bilateral carotid bruits noted. Pulmonary/Chest: Normal effort and positive vesicular breath sounds. No respiratory distress. No wheezes, rales or ronchi noted.  Musculoskeletal: No difficulty with gait.  Neurological: Alert and oriented.   BMET    Component Value Date/Time   NA 142 08/30/2020 1113   K 5.2 No hemolysis seen (H) 08/30/2020 1113   CL 108 08/30/2020 1113   CO2 28 08/30/2020 1113   GLUCOSE 97 08/30/2020 1113   BUN 16 08/30/2020 1113   CREATININE 1.28 (H) 08/30/2020 1113   CALCIUM 10.5 08/30/2020 1113    Lipid Panel     Component Value Date/Time   CHOL 172 08/30/2020 1113   TRIG 109.0 08/30/2020 1113   HDL 65.80 08/30/2020 1113   CHOLHDL 3 08/30/2020 1113   VLDL 21.8 08/30/2020 1113   LDLCALC 84 08/30/2020 1113    CBC    Component Value Date/Time   WBC 5.5 08/30/2020 1113   RBC 4.05 08/30/2020 1113   HGB 10.3 (L) 08/30/2020 1113   HCT 33.1 (L) 08/30/2020 1113   PLT 205.0 08/30/2020 1113   MCV 81.8 08/30/2020 1113   MCHC 30.9 08/30/2020 1113   RDW 15.5 08/30/2020 1113    Hgb A1C No results found for: HGBA1C          Assessment & Plan:    Nicki Reaper, NP This visit occurred during the SARS-CoV-2 public health emergency.  Safety protocols were in place, including screening questions prior to the visit, additional usage of staff PPE, and extensive cleaning of exam room while observing appropriate contact time as indicated for disinfecting solutions.

## 2021-05-29 ENCOUNTER — Other Ambulatory Visit: Payer: Self-pay | Admitting: Internal Medicine

## 2021-07-26 ENCOUNTER — Other Ambulatory Visit: Payer: Self-pay | Admitting: Internal Medicine

## 2021-07-26 NOTE — Telephone Encounter (Signed)
   Notes to clinic Not a pt in this practice.  

## 2021-07-30 NOTE — Telephone Encounter (Signed)
Refill request Pravastatin Last office visit 03/04/21 No upcoming appointment Last refill 09/20/20 #90/2

## 2021-08-24 ENCOUNTER — Other Ambulatory Visit: Payer: Self-pay | Admitting: Family

## 2021-08-28 ENCOUNTER — Other Ambulatory Visit: Payer: Self-pay | Admitting: Internal Medicine

## 2021-08-28 MED ORDER — AMLODIPINE BESYLATE 10 MG PO TABS: 10.0000 mg | ORAL_TABLET | Freq: Every day | ORAL | 0 refills | Status: DC

## 2021-08-28 NOTE — Telephone Encounter (Signed)
  Encourage patient to contact the pharmacy for refills or they can request refills through [facility]  LAST APPOINTMENT DATE:  Please schedule appointment if longer than 1 year  NEXT APPOINTMENT DATE: 11/19/21 (TOC GV)  MEDICATION: amLODipine (NORVASC) 10 MG tablet  Is the patient out of medication? YES  [facility] [place]   Let patient know to contact pharmacy at the end of the day to make sure medication is ready.  Please notify patient to allow 48-72 hours to process  CLINICAL FILLS OUT ALL BELOW:   LAST REFILL:  QTY:  REFILL DATE:    OTHER COMMENTS:    Okay for refill?  Please advise

## 2021-08-28 NOTE — Telephone Encounter (Signed)
Refill request Norvasc Last refill 05/29/21 #90 Last office visit 03/04/21 Upcoming appointment Dr. Yetta Barre 11/19/21

## 2021-09-02 ENCOUNTER — Ambulatory Visit: Payer: Medicare Other | Admitting: Internal Medicine

## 2021-10-29 ENCOUNTER — Other Ambulatory Visit: Payer: Self-pay | Admitting: Internal Medicine

## 2021-10-29 NOTE — Telephone Encounter (Signed)
Name of Medication: Pravastatin 80 mg Name of [facility]acy: [facility] [place] Last Fill or Written Date and Quantity: # 90 on 07/30/21 Last Office Visit and Type: 03/04/21 for 6 mth FU Next Office Visit and Type: 11/19/21 TOC to Dr Etta Grandchild  Sending note to [facility] Pool

## 2021-10-29 NOTE — Telephone Encounter (Signed)
  Encourage patient to contact the pharmacy for refills or they can request refills through [facility]  LAST APPOINTMENT DATE:  Please schedule appointment if longer than 1 year  NEXT APPOINTMENT DATE:no future appt  MEDICATION:pravastatin (PRAVACHOL) 80 MG tablet  Is the patient out of medication? 5 pills left  [id number] [place],  - [address]  Let patient know to contact pharmacy at the end of the day to make sure medication is ready.  Please notify patient to allow 48-72 hours to process  CLINICAL FILLS OUT ALL BELOW:   LAST REFILL:  QTY:  REFILL DATE:    OTHER COMMENTS:    Okay for refill?  Please advise

## 2021-10-30 MED ORDER — PRAVASTATIN SODIUM 80 MG PO TABS: 80.0000 mg | ORAL_TABLET | Freq: Every day | ORAL | 0 refills | Status: DC

## 2021-11-19 ENCOUNTER — Encounter: Payer: Self-pay | Admitting: Internal Medicine

## 2021-11-19 ENCOUNTER — Ambulatory Visit (INDEPENDENT_AMBULATORY_CARE_PROVIDER_SITE_OTHER): Payer: Medicare Other | Admitting: Internal Medicine

## 2021-11-19 ENCOUNTER — Other Ambulatory Visit: Payer: Self-pay

## 2021-11-19 VITALS — BP 146/60 | HR 68 | Temp 98.2°F | Ht <= 58 in | Wt 112.0 lb

## 2021-11-19 DIAGNOSIS — N1831 Chronic kidney disease, stage 3a: Secondary | ICD-10-CM | POA: Diagnosis not present

## 2021-11-19 DIAGNOSIS — E785 Hyperlipidemia, unspecified: Secondary | ICD-10-CM | POA: Diagnosis not present

## 2021-11-19 DIAGNOSIS — I10 Essential (primary) hypertension: Secondary | ICD-10-CM | POA: Insufficient documentation

## 2021-11-19 DIAGNOSIS — L219 Seborrheic dermatitis, unspecified: Secondary | ICD-10-CM | POA: Diagnosis not present

## 2021-11-19 DIAGNOSIS — Z23 Encounter for immunization: Secondary | ICD-10-CM | POA: Diagnosis not present

## 2021-11-19 DIAGNOSIS — I6523 Occlusion and stenosis of bilateral carotid arteries: Secondary | ICD-10-CM

## 2021-11-19 LAB — URINALYSIS, ROUTINE W REFLEX MICROSCOPIC
Bilirubin Urine: NEGATIVE
Hgb urine dipstick: NEGATIVE
Ketones, ur: NEGATIVE
Leukocytes,Ua: NEGATIVE
Nitrite: NEGATIVE
RBC / HPF: NONE SEEN (ref 0–?)
Specific Gravity, Urine: <=1.005 — AB (ref 1.000–1.030)
Total Protein, Urine: NEGATIVE
Urine Glucose: NEGATIVE
Urobilinogen, UA: 0.2 (ref 0.0–1.0)
WBC, UA: NONE SEEN (ref 0–?)
pH: 6 (ref 5.0–8.0)

## 2021-11-19 LAB — CBC WITH DIFFERENTIAL/PLATELET
Basophils Absolute: 0 10*3/uL (ref 0.0–0.1)
Basophils Relative: 0.7 % (ref 0.0–3.0)
Eosinophils Absolute: 0 10*3/uL (ref 0.0–0.7)
Eosinophils Relative: 0.9 % (ref 0.0–5.0)
HCT: 38.9 % (ref 36.0–46.0)
Hemoglobin: 12.2 g/dL (ref 12.0–15.0)
Lymphocytes Relative: 32.2 % (ref 12.0–46.0)
Lymphs Abs: 1.4 10*3/uL (ref 0.7–4.0)
MCHC: 31.4 g/dL (ref 30.0–36.0)
MCV: 86.2 fl (ref 78.0–100.0)
Monocytes Absolute: 0.4 10*3/uL (ref 0.1–1.0)
Monocytes Relative: 8.2 % (ref 3.0–12.0)
Neutro Abs: 2.6 10*3/uL (ref 1.4–7.7)
Neutrophils Relative %: 58 % (ref 43.0–77.0)
Platelets: 206 10*3/uL (ref 150.0–400.0)
RBC: 4.51 Mil/uL (ref 3.87–5.11)
RDW: 13.4 % (ref 11.5–15.5)
WBC: 4.5 10*3/uL (ref 4.0–10.5)

## 2021-11-19 LAB — HEPATIC FUNCTION PANEL
ALT: 16 U/L (ref 0–35)
AST: 28 U/L (ref 0–37)
Albumin: 4.3 g/dL (ref 3.5–5.2)
Alkaline Phosphatase: 67 U/L (ref 39–117)
Bilirubin, Direct: 0.2 mg/dL (ref 0.0–0.3)
Total Bilirubin: 0.9 mg/dL (ref 0.2–1.2)
Total Protein: 7.6 g/dL (ref 6.0–8.3)

## 2021-11-19 LAB — BASIC METABOLIC PANEL
BUN: 12 mg/dL (ref 6–23)
CO2: 27 mEq/L (ref 19–32)
Calcium: 10.3 mg/dL (ref 8.4–10.5)
Chloride: 104 mEq/L (ref 96–112)
Creatinine, Ser: 1.12 mg/dL (ref 0.40–1.20)
GFR: 45.54 mL/min — ABNORMAL LOW (ref >60.00)
Glucose, Bld: 87 mg/dL (ref 70–99)
Potassium: 4.2 mEq/L (ref 3.5–5.1)
Sodium: 140 mEq/L (ref 135–145)

## 2021-11-19 LAB — LIPID PANEL
Cholesterol: 200 mg/dL (ref 0–200)
HDL: 92.5 mg/dL (ref >39.00)
LDL Cholesterol: 95 mg/dL (ref 0–99)
NonHDL: 107.17
Total CHOL/HDL Ratio: 2
Triglycerides: 62 mg/dL (ref 0.0–149.0)
VLDL: 12.4 mg/dL (ref 0.0–40.0)

## 2021-11-19 LAB — TSH: TSH: 4.25 u[IU]/mL (ref 0.35–5.50)

## 2021-11-19 MED ORDER — FLUOCINOLONE ACETONIDE BODY 0.01 % EX OIL: 1.0000 | TOPICAL_OIL | CUTANEOUS | 1 refills | Status: DC

## 2021-11-19 MED ORDER — KETOCONAZOLE 2 % EX SHAM: 1.0000 "application " | MEDICATED_SHAMPOO | CUTANEOUS | 1 refills | Status: DC

## 2021-11-19 NOTE — Progress Notes (Signed)
Subjective:  Patient ID: Haley Randall, female    DOB: 08/30/1936  Age: 85 y.o. MRN: [id number]  CC: Rash, Hypertension, and Hyperlipidemia  This visit occurred during the SARS-CoV-2 public health emergency.  Safety protocols were in place, including screening questions prior to the visit, additional usage of staff PPE, and extensive cleaning of exam room while observing appropriate contact time as indicated for disinfecting solutions.    HPI Meda Pascuzzi presents for f/up and to establish.  She complains of a several month history of itching and flaking on her scalp.  Years ago when she lived in [place] this was treated with a shampoo and she had a good response.  She does not know what the name of the shampoo was.  She has hypertension and is active.  She denies chest pain, shortness of breath, diaphoresis, dizziness, lightheadedness, or edema.  History Shaleah has a past medical history of Hyperlipidemia and Hypertension.   She has a past surgical history that includes Breast surgery (Left, 1980).   Her family history includes Heart disease in her father, mother, paternal grandfather, and paternal grandmother; Stroke in her maternal grandfather and maternal grandmother.She reports that she has quit smoking. She has never used smokeless tobacco. She reports current alcohol use. She reports that she does not use drugs.  Outpatient Medications Prior to Visit  Medication Sig Dispense Refill   amLODipine (NORVASC) 10 MG tablet Take 1 tablet (10 mg total) by mouth daily. 90 tablet 0   ferrous sulfate 325 (65 FE) MG tablet Take 325 mg by mouth daily with breakfast.     pravastatin (PRAVACHOL) 80 MG tablet Take 1 tablet (80 mg total) by mouth daily. 90 tablet 0   No facility-administered medications prior to visit.    ROS Review of Systems  Constitutional:  Negative for appetite change, diaphoresis, fatigue and unexpected weight change.  HENT: Negative.    Eyes: Negative.   Respiratory:   Negative for cough, chest tightness, shortness of breath and wheezing.   Cardiovascular:  Negative for chest pain, palpitations and leg swelling.  Gastrointestinal:  Negative for abdominal pain, constipation, diarrhea, nausea and vomiting.  Endocrine: Negative.   Genitourinary: Negative.  Negative for difficulty urinating.  Musculoskeletal: Negative.  Negative for arthralgias and myalgias.  Skin:  Positive for rash.  Neurological:  Negative for dizziness, weakness and light-headedness.  Hematological:  Negative for adenopathy. Does not bruise/bleed easily.  Psychiatric/Behavioral: Negative.     Objective:  BP (!) 146/60 (BP Location: Left Arm, Patient Position: Sitting, Cuff Size: Large)    Pulse 68    Temp 98.2 F (36.8 C) (Oral)    Ht 4\' 10"  (1.473 m)    Wt 112 lb (50.8 kg)    SpO2 99%    BMI 23.41 kg/m   Physical Exam Vitals reviewed.  HENT:     Head:      Comments: There is frontal balding but no alopecia. Over the posterior scalp there is flaking with thick greasy scale and faint erythema. There is no induration, exudate, streaking, or tenderness    Nose: Nose normal.     Mouth/Throat:     Mouth: Mucous membranes are moist.  Eyes:     General: No scleral icterus.    Conjunctiva/sclera: Conjunctivae normal.  Cardiovascular:     Rate and Rhythm: Normal rate and regular rhythm.     Heart sounds: No murmur heard. Pulmonary:     Effort: Pulmonary effort is normal.     Breath sounds:  No stridor. No wheezing, rhonchi or rales.  Abdominal:     General: Abdomen is flat.     Tenderness: There is no abdominal tenderness. There is no guarding.     Hernia: No hernia is present.  Musculoskeletal:        General: Normal range of motion.     Cervical back: Neck supple.  Lymphadenopathy:     Cervical: No cervical adenopathy.  Skin:    General: Skin is warm.     Findings: Rash present.  Neurological:     General: No focal deficit present.     Mental Status: She is alert.   Psychiatric:        Mood and Affect: Mood normal.        Behavior: Behavior normal.    Lab Results  Component Value Date   WBC 4.5 11/19/2021   HGB 12.2 11/19/2021   HCT 38.9 11/19/2021   PLT 206.0 11/19/2021   GLUCOSE 87 11/19/2021   CHOL 200 11/19/2021   TRIG 62.0 11/19/2021   HDL 92.50 11/19/2021   LDLCALC 95 11/19/2021   ALT 16 11/19/2021   AST 28 11/19/2021   NA 140 11/19/2021   K 4.2 11/19/2021   CL 104 11/19/2021   CREATININE 1.12 11/19/2021   BUN 12 11/19/2021   CO2 27 11/19/2021   TSH 4.25 11/19/2021     Assessment & Plan:   Jeanean was seen today for rash, hypertension and hyperlipidemia.  Diagnoses and all orders for this visit:  Primary hypertension- Her blood pressure is adequately well controlled. -     CBC with Differential/Platelet; Future -     Basic metabolic panel; Future -     Urinalysis, Routine w reflex microscopic; Future -     Hepatic function panel; Future -     Hepatic function panel -     Urinalysis, Routine w reflex microscopic -     Basic metabolic panel -     CBC with Differential/Platelet  Hyperlipidemia with target LDL less than 130- LDL goal achieved. Doing well on the statin  -     Lipid panel; Future -     TSH; Future -     Hepatic function panel; Future -     Hepatic function panel -     TSH -     Lipid panel  Seborrheic dermatitis of scalp- Will treat the infection with ketoconazole shampoo.  Will reduce the symptom burden with topical fluocinolone. -     ketoconazole (NIZORAL) 2 % shampoo; Apply 1 application topically 2 (two) times a week. -     Fluocinolone Acetonide Body (DERMA-SMOOTHE/FS BODY) 0.01 % OIL; Apply 1 Act topically every 3 (three) days.  Flu vaccine need -     Flu Vaccine QUAD High Dose(Fluad)  Stage 3a chronic kidney disease (HCC)- She will avoid nephrotoxic agents.  Other orders -     Pneumococcal polysaccharide vaccine 23-valent greater than or equal to 2yo subcutaneous/IM   I am having Elonda Husky start on ketoconazole and Fluocinolone Acetonide Body. I am also having her maintain her ferrous sulfate, amLODipine, and pravastatin.  Meds ordered this encounter  Medications   ketoconazole (NIZORAL) 2 % shampoo    Sig: Apply 1 application topically 2 (two) times a week.    Dispense:  120 mL    Refill:  1   Fluocinolone Acetonide Body (DERMA-SMOOTHE/FS BODY) 0.01 % OIL    Sig: Apply 1 Act topically every 3 (three) days.  Dispense:  118.28 mL    Refill:  1     Follow-up: Return in about 3 months (around 02/17/2022).  Scarlette Calico, MD

## 2021-11-19 NOTE — Patient Instructions (Signed)
Seborrheic Dermatitis, Adult Seborrheic dermatitis is a skin disease that causes red, scaly patches. It usually occurs on the scalp, and it is often called dandruff. The patches may appear on other parts of the body. Skin patches tend to appear where there are many oil glands in the skin. Areas of the body that are commonly affected include the: Scalp. Ears. Eyebrows. Face. Bearded area of men's faces. Skin folds of the body, such as the armpits, groin, and buttocks. Chest. The condition may come and go for no known reason, and it is often long-lasting (chronic). What are the causes? The cause of this condition is not known. What increases the risk? The following factors may make you more likely to develop this condition: Having certain conditions, such as: HIV (human immunodeficiency virus). AIDS (acquired immunodeficiency syndrome). Parkinson's disease. Mood disorders, such as depression. Being 40-60 years old. What are the signs or symptoms? Symptoms of this condition include: Thick scales on the scalp. Redness on the face or in the armpits. Skin that is flaky. The flakes may be white or yellow. Skin that seems oily or dry but is not helped with moisturizers. Itching or burning in the affected areas. How is this diagnosed? This condition is diagnosed with a medical history and physical exam. A sample of your skin may be tested (skin biopsy). You may need to see a skin specialist (dermatologist). How is this treated? There is no cure for this condition, but treatment can help to manage the symptoms. You may get treatment to remove scales, lower the risk of skin infection, and reduce swelling or itching. Treatment may include: Creams that reduce skin yeast. Medicated shampoo. Moisturizing creams or ointments. Creams that reduce swelling and irritation (steroids). Follow these instructions at home: Apply over-the-counter and prescription medicines only as told by your health care  provider. Use any medicated shampoo, skin creams, or ointments only as told by your health care provider. Keep all follow-up visits as told by your health care provider. This is important. Contact a health care provider if: Your symptoms do not improve with treatment. Your symptoms get worse. You have new symptoms. Get help right away if: Your condition rapidly worsens with treatment. Summary Seborrheic dermatitis is a skin disease that causes red, scaly patches. Seborrheic dermatitis commonly affects the scalp, face, and skin folds. There is no cure for this condition, but treatment can help to manage the symptoms. This information is not intended to replace advice given to you by your health care provider. Make sure you discuss any questions you have with your health care provider. Document Revised: 08/25/2019 Document Reviewed: 08/25/2019 Elsevier Patient Education  2022 Elsevier Inc.  

## 2021-12-07 ENCOUNTER — Other Ambulatory Visit: Payer: Self-pay | Admitting: Internal Medicine

## 2022-02-10 ENCOUNTER — Telehealth: Payer: Self-pay | Admitting: Family Medicine

## 2022-02-10 ENCOUNTER — Telehealth: Payer: Self-pay | Admitting: Internal Medicine

## 2022-02-10 ENCOUNTER — Other Ambulatory Visit: Payer: Self-pay | Admitting: Internal Medicine

## 2022-02-10 DIAGNOSIS — E785 Hyperlipidemia, unspecified: Secondary | ICD-10-CM

## 2022-02-10 MED ORDER — PRAVASTATIN SODIUM 80 MG PO TABS: 80.0000 mg | ORAL_TABLET | Freq: Every day | ORAL | 0 refills | Status: DC

## 2022-02-10 NOTE — Telephone Encounter (Signed)
1.Medication Requested: pravastatin (PRAVACHOL) 80 MG tablet ? ?2. Pharmacy (Name, Street, [phone] [place], [place] - [address]  ?Phone:  [phone] ?Fax:  [phone] ? ? ?3. On Med List: yes ? ?4. Last Visit with PCP: 12.20.22 ? ?5. Next visit date with PCP: n/a ? ? ?Agent: Please be advised that RX refills may take up to 3 business days. We ask that you follow-up with your pharmacy.  ?

## 2022-02-10 NOTE — Telephone Encounter (Signed)
Pt dropped off new pt pack ?

## 2022-03-06 ENCOUNTER — Telehealth: Payer: Self-pay | Admitting: Internal Medicine

## 2022-03-06 ENCOUNTER — Other Ambulatory Visit: Payer: Self-pay | Admitting: Internal Medicine

## 2022-03-06 DIAGNOSIS — I10 Essential (primary) hypertension: Secondary | ICD-10-CM

## 2022-03-06 MED ORDER — AMLODIPINE BESYLATE 10 MG PO TABS: 10.0000 mg | ORAL_TABLET | Freq: Every day | ORAL | 1 refills | Status: DC

## 2022-03-06 NOTE — Telephone Encounter (Signed)
1.Medication Requested: amLODipine (NORVASC) 10 MG tablet ? ?2. Pharmacy (Name, Street, [phone] [place], [place] - [address] ? ?3. On Med List: Y ? ?4. Last Visit with PCP: 11-19-2021 ? ?5. Next visit date with PCP: n/a ? ? ?Agent: Please be advised that RX refills may take up to 3 business days. We ask that you follow-up with your pharmacy.  ?

## 2022-04-07 ENCOUNTER — Telehealth: Payer: Self-pay

## 2022-04-07 NOTE — Telephone Encounter (Signed)
Called pt to schedule AWV. Pt states she is transferring care to a new Dr and she will have that office handle them from now on. ?

## 2022-05-10 ENCOUNTER — Other Ambulatory Visit: Payer: Self-pay | Admitting: Internal Medicine

## 2022-05-10 DIAGNOSIS — E785 Hyperlipidemia, unspecified: Secondary | ICD-10-CM

## 2022-05-23 ENCOUNTER — Ambulatory Visit (INDEPENDENT_AMBULATORY_CARE_PROVIDER_SITE_OTHER): Payer: Medicare Other | Admitting: Family Medicine

## 2022-05-23 ENCOUNTER — Encounter: Payer: Self-pay | Admitting: Family Medicine

## 2022-05-23 VITALS — BP 152/50 | HR 60 | Temp 97.0°F | Ht <= 58 in | Wt 113.0 lb

## 2022-05-23 DIAGNOSIS — E7800 Pure hypercholesterolemia, unspecified: Secondary | ICD-10-CM

## 2022-05-23 DIAGNOSIS — I10 Essential (primary) hypertension: Secondary | ICD-10-CM

## 2022-05-23 MED ORDER — LISINOPRIL 10 MG PO TABS: 10.0000 mg | ORAL_TABLET | Freq: Every day | ORAL | 0 refills | Status: DC

## 2022-05-23 NOTE — Assessment & Plan Note (Signed)
Lab Results  Component Value Date   CHOL 200 11/19/2021   HDL 92.50 11/19/2021   LDLCALC 95 11/19/2021   TRIG 62.0 11/19/2021   CHOLHDL 2 11/19/2021   Elevated but reasonable. Cont pravastatin 80 mg.

## 2022-06-05 ENCOUNTER — Ambulatory Visit (INDEPENDENT_AMBULATORY_CARE_PROVIDER_SITE_OTHER): Payer: Medicare Other

## 2022-06-05 VITALS — Ht <= 58 in | Wt 113.0 lb

## 2022-06-05 DIAGNOSIS — Z0000 Encounter for general adult medical examination without abnormal findings: Secondary | ICD-10-CM | POA: Diagnosis not present

## 2022-06-05 NOTE — Patient Instructions (Addendum)
[name] , Thank you for taking time to come for your Medicare Wellness Visit. I appreciate your ongoing commitment to your health goals. Please review the following plan we discussed and let me know if I can assist you in the future.   These are the goals we discussed:  Goals       No current goals (pt-stated)        This is a list of the screening recommended for you and due dates:  Health Maintenance  Topic Date Due   COVID-19 Vaccine (3 - Moderna series) 03/20/2020   Zoster (Shingles) Vaccine (2 of 2) 09/05/2022*   DEXA scan (bone density measurement)  06/06/2023*   Tetanus Vaccine  06/06/2023*   Flu Shot  07/01/2022   Pneumonia Vaccine (2 - PCV) 11/19/2022   HPV Vaccine  Aged Out  *Topic was postponed. The date shown is not the original due date.    Advanced directives: Yes  Conditions/risks identified: None  Next appointment: Follow up in one year for your annual wellness visit     Preventive Care 65 Years and Older, Female Preventive care refers to lifestyle choices and visits with your health care provider that can promote health and wellness. What does preventive care include? A yearly physical exam. This is also called an annual well check. Dental exams once or twice a year. Routine eye exams. Ask your health care provider how often you should have your eyes checked. Personal lifestyle choices, including: Daily care of your teeth and gums. Regular physical activity. Eating a healthy diet. Avoiding tobacco and drug use. Limiting alcohol use. Practicing safe sex. Taking low-dose aspirin every day. Taking vitamin and mineral supplements as recommended by your health care provider. What happens during an annual well check? The services and screenings done by your health care provider during your annual well check will depend on your age, overall health, lifestyle risk factors, and family history of disease. Counseling  Your health care provider may ask you  questions about your: Alcohol use. Tobacco use. Drug use. Emotional well-being. Home and relationship well-being. Sexual activity. Eating habits. History of falls. Memory and ability to understand (cognition). Work and work Astronomer. Reproductive health. Screening  You may have the following tests or measurements: Height, weight, and BMI. Blood pressure. Lipid and cholesterol levels. These may be checked every 5 years, or more frequently if you are over 73 years old. Skin check. Lung cancer screening. You may have this screening every year starting at age 53 if you have a 30-pack-year history of smoking and currently smoke or have quit within the past 15 years. Fecal occult blood test (FOBT) of the stool. You may have this test every year starting at age 69. Flexible sigmoidoscopy or colonoscopy. You may have a sigmoidoscopy every 5 years or a colonoscopy every 10 years starting at age 95. Hepatitis C blood test. Hepatitis B blood test. Sexually transmitted disease (STD) testing. Diabetes screening. This is done by checking your blood sugar (glucose) after you have not eaten for a while (fasting). You may have this done every 1-3 years. Bone density scan. This is done to screen for osteoporosis. You may have this done starting at age 42. Mammogram. This may be done every 1-2 years. Talk to your health care provider about how often you should have regular mammograms. Talk with your health care provider about your test results, treatment options, and if necessary, the need for more tests. Vaccines  Your health care provider may recommend  certain vaccines, such as: Influenza vaccine. This is recommended every year. Tetanus, diphtheria, and acellular pertussis (Tdap, Td) vaccine. You may need a Td booster every 10 years. Zoster vaccine. You may need this after age 73. Pneumococcal 13-valent conjugate (PCV13) vaccine. One dose is recommended after age 32. Pneumococcal polysaccharide  (PPSV23) vaccine. One dose is recommended after age 72. Talk to your health care provider about which screenings and vaccines you need and how often you need them. This information is not intended to replace advice given to you by your health care provider. Make sure you discuss any questions you have with your health care provider. Document Released: 12/14/2015 Document Revised: 08/06/2016 Document Reviewed: 09/18/2015 Elsevier Interactive Patient Education  2017 [employer] Prevention in the Home Falls can cause injuries. They can happen to people of all ages. There are many things you can do to make your home safe and to help prevent falls. What can I do on the outside of my home? Regularly fix the edges of walkways and driveways and fix any cracks. Remove anything that might make you trip as you walk through a door, such as a raised step or threshold. Trim any bushes or trees on the path to your home. Use bright outdoor lighting. Clear any walking paths of anything that might make someone trip, such as rocks or tools. Regularly check to see if handrails are loose or broken. Make sure that both sides of any steps have handrails. Any raised decks and porches should have guardrails on the edges. Have any leaves, snow, or ice cleared regularly. Use sand or salt on walking paths during winter. Clean up any spills in your garage right away. This includes oil or grease spills. What can I do in the bathroom? Use night lights. Install grab bars by the toilet and in the tub and shower. Do not use towel bars as grab bars. Use non-skid mats or decals in the tub or shower. If you need to sit down in the shower, use a plastic, non-slip stool. Keep the floor dry. Clean up any water that spills on the floor as soon as it happens. Remove soap buildup in the tub or shower regularly. Attach bath mats securely with double-sided non-slip rug tape. Do not have throw rugs and other things on the  floor that can make you trip. What can I do in the bedroom? Use night lights. Make sure that you have a light by your bed that is easy to reach. Do not use any sheets or blankets that are too big for your bed. They should not hang down onto the floor. Have a firm chair that has side arms. You can use this for support while you get dressed. Do not have throw rugs and other things on the floor that can make you trip. What can I do in the kitchen? Clean up any spills right away. Avoid walking on wet floors. Keep items that you use a lot in easy-to-reach places. If you need to reach something above you, use a strong step stool that has a grab bar. Keep electrical cords out of the way. Do not use floor polish or wax that makes floors slippery. If you must use wax, use non-skid floor wax. Do not have throw rugs and other things on the floor that can make you trip. What can I do with my stairs? Do not leave any items on the stairs. Make sure that there are handrails on both sides of the  stairs and use them. Fix handrails that are broken or loose. Make sure that handrails are as long as the stairways. Check any carpeting to make sure that it is firmly attached to the stairs. Fix any carpet that is loose or worn. Avoid having throw rugs at the top or bottom of the stairs. If you do have throw rugs, attach them to the floor with carpet tape. Make sure that you have a light switch at the top of the stairs and the bottom of the stairs. If you do not have them, ask someone to add them for you. What else can I do to help prevent falls? Wear shoes that: Do not have high heels. Have rubber bottoms. Are comfortable and fit you well. Are closed at the toe. Do not wear sandals. If you use a stepladder: Make sure that it is fully opened. Do not climb a closed stepladder. Make sure that both sides of the stepladder are locked into place. Ask someone to hold it for you, if possible. Clearly mark and make  sure that you can see: Any grab bars or handrails. First and last steps. Where the edge of each step is. Use tools that help you move around (mobility aids) if they are needed. These include: Canes. Walkers. Scooters. Crutches. Turn on the lights when you go into a dark area. Replace any light bulbs as soon as they burn out. Set up your furniture so you have a clear path. Avoid moving your furniture around. If any of your floors are uneven, fix them. If there are any pets around you, be aware of where they are. Review your medicines with your doctor. Some medicines can make you feel dizzy. This can increase your chance of falling. Ask your doctor what other things that you can do to help prevent falls. This information is not intended to replace advice given to you by your health care provider. Make sure you discuss any questions you have with your health care provider. Document Released: 09/13/2009 Document Revised: 04/24/2016 Document Reviewed: 12/22/2014 Elsevier Interactive Patient Education  2017 [employer].

## 2022-06-05 NOTE — Progress Notes (Signed)
Subjective:   Haley Randall is a 86 y.o. female who presents for Medicare Annual (Subsequent) preventive examination.  Review of Systems    Virtual Visit via Telephone Note  I connected with  Haley Randall on 06/05/22 at  1:45 PM EDT by telephone and verified that I am speaking with the correct person using two identifiers.  Location: Patient: Home Provider: Office Persons participating in the virtual visit: patient/Nurse Health Advisor   I discussed the limitations, risks, security and privacy concerns of performing an evaluation and management service by telephone and the availability of in person appointments. The patient expressed understanding and agreed to proceed.  Interactive audio and video telecommunications were attempted between this nurse and patient, however failed, due to patient having technical difficulties OR patient did not have access to video capability.  We continued and completed visit with audio only.  Some vital signs may be absent or patient reported.   Tillie Rung, LPN  Cardiac Risk Factors include: advanced age (>91men, >39 women);hypertension     Objective:    Today's Vitals   06/05/22 1344  Weight: 113 lb (51.3 kg)  Height: 4\' 10"  (1.473 m)   Body mass index is 23.62 kg/m.     06/05/2022    1:52 PM 09/19/2019   10:13 AM 08/06/2018    7:50 AM 11/03/2017   10:17 AM  Advanced Directives  Does Patient Have a Medical Advance Directive? Yes No No No  Type of Advance Directive Healthcare Power of Attorney     Does patient want to make changes to medical advance directive? No - Patient declined     Copy of Healthcare Power of Attorney in Chart? No - copy requested     Would patient like information on creating a medical advance directive?    No - Patient declined    Current Medications (verified) Outpatient Encounter Medications as of 06/05/2022  Medication Sig   amLODipine (NORVASC) 10 MG tablet Take 1 tablet (10 mg total) by mouth daily.    lisinopril (ZESTRIL) 10 MG tablet Take 1 tablet (10 mg total) by mouth daily.   pravastatin (PRAVACHOL) 80 MG tablet TAKE 1 TABLET BY MOUTH EVERY DAY   No facility-administered encounter medications on file as of 06/05/2022.    Allergies (verified) Patient has no known allergies.   History: Past Medical History:  Diagnosis Date   Hyperlipidemia    Hypertension    Idiopathic gout of foot 08/30/2020   Past Surgical History:  Procedure Laterality Date   BREAST SURGERY Left 1980   biopsy   Family History  Problem Relation Age of Onset   Heart disease Mother    Heart disease Father    Stroke Maternal Grandmother    Stroke Maternal Grandfather    Heart disease Paternal Grandmother    Heart disease Paternal Grandfather    Cancer Neg Hx    Diabetes Neg Hx    Social History   Socioeconomic History   Marital status: Married    Spouse name: 09/01/2020   Number of children: 1   Years of education: Not on file   Highest education level: Not on file  Occupational History   Not on file  Tobacco Use   Smoking status: Former   Smokeless tobacco: Never   Tobacco comments:    quit 30 years ago  Vaping Use   Vaping Use: Never used  Substance and Sexual Activity   Alcohol use: Not Currently   Drug use: No   Sexual  activity: Not Currently  Other Topics Concern   Not on file  Social History Narrative   05/23/22   From: originally from [place], then [place]    Living: with husband Education officer, environmental) and grandson   Work: retired - book keeping      Family: one adopted daughter - Annabelle Harman      Enjoys: nothing currently      Exercise: not currently   Diet: avoids certain foods - seafood, red meat      Safety   Seat belts: Yes    Guns: No   Safe in relationships: Yes       Social Determinants of Corporate investment banker Strain: Low Risk  (06/05/2022)   Overall Financial Resource Strain (CARDIA)    Difficulty of Paying Living Expenses: Not hard at all  Food Insecurity: No Food Insecurity  (06/05/2022)   Hunger Vital Sign    Worried About Running Out of Food in the Last Year: Never true    Ran Out of Food in the Last Year: Never true  Transportation Needs: No Transportation Needs (06/05/2022)   PRAPARE - Administrator, Civil Service (Medical): No    Lack of Transportation (Non-Medical): No  Physical Activity: Inactive (06/05/2022)   Exercise Vital Sign    Days of Exercise per Week: 0 days    Minutes of Exercise per Session: 0 min  Stress: No Stress Concern Present (06/05/2022)   [employer] of Occupational Health - Occupational Stress Questionnaire    Feeling of Stress : Not at all  Social Connections: Socially Integrated (06/05/2022)   Social Connection and Isolation Panel [NHANES]    Frequency of Communication with Friends and Family: More than three times a week    Frequency of Social Gatherings with Friends and Family: More than three times a week    Attends Religious Services: 1 to 4 times per year    Active Member of [employer] or Organizations: Yes    Attends Banker Meetings: 1 to 4 times per year    Marital Status: Married    Tobacco Counseling Counseling given: Not Answered Tobacco comments: quit 30 years ago   Clinical Intake:   Diabetic?  No  Activities of Daily Living    06/05/2022    1:51 PM 11/19/2021    9:36 AM  In your present state of health, do you have any difficulty performing the following activities:  Hearing? 0 0  Vision? 0 0  Difficulty concentrating or making decisions? 0 0  Walking or climbing stairs? 0 0  Dressing or bathing? 0 0  Doing errands, shopping? 0 0  Preparing Food and eating ? N   Using the Toilet? N   In the past six months, have you accidently leaked urine? N   Do you have problems with loss of bowel control? N   Managing your Medications? N   Managing your Finances? N   Housekeeping or managing your Housekeeping? N     Patient Care Team: Lynnda Child, [place] as PCP - General (Family  Medicine)  Indicate any recent Medical Services you may have received from other than [facility] providers in the past year (date may be approximate).     Assessment:   This is a routine wellness examination for Haley Randall.  Hearing/Vision screen Hearing Screening - Comments:: No hearing difficulty Vision Screening - Comments:: Wears glasses.   Dietary issues and exercise activities discussed: Exercise limited by: None identified   Goals Addressed  This Visit's Progress     No current goals (pt-stated)         Depression Screen    06/05/2022    1:48 PM 11/19/2021    9:37 AM 08/30/2020   10:54 AM 08/30/2019   12:09 PM 08/24/2018    8:16 AM 08/20/2017    8:14 AM 08/18/2016    8:34 AM  PHQ 2/9 Scores  PHQ - 2 Score 0 0 0 0 0 0 0    Fall Risk    06/05/2022    1:51 PM 11/19/2021    9:37 AM 08/30/2020   10:54 AM 10/26/2019   10:46 AM 08/24/2018    8:16 AM  Fall Risk   Falls in the past year? 0 0 0 0 No  Comment    Emmi Telephone Survey: data to providers prior to load   Number falls in past yr: 0      Injury with Fall? 0      Risk for fall due to : No Fall Risks        FALL RISK PREVENTION PERTAINING TO THE HOME:  Any stairs in or around the home? No  If so, are there any without handrails? No  Home free of loose throw rugs in walkways, pet beds, electrical cords, etc? Yes  Adequate lighting in your home to reduce risk of falls? Yes   ASSISTIVE DEVICES UTILIZED TO PREVENT FALLS:  Life alert? No  Use of a cane, walker or w/c? No  Grab bars in the bathroom? Yes  Shower chair or bench in shower? Yes  Elevated toilet seat or a handicapped toilet? No   TIMED UP AND GO:  Was the test performed? No . Audio Visit  Cognitive Function:        06/05/2022    1:52 PM  6CIT Screen  What Year? 0 points  What month? 0 points  What time? 0 points  Count back from 20 0 points  Months in reverse 0 points  Repeat phrase 0 points  Total Score 0 points     Immunizations Immunization History  Administered Date(s) Administered   Fluad Quad(high Dose 65+) 11/19/2021   Influenza,inj,Quad PF,6+ Mos 08/30/2019   Moderna Sars-Covid-2 Vaccination 12/27/2019, 01/24/2020   Pneumococcal Polysaccharide-23 11/19/2021   Zoster Recombinat (Shingrix) 02/01/2021      Flu Vaccine status: Up to date  Pneumococcal vaccine status: Up to date  Covid-19 vaccine status: Completed vaccines  Qualifies for Shingles Vaccine? Yes   Zostavax completed No   Shingrix Completed?: No.    Education has been provided regarding the importance of this vaccine. Patient has been advised to call insurance company to determine out of pocket expense if they have not yet received this vaccine. Advised may also receive vaccine at local pharmacy or Health Dept. Verbalized acceptance and understanding.  Screening Tests Health Maintenance  Topic Date Due   COVID-19 Vaccine (3 - Moderna series) 03/20/2020   Zoster Vaccines- Shingrix (2 of 2) 09/05/2022 (Originally 03/29/2021)   DEXA SCAN  06/06/2023 (Originally 08/07/2003)   TETANUS/TDAP  06/06/2023 (Originally 08/06/1955)   INFLUENZA VACCINE  07/01/2022   Pneumonia Vaccine 12+ Years old (2 - PCV) 11/19/2022   HPV VACCINES  Aged Out    Health Maintenance  Health Maintenance Due  Topic Date Due   COVID-19 Vaccine (3 - Moderna series) 03/20/2020    Colorectal cancer screening: No longer required.   Mammogram status: No longer required due to Age.  Bone Density status: Ordered Patient deferred.  Pt provided with contact info and advised to call to schedule appt.  Lung Cancer Screening: (Low Dose CT Chest recommended if Age 4-80 years, 30 pack-year currently smoking OR have quit w/in 15years.) does not qualify.     Additional Screening:  Hepatitis C Screening: does not qualify; Completed   Vision Screening: Recommended annual ophthalmology exams for early detection of glaucoma and other disorders of the eye. Is  the patient up to date with their annual eye exam?  No  Who is the provider or what is the name of the office in which the patient attends annual eye exams? Patient deferred If pt is not established with a provider, would they like to be referred to a provider to establish care? No .   Dental Screening: Recommended annual dental exams for proper oral hygiene  Community Resource Referral / Chronic Care Management:  CRR required this visit?  No   CCM required this visit?  No      Plan:     I have personally reviewed and noted the following in the patient's chart:   Medical and social history Use of alcohol, tobacco or illicit drugs  Current medications and supplements including opioid prescriptions.  Functional ability and status Nutritional status Physical activity Advanced directives List of other physicians Hospitalizations, surgeries, and ER visits in previous 12 months Vitals Screenings to include cognitive, depression, and falls Referrals and appointments  In addition, I have reviewed and discussed with patient certain preventive protocols, quality metrics, and best practice recommendations. A written personalized care plan for preventive services as well as general preventive health recommendations were provided to patient.     Tillie Rung, LPN   05/05/7845   Nurse Notes: None

## 2022-06-12 ENCOUNTER — Other Ambulatory Visit: Payer: Medicare Other

## 2022-06-17 ENCOUNTER — Telehealth: Payer: Self-pay | Admitting: *Deleted

## 2022-06-17 MED ORDER — HYDROCHLOROTHIAZIDE 12.5 MG PO TABS: 12.5000 mg | ORAL_TABLET | Freq: Every day | ORAL | 0 refills | Status: DC

## 2022-06-17 NOTE — Telephone Encounter (Signed)
Ok to cancel lab appointment.   Routing to [place] to send in HCTZ 12.5 mg for patient to start as BP is high.

## 2022-06-17 NOTE — Telephone Encounter (Signed)
Called patient and was advised that she checked her blood pressure twice and it was 161/64 pulse 81 and 164/64. Patient stated that she is feeling fine now and no symptoms. Patient stated at times she feels like her heart may be racing and at that time she gets a little lightheaded. Patient state that this has been going on for about two weeks but it comes and goes. Patient stated that she is not having any symptoms now. Patient was given ER precautions and she verbalized understanding. Patient wants to know if she should go ahead and come for her lab appointment tomorrow 06/18/22 or have it done when she comes in to see Dr. Selena Batten in two weeks. Patient was advised that I will reach out to Dr. Selena Batten and see what she recommends.  Patient was advised that Dr. Selena Batten may want to see her sooner and she will get a call back.

## 2022-06-17 NOTE — Telephone Encounter (Addendum)
Called patient to cancel her nurse visit appointment tomorrow for blood pressure check and reschedule her for office visit with Dr. Selena Batten in two weeks. Okay per Dr. Selena Batten Patient stated that she has not taken Lisinopril in about a week because her nose starting bleeding after starting the medication. Patient stated that her nose stopped bleeding after stopping the Lisinopril. Patient stated that she has a machine to check her blood pressure but has not checked it. Patient was advised to check her blood pressure and I will call her back to see how it is running.

## 2022-06-17 NOTE — Telephone Encounter (Signed)
Spoke to pt and cancelled lab appt for 7/19. Scheduled BP f/u appt for Aug. 2, per Dr. Selena Batten. #30 HCTZ sent in to [facility].  Pt states understanding.

## 2022-06-18 ENCOUNTER — Other Ambulatory Visit: Payer: Medicare Other

## 2022-06-18 ENCOUNTER — Ambulatory Visit: Payer: Medicare Other

## 2022-06-24 ENCOUNTER — Ambulatory Visit (INDEPENDENT_AMBULATORY_CARE_PROVIDER_SITE_OTHER): Payer: Medicare Other | Admitting: Family Medicine

## 2022-06-24 VITALS — BP 144/60 | HR 98 | Temp 97.2°F | Ht <= 58 in | Wt 107.4 lb

## 2022-06-24 DIAGNOSIS — R9431 Abnormal electrocardiogram [ECG] [EKG]: Secondary | ICD-10-CM | POA: Diagnosis not present

## 2022-06-24 DIAGNOSIS — R079 Chest pain, unspecified: Secondary | ICD-10-CM

## 2022-06-24 DIAGNOSIS — R42 Dizziness and giddiness: Secondary | ICD-10-CM | POA: Diagnosis not present

## 2022-06-24 DIAGNOSIS — I10 Essential (primary) hypertension: Secondary | ICD-10-CM

## 2022-06-24 DIAGNOSIS — E7800 Pure hypercholesterolemia, unspecified: Secondary | ICD-10-CM | POA: Diagnosis not present

## 2022-06-24 DIAGNOSIS — I951 Orthostatic hypotension: Secondary | ICD-10-CM | POA: Diagnosis not present

## 2022-06-24 LAB — CBC WITH DIFFERENTIAL/PLATELET
Basophils Absolute: 0 10*3/uL (ref 0.0–0.1)
Basophils Relative: 1 % (ref 0.0–3.0)
Eosinophils Absolute: 0.1 10*3/uL (ref 0.0–0.7)
Eosinophils Relative: 2.3 % (ref 0.0–5.0)
HCT: 41.1 % (ref 36.0–46.0)
Hemoglobin: 13.2 g/dL (ref 12.0–15.0)
Lymphocytes Relative: 29.2 % (ref 12.0–46.0)
Lymphs Abs: 1.2 10*3/uL (ref 0.7–4.0)
MCHC: 32.2 g/dL (ref 30.0–36.0)
MCV: 85 fl (ref 78.0–100.0)
Monocytes Absolute: 0.3 10*3/uL (ref 0.1–1.0)
Monocytes Relative: 7.9 % (ref 3.0–12.0)
Neutro Abs: 2.5 10*3/uL (ref 1.4–7.7)
Neutrophils Relative %: 59.6 % (ref 43.0–77.0)
Platelets: 250 10*3/uL (ref 150.0–400.0)
RBC: 4.83 Mil/uL (ref 3.87–5.11)
RDW: 14 % (ref 11.5–15.5)
WBC: 4.2 10*3/uL (ref 4.0–10.5)

## 2022-06-24 LAB — COMPREHENSIVE METABOLIC PANEL
ALT: 12 U/L (ref 0–35)
AST: 24 U/L (ref 0–37)
Albumin: 4.7 g/dL (ref 3.5–5.2)
Alkaline Phosphatase: 72 U/L (ref 39–117)
BUN: 22 mg/dL (ref 6–23)
CO2: 27 mEq/L (ref 19–32)
Calcium: 11 mg/dL — ABNORMAL HIGH (ref 8.4–10.5)
Chloride: 98 mEq/L (ref 96–112)
Creatinine, Ser: 1.49 mg/dL — ABNORMAL HIGH (ref 0.40–1.20)
GFR: 32.2 mL/min — ABNORMAL LOW (ref >60.00)
Glucose, Bld: 107 mg/dL — ABNORMAL HIGH (ref 70–99)
Potassium: 4.2 mEq/L (ref 3.5–5.1)
Sodium: 139 mEq/L (ref 135–145)
Total Bilirubin: 1.3 mg/dL — ABNORMAL HIGH (ref 0.2–1.2)
Total Protein: 8 g/dL (ref 6.0–8.3)

## 2022-06-24 LAB — LIPID PANEL
Cholesterol: 204 mg/dL — ABNORMAL HIGH (ref 0–200)
HDL: 93.8 mg/dL (ref >39.00)
LDL Cholesterol: 94 mg/dL (ref 0–99)
NonHDL: 110.21
Total CHOL/HDL Ratio: 2
Triglycerides: 81 mg/dL (ref 0.0–149.0)
VLDL: 16.2 mg/dL (ref 0.0–40.0)

## 2022-06-24 LAB — TSH: TSH: 3.42 u[IU]/mL (ref 0.35–5.50)

## 2022-06-24 NOTE — Assessment & Plan Note (Signed)
She has some nonspecific ST depression on her EKG.  Patient notes chest pain with stress which goes away with rest, given high blood pressure this is concerning for possible cardiac source.  Referral to cardiology for additional work-up and evaluation.

## 2022-06-24 NOTE — Assessment & Plan Note (Signed)
Blood pressure elevated, however low with standing.  Continue amlodipine 10 mg and hydrochlorothiazide 12.5 mg

## 2022-06-24 NOTE — Assessment & Plan Note (Signed)
Orthostatic vitals are consistent with hypotension and tachycardia.  Patient notes only drinking approximately 1 glass of water per day, advised increasing to at least 32 ounces of water per day.  Slow transitions with getting out of bed or standing up.  Labs today to assess for possible causes.

## 2022-06-24 NOTE — Assessment & Plan Note (Signed)
Lab Results  Component Value Date   LDLCALC 95 11/19/2021   On pravastatin 80 mg, recheck today.

## 2022-06-24 NOTE — Patient Instructions (Addendum)
Dizziness - You are not getting enough water - Drink at least 32 ounces of water daily - Slow transitions - sit on the side the bed before standing - if still getting symptoms - try criss-crossing legs before moving to sitting position   If worsening dizziness or chest pain  #Referral I have placed a referral to a specialist for you. You should receive a phone call from the specialty office. Make sure your voicemail is not full and that if you are able to answer your phone to unknown or new numbers.   It may take up to 2 weeks to hear about the referral. If you do not hear anything in 2 weeks, please call our office and ask to speak with the referral coordinator.  - cardiology

## 2022-06-24 NOTE — Progress Notes (Signed)
Subjective:     Haley Randall is a 86 y.o. female presenting for Dizziness (On and off, but it's gotten worse over last 24 hours. )     HPI  #dizziness - x2 weeks - this morning was more severe - will get occasional pain in the chest when she gets upset and her head will get lightheaded and it will go away - this morning - got out of bed and had to get back in b/c she was feeling lightheaded - went to get up to go to the bathroom and felt woozy - started to fall as she was trying to get back into her bed   Was having dizziness and sitting down it would go away  Will occasionally get dizziness throughout the day will get some symptoms   Review of Systems   Social History   Tobacco Use  Smoking Status Former  Smokeless Tobacco Never  Tobacco Comments   quit 30 years ago        Objective:    BP Readings from Last 3 Encounters:  06/24/22 (!) 144/60  05/23/22 (!) 152/50  11/19/21 (!) 146/60   Wt Readings from Last 3 Encounters:  06/24/22 107 lb 6 oz (48.7 kg)  06/05/22 113 lb (51.3 kg)  05/23/22 113 lb (51.3 kg)    BP (!) 144/60   Pulse 98   Temp (!) 97.2 F (36.2 C) (Temporal)   Ht 4\' 10"  (1.473 m)   Wt 107 lb 6 oz (48.7 kg)   SpO2 100%   BMI 22.44 kg/m    Physical Exam Constitutional:      General: She is not in acute distress.    Appearance: She is well-developed. She is not diaphoretic.  HENT:     Right Ear: External ear normal.     Left Ear: External ear normal.     Nose: Nose normal.  Eyes:     Conjunctiva/sclera: Conjunctivae normal.  Cardiovascular:     Rate and Rhythm: Normal rate and regular rhythm.     Heart sounds: Murmur heard.  Pulmonary:     Effort: Pulmonary effort is normal. No respiratory distress.     Breath sounds: Normal breath sounds. No wheezing.  Musculoskeletal:     Cervical back: Neck supple.  Skin:    General: Skin is warm and dry.     Capillary Refill: Capillary refill takes less than 2 seconds.  Neurological:      Mental Status: She is alert. Mental status is at baseline.  Psychiatric:        Mood and Affect: Mood normal.        Behavior: Behavior normal.     EKG: NSR, ST depression      Assessment & Plan:   Problem List Items Addressed This Visit       Cardiovascular and Mediastinum   Essential hypertension    Blood pressure elevated, however low with standing.  Continue amlodipine 10 mg and hydrochlorothiazide 12.5 mg      Relevant Orders   Comprehensive metabolic panel   TSH   Orthostatic hypotension - Primary    Orthostatic vitals are consistent with hypotension and tachycardia.  Patient notes only drinking approximately 1 glass of water per day, advised increasing to at least 32 ounces of water per day.  Slow transitions with getting out of bed or standing up.  Labs today to assess for possible causes.      Relevant Orders   EKG 12-Lead (Completed)   CBC  with Differential   TSH   Ambulatory referral to Cardiology     Other   HLD (hyperlipidemia)    Lab Results  Component Value Date   LDLCALC 95 11/19/2021  On pravastatin 80 mg, recheck today.      Relevant Orders   Lipid panel   TSH   Abnormal EKG    She has some nonspecific ST depression on her EKG.  Patient notes chest pain with stress which goes away with rest, given high blood pressure this is concerning for possible cardiac source.  Referral to cardiology for additional work-up and evaluation.      Relevant Orders   TSH   Ambulatory referral to Cardiology   Other Visit Diagnoses     Chest pain, unspecified type       Relevant Orders   EKG 12-Lead (Completed)   TSH   Ambulatory referral to Cardiology        Return in about 4 weeks (around 07/22/2022) for dizziness.  Lynnda Child, MD

## 2022-07-02 ENCOUNTER — Ambulatory Visit: Payer: Medicare Other | Admitting: Family Medicine

## 2022-07-10 ENCOUNTER — Other Ambulatory Visit: Payer: Self-pay | Admitting: Family Medicine

## 2022-07-26 ENCOUNTER — Other Ambulatory Visit: Payer: Self-pay | Admitting: Family Medicine

## 2022-08-01 ENCOUNTER — Encounter: Payer: Self-pay | Admitting: Interventional Cardiology

## 2022-08-01 ENCOUNTER — Ambulatory Visit: Payer: Medicare Other | Attending: Interventional Cardiology | Admitting: Interventional Cardiology

## 2022-08-01 VITALS — BP 148/54 | HR 76 | Ht <= 58 in | Wt 109.2 lb

## 2022-08-01 DIAGNOSIS — I951 Orthostatic hypotension: Secondary | ICD-10-CM | POA: Diagnosis not present

## 2022-08-01 DIAGNOSIS — I10 Essential (primary) hypertension: Secondary | ICD-10-CM | POA: Diagnosis not present

## 2022-08-01 DIAGNOSIS — R072 Precordial pain: Secondary | ICD-10-CM | POA: Diagnosis not present

## 2022-08-01 DIAGNOSIS — I779 Disorder of arteries and arterioles, unspecified: Secondary | ICD-10-CM

## 2022-08-01 DIAGNOSIS — R9431 Abnormal electrocardiogram [ECG] [EKG]: Secondary | ICD-10-CM | POA: Diagnosis not present

## 2022-08-01 DIAGNOSIS — E782 Mixed hyperlipidemia: Secondary | ICD-10-CM | POA: Diagnosis not present

## 2022-08-01 MED ORDER — CARVEDILOL 3.125 MG PO TABS: 3.1250 mg | ORAL_TABLET | Freq: Two times a day (BID) | ORAL | 3 refills | Status: DC

## 2022-08-01 NOTE — Patient Instructions (Addendum)
Medication Instructions:  Your physician has recommended you make the following change in your medication: Start Carvedilol 3.125 mg by mouth twice daily Stop hydrochlorothiazide  *If you need a refill on your cardiac medications before your next appointment, please call your pharmacy*   Lab Work: none If you have labs (blood work) drawn today and your tests are completely normal, you will receive your results only by: MyChart Message (if you have MyChart) OR A paper copy in the mail If you have any lab test that is abnormal or we need to change your treatment, we will call you to review the results.   Testing/Procedures: Your physician has requested that you have a carotid duplex. This test is an ultrasound of the carotid arteries in your neck. It looks at blood flow through these arteries that supply the brain with blood. Allow one hour for this exam. There are no restrictions or special instructions.  Your physician has requested that you have an echocardiogram. Echocardiography is a painless test that uses sound waves to create images of your heart. It provides your doctor with information about the size and shape of your heart and how well your heart's chambers and valves are working. This procedure takes approximately one hour. There are no restrictions for this procedure.    Follow-Up: At [facility], you and your health needs are our priority.  As part of our continuing mission to provide you with exceptional heart care, we have created designated Provider Care Teams.  These Care Teams include your primary Cardiologist (physician) and Advanced Practice Providers (APPs -  Physician Assistants and Nurse Practitioners) who all work together to provide you with the care you need, when you need it.  We recommend signing up for the patient portal called "MyChart".  Sign up information is provided on this After Visit Summary.  MyChart is used to connect with patients for Virtual  Visits (Telemedicine).  Patients are able to view lab/test results, encounter notes, upcoming appointments, etc.  Non-urgent messages can be sent to your provider as well.   To learn more about what you can do with MyChart, go to [email or web address].    Your next appointment:   12 month(s)  The format for your next appointment:   In Person  Provider:   Lance Muss, MD     Other Instructions You have been referred to hypertension clinic in our office.  Please schedule  new patient appointment with pharmacist in about 4 weeks.   Dr Eldridge Dace recommends you stay well hydrated.   Important Information About Sugar

## 2022-08-01 NOTE — Progress Notes (Signed)
Cardiology Office Note   Date:  08/01/2022   ID:  Haley Randall, DOB 1936/05/17, MRN [id number]  PCP:  Lynnda Child, MD    No chief complaint on file.  Orthostatic hypotension  Wt Readings from Last 3 Encounters:  08/01/22 109 lb 3.2 oz (49.5 kg)  06/24/22 107 lb 6 oz (48.7 kg)  06/05/22 113 lb (51.3 kg)       History of Present Illness: Haley Randall is a 86 y.o. female who is being seen today for the evaluation of orthostatic hypotension at the request of Lynnda Child, MD.   Records from primary care doctor show: "Orthostatic vitals are consistent with hypotension and tachycardia.  Patient notes only drinking approximately 1 glass of water per day, advised increasing to at least 32 ounces of water per day.  Slow transitions with getting out of bed or standing up. .. Blood pressure elevated, however low with standing.  Continue amlodipine 10 mg and hydrochlorothiazide 12.5 mg"  She has stopped the HCTZ and felt better.    She reported chest discomfort with emotional stress.  Not related to exertion.  Resolves with relaxation. 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  He is difficult to care for.  He does not listen to her and she gets upset.  He fell yesterday and broke some ribs.    Her Walking is limited by leg pains. She does housework.  No dedicated walking for exercise.     Past Medical History:  Diagnosis Date   Chest pain    Dizziness    Hyperlipidemia    Hypertension    Idiopathic gout of foot 08/30/2020    Past Surgical History:  Procedure Laterality Date   BREAST SURGERY Left 1980   biopsy     Current Outpatient Medications  Medication Sig Dispense Refill   amLODipine (NORVASC) 10 MG tablet Take 1 tablet (10 mg total) by mouth daily. 90 tablet 1   pravastatin (PRAVACHOL) 80 MG tablet TAKE 1 TABLET BY MOUTH EVERY DAY 90 tablet 0   hydrochlorothiazide (HYDRODIURIL) 12.5 MG tablet TAKE 1 TABLET BY MOUTH EVERY DAY (Patient  not taking: Reported on 08/01/2022) 30 tablet 0   No current facility-administered medications for this visit.    Allergies:   Lisinopril    Social History:  The patient  reports that she has quit smoking. She has never used smokeless tobacco. She reports that she does not currently use alcohol. She reports that she does not use drugs.   Family History:  The patient's family history includes Heart disease in her father, mother, paternal grandfather, and paternal grandmother; Stroke in her maternal grandfather and maternal grandmother.    ROS:  Please see the history of present illness.   Otherwise, review of systems are positive for dizziness with standing.   All other systems are reviewed and negative.    PHYSICAL EXAM: VS:  BP (!) 148/54   Pulse 76   Ht 4\' 10"  (1.473 m)   Wt 109 lb 3.2 oz (49.5 kg)   SpO2 99%   BMI 22.82 kg/m  , BMI Body mass index is 22.82 kg/m. GEN: Frail, in no acute distress HEENT: normal Neck: no JVD, bilateral carotid bruits L>R, or masses Cardiac: RRR, premature beats; no murmurs, rubs, or gallops,no edema  Respiratory:  clear to auscultation bilaterally, normal work of breathing GI: soft, nontender, nondistended, + BS MS: no deformity or atrophy Skin: warm and dry, no rash Neuro:  Strength and sensation are intact Psych: euthymic mood, full affect   EKG:   The ekg ordered today demonstrates normal sinus rhythm with nonspecific ST-T wave changes   Recent Labs: 06/24/2022: ALT 12; BUN 22; Creatinine, Ser 1.49; Hemoglobin 13.2; Platelets 250.0; Potassium 4.2; Sodium 139; TSH 3.42   Lipid Panel    Component Value Date/Time   CHOL 204 (H) 06/24/2022 1010   TRIG 81.0 06/24/2022 1010   HDL 93.80 06/24/2022 1010   CHOLHDL 2 06/24/2022 1010   VLDL 16.2 06/24/2022 1010   LDLCALC 94 06/24/2022 1010     Other studies Reviewed: Additional studies/ records that were reviewed today with results demonstrating: labs reviewed, Cr 1.49, LDL  94.   ASSESSMENT AND PLAN:  Abnormal ECG: Check echocardiogram. HTN: Had orthostatic readings with primary care doctor.  She was encouraged to hydrate better at that time.  She was maintained on her medications.  If hydration continues to be an issue, could stop HCTZ.  She does not appear grossly volume overloaded.  She ultimately stopped this on her own and she felt better.  Continue to hold the HCTZ.  Dizziness has improved.  Blood pressure is somewhat elevated today, lying down in the 160s and standing in the high 130s systolic.  We will add carvedilol 3.125 mg twice daily.  Hopefully this will help with blood pressure as well as the chest discomfort. Hyperlipidemia: continue pravastatin. LDL 94.  Given carotid disease, could argue for higher potency statin with LDL target of 70. 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  He is difficult to care for.  He does not listen to her and she gets upset.  He fell yesterday and broke some ribs.  Not triggered by physical exertion.  Would hold off on ischemia evaluation at this time.  She may have disease but she is elderly and frail with renal insufficiency.  Medical management in the absence of exertional symptoms would be the best course. CRI: Avoid nephrotoxins.  Would try to avoid procedures that would involve contrast.   Current medicines are reviewed at length with the patient today.  The patient concerns regarding her medicines were addressed.  The following changes have been made:  add Coreg 3.125 mg BID  Labs/ tests ordered today include:  No orders of the defined types were placed in this encounter.   Recommend 150 minutes/week of aerobic exercise Low fat, low carb, high fiber diet recommended  Disposition:   FU in 3-4 weeks, Pharm D HTN clinic   Signed, Lance Muss, MD  08/01/2022 10:04 AM    [facility] [address] [place], [place], [place]   [address] Phone: [phone]; Fax: [phone]

## 2022-08-04 ENCOUNTER — Other Ambulatory Visit: Payer: Self-pay

## 2022-08-04 ENCOUNTER — Emergency Department
Admission: EM | Admit: 2022-08-04 | Discharge: 2022-08-04 | Disposition: A | Discharge status: Home / Self Care | Payer: Medicare Other | Attending: Emergency Medicine | Admitting: Emergency Medicine

## 2022-08-04 DIAGNOSIS — M542 Cervicalgia: Secondary | ICD-10-CM | POA: Diagnosis not present

## 2022-08-04 DIAGNOSIS — I129 Hypertensive chronic kidney disease with stage 1 through stage 4 chronic kidney disease, or unspecified chronic kidney disease: Secondary | ICD-10-CM | POA: Diagnosis not present

## 2022-08-04 DIAGNOSIS — N183 Chronic kidney disease, stage 3 unspecified: Secondary | ICD-10-CM | POA: Insufficient documentation

## 2022-08-04 DIAGNOSIS — R001 Bradycardia, unspecified: Secondary | ICD-10-CM | POA: Diagnosis not present

## 2022-08-04 LAB — CBC WITH DIFFERENTIAL/PLATELET
Abs Immature Granulocytes: 0.01 10*3/uL (ref 0.00–0.07)
Basophils Absolute: 0 10*3/uL (ref 0.0–0.1)
Basophils Relative: 1 %
Eosinophils Absolute: 0.1 10*3/uL (ref 0.0–0.5)
Eosinophils Relative: 2 %
HCT: 38 % (ref 36.0–46.0)
Hemoglobin: 11.9 g/dL — ABNORMAL LOW (ref 12.0–15.0)
Immature Granulocytes: 0 %
Lymphocytes Relative: 29 %
Lymphs Abs: 1.4 10*3/uL (ref 0.7–4.0)
MCH: 27.2 pg (ref 26.0–34.0)
MCHC: 31.3 g/dL (ref 30.0–36.0)
MCV: 86.8 fL (ref 80.0–100.0)
Monocytes Absolute: 0.5 10*3/uL (ref 0.1–1.0)
Monocytes Relative: 10 %
Neutro Abs: 2.7 10*3/uL (ref 1.7–7.7)
Neutrophils Relative %: 58 %
Platelets: 216 10*3/uL (ref 150–400)
RBC: 4.38 MIL/uL (ref 3.87–5.11)
RDW: 13 % (ref 11.5–15.5)
WBC: 4.7 10*3/uL (ref 4.0–10.5)
nRBC: 0 % (ref 0.0–0.2)

## 2022-08-04 LAB — COMPREHENSIVE METABOLIC PANEL
ALT: 16 U/L (ref 0–44)
AST: 31 U/L (ref 15–41)
Albumin: 4.1 g/dL (ref 3.5–5.0)
Alkaline Phosphatase: 60 U/L (ref 38–126)
Anion gap: 7 (ref 5–15)
BUN: 14 mg/dL (ref 8–23)
CO2: 25 mmol/L (ref 22–32)
Calcium: 9.8 mg/dL (ref 8.9–10.3)
Chloride: 106 mmol/L (ref 98–111)
Creatinine, Ser: 1.26 mg/dL — ABNORMAL HIGH (ref 0.44–1.00)
GFR, Estimated: 42 mL/min — ABNORMAL LOW (ref >60)
Glucose, Bld: 103 mg/dL — ABNORMAL HIGH (ref 70–99)
Potassium: 3.8 mmol/L (ref 3.5–5.1)
Sodium: 138 mmol/L (ref 135–145)
Total Bilirubin: 1.6 mg/dL — ABNORMAL HIGH (ref 0.3–1.2)
Total Protein: 7.7 g/dL (ref 6.5–8.1)

## 2022-08-04 LAB — TROPONIN I (HIGH SENSITIVITY): Troponin I (High Sensitivity): 8 ng/L (ref <18)

## 2022-08-04 MED ORDER — LIDOCAINE 5 % EX PTCH
1.0000 | MEDICATED_PATCH | CUTANEOUS | Status: DC
  Administered 2022-08-04: 1 via TRANSDERMAL
  Filled 2022-08-04: qty 1

## 2022-08-04 MED ORDER — LIDOCAINE 5 % EX PTCH: 1.0000 | MEDICATED_PATCH | CUTANEOUS | 0 refills | Status: DC

## 2022-08-04 MED ORDER — IBUPROFEN 400 MG PO TABS
400.0000 mg | ORAL_TABLET | Freq: Once | ORAL | Status: AC
  Administered 2022-08-04: 400 mg via ORAL
  Filled 2022-08-04: qty 1

## 2022-08-04 MED ORDER — ACETAMINOPHEN 500 MG PO TABS
1000.0000 mg | ORAL_TABLET | Freq: Once | ORAL | Status: AC
  Administered 2022-08-04: 1000 mg via ORAL
  Filled 2022-08-04: qty 2

## 2022-08-04 NOTE — Discharge Instructions (Signed)
Please take Tylenol and you can use the Lidoderm patch for her neck pain.  You can use several days of ibuprofen 400 mg every 6 hours.  If you are having any numbness or weakness please return to the emergency department.  Otherwise please follow-up with your primary care provider.

## 2022-08-04 NOTE — ED Provider Notes (Signed)
[facility] Provider Note    Event Date/Time   First MD Initiated Contact with Patient 08/04/22 [phone]     (approximate)   History   Neck Pain   HPI  Haley Randall is a 86 y.o. female past medical history of hypertension hyperlipidemia presents with neck pain.  Started yesterday.  Pain is located on the right posterior neck now radiating to the right shoulder.  It is worse with movement.  She denies any history of similar denies any injury.  She denies any pain down the arm no numbness tingling or weakness denies visual change difficulty speaking.  Has not tried anything for the pain.  He denies any chest pain back pain difficulty breathing.    Past Medical History:  Diagnosis Date   Chest pain    Dizziness    Hyperlipidemia    Hypertension    Idiopathic gout of foot 08/30/2020    Patient Active Problem List   Diagnosis Date Noted   Abnormal EKG 06/24/2022   Orthostatic hypotension 06/24/2022   Hyperlipidemia with target LDL less than 130 11/19/2021   Primary hypertension 11/19/2021   Seborrheic dermatitis of scalp 11/19/2021   Stage 3a chronic kidney disease (HCC) 03/04/2021   Carotid atherosclerosis, bilateral 08/24/2018   Essential hypertension 11/08/2015   HLD (hyperlipidemia) 11/08/2015     Physical Exam  Triage Vital Signs: ED Triage Vitals [08/04/22 0624]  Enc Vitals Group     BP (!) 151/66     Pulse Rate 78     Resp 19     Temp 98 F (36.7 C)     Temp Source Oral     SpO2 98 %     Weight 110 lb (49.9 kg)     Height 4\' 10"  (1.473 m)     Head Circumference      Peak Flow      Pain Score 10     Pain Loc      Pain Edu?      Excl. in GC?     Most recent vital signs: Vitals:   08/04/22 0624  BP: (!) 151/66  Pulse: 78  Resp: 19  Temp: 98 F (36.7 C)  SpO2: 98%     General: Awake, no distress.  CV:  Good peripheral perfusion.  Resp:  Normal effort.  Abd:  No distention.  Neuro:             Awake, Alert, Oriented x 3   Other:  Aox3, nml speech  PERRL, EOMI, face symmetric, nml tongue movement  5/5 strength in the BL upper and lower extremities  Sensation grossly intact in the BL upper and lower extremities  Finger-nose-finger intact BL  No midline C-spine tenderness, mild tenderness along the cervical paraspinal muscles into the trap 5/5 strength with elbow flexion extension wrist extension grip and finger abduction bilateral upper extremities   ED Results / Procedures / Treatments  Labs (all labs ordered are listed, but only abnormal results are displayed) Labs Reviewed  CBC WITH DIFFERENTIAL/PLATELET - Abnormal; Notable for the following components:      Result Value   Hemoglobin 11.9 (*)    All other components within normal limits  COMPREHENSIVE METABOLIC PANEL - Abnormal; Notable for the following components:   Glucose, Bld 103 (*)    Creatinine, Ser 1.26 (*)    Total Bilirubin 1.6 (*)    GFR, Estimated 42 (*)    All other components within normal limits  TROPONIN I (HIGH  SENSITIVITY)     EKG EKG shows sinus rhythm with PACs normal axis nonspecific T wave flattening throughout no prior EKG to compare to    RADIOLOGY    PROCEDURES:  Critical Care performed: No  .1-3 Lead EKG Interpretation  Performed by: Georga Hacking, MD Authorized by: Georga Hacking, MD     Interpretation: abnormal     ECG rate assessment: bradycardic     Rhythm: sinus bradycardia     Ectopy: none     Conduction: normal     The patient is on the cardiac monitor to evaluate for evidence of arrhythmia and/or significant heart rate changes.   MEDICATIONS ORDERED IN ED: Medications  lidocaine (LIDODERM) 5 % 1 patch (1 patch Transdermal Patch Applied 08/04/22 0814)  acetaminophen (TYLENOL) tablet 1,000 mg (1,000 mg Oral Given 08/04/22 0812)  ibuprofen (ADVIL) tablet 400 mg (400 mg Oral Given 08/04/22 1610)     IMPRESSION / MDM / ASSESSMENT AND PLAN / ED COURSE  I reviewed the triage vital signs  and the nursing notes.                              Patient's presentation is most consistent with acute presentation with potential threat to life or bodily function.  Differential diagnosis includes, but is not limited to, cervical sprain, cervical muscle strain, less likely cervical epidural abscess, cervical artery dissection, exam and presentation not consistent with meningitis  The patient is a 86 year old female who is relatively healthy presents with about a day of atraumatic neck pain.  The pain started high on the right side of the neck and now is radiating to more lateral right side and trap region.  It is worse with movement of the neck but she has no neurologic symptoms including no radicular symptoms and no visual change difficulty speaking etc.  She had no trauma she has no fevers or infectious symptoms and is not having any symptoms of ischemia including chest pain or shortness of breath.  On exam she looks well there is not much in the way of focal tenderness although she is mildly tender along the paraspinal musculature and trap region.  She can range the neck pain is worse with range to the right side.  She has normal strength in C5-T1 distribution sensation is intact and the rest of her neurologic exam including cranial nerves is intact.  I suspect this is primary musculoskeletal cervical strain versus cervical sprain.  Considered cervical artery dissection but given she has no neurologic findings, no trauma and no risk factors for this feel this.labs is less likely.  Labs including troponin basic metabolic panel and CBC were obtained from triage and are all reassuring.  Her EKG shows sinus rhythm with PACs.  Plan to treat supportively with Tylenol Lidoderm patch and short course of NSAIDs.  Given her age and history of hypertension we will only prescribe 5 days of ibuprofen.  Discussed follow-up with PCP if symptoms or not improving.  Patient feeling much improved after medications.   Will discharge with Lidoderm patch.     FINAL CLINICAL IMPRESSION(S) / ED DIAGNOSES   Final diagnoses:  Neck pain     Rx / DC Orders   ED Discharge Orders     None        Note:  This document was prepared using Dragon voice recognition software and may include unintentional dictation errors.   Georga Hacking, MD  08/04/22 0852  

## 2022-08-04 NOTE — ED Triage Notes (Signed)
Ambulatory with steady gait with c/o pain to back of neck x 2 days. Having difficulty sleeping due to pain. States pain " feels like a bad headache."  Denies injury.

## 2022-08-12 ENCOUNTER — Other Ambulatory Visit: Payer: Self-pay | Admitting: Family Medicine

## 2022-08-12 DIAGNOSIS — E785 Hyperlipidemia, unspecified: Secondary | ICD-10-CM

## 2022-08-14 ENCOUNTER — Ambulatory Visit (HOSPITAL_COMMUNITY)
Admission: RE | Admit: 2022-08-14 | Discharge: 2022-08-14 | Disposition: A | Discharge status: Home / Self Care | Payer: Medicare Other | Source: Ambulatory Visit | Attending: Cardiology | Admitting: Cardiology

## 2022-08-14 DIAGNOSIS — I6523 Occlusion and stenosis of bilateral carotid arteries: Secondary | ICD-10-CM | POA: Diagnosis not present

## 2022-08-14 DIAGNOSIS — I779 Disorder of arteries and arterioles, unspecified: Secondary | ICD-10-CM | POA: Diagnosis not present

## 2022-08-18 ENCOUNTER — Ambulatory Visit (HOSPITAL_COMMUNITY): Payer: Medicare Other | Attending: Interventional Cardiology

## 2022-08-18 ENCOUNTER — Telehealth: Payer: Self-pay | Admitting: *Deleted

## 2022-08-18 DIAGNOSIS — I10 Essential (primary) hypertension: Secondary | ICD-10-CM | POA: Diagnosis not present

## 2022-08-18 DIAGNOSIS — R9431 Abnormal electrocardiogram [ECG] [EKG]: Secondary | ICD-10-CM | POA: Insufficient documentation

## 2022-08-18 DIAGNOSIS — I779 Disorder of arteries and arterioles, unspecified: Secondary | ICD-10-CM

## 2022-08-18 DIAGNOSIS — E782 Mixed hyperlipidemia: Secondary | ICD-10-CM

## 2022-08-18 LAB — ECHOCARDIOGRAM COMPLETE
Area-P 1/2: 6.43 cm2
S' Lateral: 1.4 cm

## 2022-08-18 MED ORDER — ROSUVASTATIN CALCIUM 20 MG PO TABS: 20.0000 mg | ORAL_TABLET | Freq: Every day | ORAL | 3 refills | Status: DC

## 2022-08-18 NOTE — Telephone Encounter (Signed)
I spoke with patient and reviewed carotid doppler and echo results with her.  Prescription sent to [facility] in [place].  She will come in for fasting lab work on November 17, 2022

## 2022-08-18 NOTE — Telephone Encounter (Signed)
-----   Message from Jettie Booze, MD sent at 08/18/2022 10:52 AM EDT ----- Normal LV/RV/valvular function.  No cardiac cause of orthostatic hypotension.  Continue with plans for follow-up in the Pharm.D. hypertension clinic.

## 2022-08-18 NOTE — Telephone Encounter (Signed)
-----   Message from Jettie Booze, MD sent at 08/15/2022  5:44 PM EDT ----- Stop pravastatin.  Start rosuvastatin 20 mg daily.  Liver and lipids in 3 months.  Moderate carotid disease.  Repeat study in 12 months.

## 2022-08-30 ENCOUNTER — Other Ambulatory Visit: Payer: Self-pay | Admitting: Internal Medicine

## 2022-08-30 DIAGNOSIS — I10 Essential (primary) hypertension: Secondary | ICD-10-CM

## 2022-09-03 NOTE — Progress Notes (Signed)
Patient ID: Haley Randall                 DOB: 04-28-1936                      MRN: [id number]      HPI: Haley Randall is a 86 y.o. female referred by Dr. Irish Lack to HTN clinic. PMH is significant for orthostatic hypotension and chest pain. Seen by Dr. Irish Lack on 9/1. Was referred by PCP for orthostatics. Patient was not hydrating well. HCTZ was stopped and patient felt better. BP in office was high, so carvedilol 3.125mg  BID was started for blood pressure and chest pain.   Patient presents today to clinic. She did not start taking carvedilol. She states she is only taking amlodipine and rosuvastatin. No more check pain. Denies any dizziness, SOB, headache or swelling. Has not been checking blood pressure at home but does have an upper arm cuff. Her husband has dementia and she finds it stressful to take care of him, can get upset. BP was 150/60 in clinic today. No change on repeat.  Current HTN meds: amlodipine 10mg  daily, carvedilol 3.125mg  twice a day Previously tried: HCTZ (orthostatics) BP goal: <130/80  Family History: The patient's family history includes Heart disease in her father, mother, paternal grandfather, and paternal grandmother; Stroke in her maternal grandfather and maternal grandmother.  Social History: The patient  reports that she has quit smoking. She has never used smokeless tobacco. She reports that she does not currently use alcohol. She reports that she does not use drugs.   Diet: 1 cup coffee  Exercise: cleans house  Home BP readings: none  Wt Readings from Last 3 Encounters:  08/04/22 110 lb (49.9 kg)  08/01/22 109 lb 3.2 oz (49.5 kg)  06/24/22 107 lb 6 oz (48.7 kg)   BP Readings from Last 3 Encounters:  08/04/22 (!) 150/50  08/01/22 (!) 148/54  06/24/22 (!) 144/60   Pulse Readings from Last 3 Encounters:  08/04/22 (!) 50  08/01/22 76  06/24/22 98    Renal function: CrCl cannot be calculated (Patient's most recent lab result is older than the maximum  21 days allowed.).  Past Medical History:  Diagnosis Date   Chest pain    Dizziness    Hyperlipidemia    Hypertension    Idiopathic gout of foot 08/30/2020    Current Outpatient Medications on File Prior to Visit  Medication Sig Dispense Refill   amLODipine (NORVASC) 10 MG tablet Take 1 tablet (10 mg total) by mouth daily. 90 tablet 1   carvedilol (COREG) 3.125 MG tablet Take 1 tablet (3.125 mg total) by mouth 2 (two) times daily with a meal. 180 tablet 3   lidocaine (LIDODERM) 5 % Place 1 patch onto the skin daily. Remove & Discard patch within 12 hours or as directed by MD 30 patch 0   rosuvastatin (CRESTOR) 20 MG tablet Take 1 tablet (20 mg total) by mouth daily. 90 tablet 3   No current facility-administered medications on file prior to visit.    Allergies  Allergen Reactions   Lisinopril     Causes nose bleeds    There were no vitals taken for this visit.   Assessment/Plan:  1. Hypertension - Blood pressure is above goal of <130/80 in clinic today. Patient did not start carvedilol. She seemed a little confused by this, but was sure she was only taking 2 pills per day. Her HR in clinic is in the  60's. States she does not have any more chest pain. Thought that lisinopril caused her nose bleeds, but on further discussion, it sounds like the nose bleeds are more from dry nasal passages and a saline nasal spray prior to bedtime. No more orthostatics since stopping HCTZ. I will start irbesartan 75mg  daily. Follow up in clinic in 5 weeks. Patient will come for BMP in 2 weeks. She was counseled on proper technique on checking blood pressure and was asked to check blood pressure at home and bring in her readings and machine. Also reviewed foods high in sodium to avoid/limit.  We discussed increasing exercise to help with blood pressure. Set a goal to walk 10 min 3 times a week to start.  Thank you  , Pharm.D, BCPS, CPP [facility] [address] [place], [place]  Phone: [phone]; Fax: [phone]

## 2022-09-04 ENCOUNTER — Ambulatory Visit: Payer: Medicare Other | Attending: Cardiovascular Disease | Admitting: Pharmacist

## 2022-09-04 VITALS — BP 150/60 | HR 67

## 2022-09-04 DIAGNOSIS — I10 Essential (primary) hypertension: Secondary | ICD-10-CM | POA: Diagnosis not present

## 2022-09-04 MED ORDER — IRBESARTAN 75 MG PO TABS: 75.0000 mg | ORAL_TABLET | Freq: Every day | ORAL | 3 refills | Status: DC

## 2022-09-04 NOTE — Patient Instructions (Addendum)
Please start taking irbesartan 75mg  once daily Continue taking amlodipine 10mg  daily Try to go for a walk for 10 min 3 times a week Check blood pressure 1-2 times a day. Follow instructions below. Please bring your machine and list to next visit on 11/10 Lab work on 10/19. Please call me at [phone] with any questions  Your blood pressure goal is <130/80  To check your pressure at home you will need to:  1. Sit up in a chair, with feet flat on the floor and back supported. Do not cross your ankles or legs. 2. Rest your left arm so that the cuff is about heart level. If the cuff goes on your upper arm,  then just relax the arm on the table, arm of the chair or your lap. If you have a wrist cuff, we  suggest relaxing your wrist against your chest (think of it as Pledging the Flag with the  wrong arm).  3. Place the cuff snugly around your arm, about 1 inch above the crook of your elbow. The  cords should be inside the groove of your elbow.  4. Sit quietly, with the cuff in place, for about 5 minutes. After that 5 minutes press the power  button to start a reading. 5. Do not talk or move while the reading is taking place.  6. Record your readings on a sheet of paper. Although most cuffs have a memory, it is often  easier to see a pattern developing when the numbers are all in front of you.  7. You can repeat the reading after 1-3 minutes if it is recommended  Make sure your bladder is empty and you have not had caffeine or tobacco within the last 30 min  Always bring your blood pressure log with you to your appointments. If you have not brought your monitor in to be double checked for accuracy, please bring it to your next appointment.  You can find a list of validated (accurate) blood pressure cuffs at validatebp.org

## 2022-09-15 ENCOUNTER — Other Ambulatory Visit: Payer: Self-pay | Admitting: Internal Medicine

## 2022-09-15 DIAGNOSIS — I10 Essential (primary) hypertension: Secondary | ICD-10-CM

## 2022-09-18 ENCOUNTER — Ambulatory Visit: Payer: Medicare Other | Attending: Interventional Cardiology

## 2022-09-18 DIAGNOSIS — I10 Essential (primary) hypertension: Secondary | ICD-10-CM

## 2022-09-18 LAB — BASIC METABOLIC PANEL
BUN/Creatinine Ratio: 11 — ABNORMAL LOW (ref 12–28)
BUN: 18 mg/dL (ref 8–27)
CO2: 23 mmol/L (ref 20–29)
Calcium: 10.7 mg/dL — ABNORMAL HIGH (ref 8.7–10.3)
Chloride: 105 mmol/L (ref 96–106)
Creatinine, Ser: 1.6 mg/dL — ABNORMAL HIGH (ref 0.57–1.00)
Glucose: 122 mg/dL — ABNORMAL HIGH (ref 70–99)
Potassium: 4.4 mmol/L (ref 3.5–5.2)
Sodium: 142 mmol/L (ref 134–144)
eGFR: 32 mL/min/{1.73_m2} — ABNORMAL LOW (ref >59)

## 2022-10-02 ENCOUNTER — Ambulatory Visit (INDEPENDENT_AMBULATORY_CARE_PROVIDER_SITE_OTHER): Payer: Medicare Other | Admitting: Family

## 2022-10-02 ENCOUNTER — Encounter: Payer: Self-pay | Admitting: Family

## 2022-10-02 VITALS — BP 138/62 | HR 76 | Temp 98.6°F | Ht <= 58 in | Wt 107.0 lb

## 2022-10-02 DIAGNOSIS — D649 Anemia, unspecified: Secondary | ICD-10-CM | POA: Diagnosis not present

## 2022-10-02 DIAGNOSIS — I10 Essential (primary) hypertension: Secondary | ICD-10-CM | POA: Diagnosis not present

## 2022-10-02 DIAGNOSIS — Z23 Encounter for immunization: Secondary | ICD-10-CM

## 2022-10-02 DIAGNOSIS — R739 Hyperglycemia, unspecified: Secondary | ICD-10-CM

## 2022-10-02 DIAGNOSIS — E7800 Pure hypercholesterolemia, unspecified: Secondary | ICD-10-CM

## 2022-10-02 DIAGNOSIS — R7989 Other specified abnormal findings of blood chemistry: Secondary | ICD-10-CM | POA: Diagnosis not present

## 2022-10-02 LAB — BASIC METABOLIC PANEL
BUN: 11 mg/dL (ref 6–23)
CO2: 27 mEq/L (ref 19–32)
Calcium: 10.4 mg/dL (ref 8.4–10.5)
Chloride: 108 mEq/L (ref 96–112)
Creatinine, Ser: 1.6 mg/dL — ABNORMAL HIGH (ref 0.40–1.20)
GFR: 29.51 mL/min — ABNORMAL LOW (ref >60.00)
Glucose, Bld: 96 mg/dL (ref 70–99)
Potassium: 4 mEq/L (ref 3.5–5.1)
Sodium: 144 mEq/L (ref 135–145)

## 2022-10-02 LAB — IBC + FERRITIN
Ferritin: 11.9 ng/mL (ref 10.0–291.0)
Iron: 65 ug/dL (ref 42–145)
Saturation Ratios: 15.5 % — ABNORMAL LOW (ref 20.0–50.0)
TIBC: 420 ug/dL (ref 250.0–450.0)
Transferrin: 300 mg/dL (ref 212.0–360.0)

## 2022-10-02 LAB — HEMOGLOBIN A1C: Hgb A1c MFr Bld: 6.2 % (ref 4.6–6.5)

## 2022-10-02 LAB — VITAMIN D 25 HYDROXY (VIT D DEFICIENCY, FRACTURES): VITD: 28.1 ng/mL — ABNORMAL LOW (ref 30.00–100.00)

## 2022-10-02 NOTE — Progress Notes (Signed)
Established Patient Office Visit  Subjective:  Patient ID: Haley Randall, female    DOB: 06/25/1936  Age: 86 y.o. MRN: [id number]  CC:  Chief Complaint  Patient presents with   Establish Care    Toc from Dr. Einar Pheasant no new problems.     HPI Haley Randall is here for a transition of care visit.  Prior provider was: Dr. Waunita Schooner  Pt is without acute concerns.   chronic concerns:  Elevated calcium: denies daily use of calcium and or tums supplementation.   Elevated creatinine: has since increased her water intake in the last few weeks since last lab however she tells me she drinks about one container that is about 16 oz. She states occasionally she will drink two a day.   Elevated glucose: on last labwork, not sure if she was fasting or not.   Slight anemia: possible h/o IDA, pt is unsure. Denies fatigue and or sob or doe.   HTN: sees cardiologist for orthostatic hypotension and chest pain. Orthostatics did resolve per cardiology note when d/c hctz. Started pt on irbesartan 75 mg daily. Was told to f/u in five weeks from last appt, has f/u appt 11/10. Today blood pressure in desired range : 138/62. States has been checking her blood pressure twice a day at home , last night 115/80 or so. denies dizzy spells   Past Medical History:  Diagnosis Date   Chest pain    Dizziness    Hyperlipidemia    Hypertension    Idiopathic gout of foot 08/30/2020    Past Surgical History:  Procedure Laterality Date   BREAST SURGERY Left 1980   biopsy    Family History  Problem Relation Age of Onset   Heart disease Mother    Heart disease Father    Stroke Maternal Grandmother    Stroke Maternal Grandfather    Heart disease Paternal Grandmother    Heart disease Paternal Grandfather    Cancer Neg Hx    Diabetes Neg Hx     Social History   Socioeconomic History   Marital status: Married    Spouse name: Scientist, water quality   Number of children: 1   Years of education: Not on file   Highest  education level: Not on file  Occupational History   Occupation: retired  Tobacco Use   Smoking status: Former   Smokeless tobacco: Never   Tobacco comments:    quit 30 years ago  Vaping Use   Vaping Use: Never used  Substance and Sexual Activity   Alcohol use: Not Currently   Drug use: No   Sexual activity: Not Currently  Other Topics Concern   Not on file  Social History Narrative   05/23/22   From: originally from [place] then [place] with husband Investment banker, corporate) and grandson   Work: retired - book keeping      Family: one adopted daughter - Hinton Dyer      Enjoys: nothing currently      Exercise: not currently   Diet: avoids certain foods - seafood, red meat      Safety   Seat belts: Yes    Guns: No   Safe in relationships: Yes       Social Determinants of Health   Financial Resource Strain: Low Risk  (06/05/2022)   Overall Financial Resource Strain (CARDIA)    Difficulty of Paying Living Expenses: Not hard at all  Food Insecurity: No Food Insecurity (06/05/2022)   Hunger Vital Sign  Worried About Charity fundraiser in the Last Year: Never true    [place] in the Last Year: Never true  Transportation Needs: No Transportation Needs (06/05/2022)   PRAPARE - Hydrologist (Medical): No    Lack of Transportation (Non-Medical): No  Physical Activity: Inactive (06/05/2022)   Exercise Vital Sign    Days of Exercise per Week: 0 days    Minutes of Exercise per Session: 0 min  Stress: No Stress Concern Present (06/05/2022)   [place]    Feeling of Stress : Not at all  Social Connections: [place] (06/05/2022)   Social Connection and Isolation Panel [NHANES]    Frequency of Communication with Friends and Family: More than three times a week    Frequency of Social Gatherings with Friends and Family: More than three times a week    Attends Religious Services: 1 to 4 times per  year    Active Member of [employer] or Organizations: Yes    Attends Archivist Meetings: 1 to 4 times per year    Marital Status: Married  Human resources officer Violence: Not At Risk (06/05/2022)   Humiliation, Afraid, Rape, and Kick questionnaire    Fear of Current or Ex-Partner: No    Emotionally Abused: No    Physically Abused: No    Sexually Abused: No    Outpatient Medications Prior to Visit  Medication Sig Dispense Refill   amLODipine (NORVASC) 10 MG tablet TAKE 1 TABLET BY MOUTH EVERY DAY 90 tablet 1   irbesartan (AVAPRO) 75 MG tablet Take 1 tablet (75 mg total) by mouth daily. 90 tablet 3   rosuvastatin (CRESTOR) 20 MG tablet Take 1 tablet (20 mg total) by mouth daily. 90 tablet 3   lidocaine (LIDODERM) 5 % Place 1 patch onto the skin daily. Remove & Discard patch within 12 hours or as directed by MD 30 patch 0   No facility-administered medications prior to visit.    Allergies  Allergen Reactions   Lisinopril     Causes nose bleeds    ROS Review of Systems  Review of Systems  Respiratory:  Negative for shortness of breath.   Cardiovascular:  Negative for chest pain and palpitations.  Gastrointestinal:  Negative for constipation and diarrhea.  Genitourinary:  Negative for dysuria, frequency and urgency.  Musculoskeletal:  Negative for myalgias.  Psychiatric/Behavioral:  Negative for depression and suicidal ideas.   All other systems reviewed and are negative.    Objective:    Physical Exam  Gen: NAD, resting comfortably CV: RRR with no murmurs appreciated Pulm: NWOB, CTAB with no crackles, wheezes, or rhonchi Skin: warm, dry Psych: Normal affect and thought content  BP 138/62   Pulse 76   Temp 98.6 F (37 C) (Oral)   Ht _0  (1.473 m)   Wt 107 lb (48.5 kg)   SpO2 98%   BMI 22.36 kg/m  Wt Readings from Last 3 Encounters:  10/02/22 107 lb (48.5 kg)  08/04/22 110 lb (49.9 kg)  08/01/22 109 lb 3.2 oz (49.5 kg)     Health Maintenance Due  Topic  Date Due   Pneumonia Vaccine 10+ Years old (2 - PCV) 11/19/2022    There are no preventive care reminders to display for this patient.  Lab Results  Component Value Date   TSH 3.42 06/24/2022   Lab Results  Component Value Date   WBC 4.7 08/04/2022  HGB 11.9 (L) 08/04/2022   HCT 38.0 08/04/2022   MCV 86.8 08/04/2022   PLT 216 08/04/2022   Lab Results  Component Value Date   NA 144 10/02/2022   K 4.0 10/02/2022   CO2 27 10/02/2022   GLUCOSE 96 10/02/2022   BUN 11 10/02/2022   CREATININE 1.60 (H) 10/02/2022   BILITOT 1.6 (H) 08/04/2022   ALKPHOS 60 08/04/2022   AST 31 08/04/2022   ALT 16 08/04/2022   PROT 7.7 08/04/2022   ALBUMIN 4.1 08/04/2022   CALCIUM 10.4 10/02/2022   ANIONGAP 7 08/04/2022   EGFR 32 (L) 09/18/2022   GFR 29.51 (L) 10/02/2022   Lab Results  Component Value Date   CHOL 204 (H) 06/24/2022   Lab Results  Component Value Date   HDL 93.80 06/24/2022   Lab Results  Component Value Date   LDLCALC 94 06/24/2022   Lab Results  Component Value Date   TRIG 81.0 06/24/2022   Lab Results  Component Value Date   CHOLHDL 2 06/24/2022   Lab Results  Component Value Date   HGBA1C 6.2 10/02/2022      Assessment & Plan:   Problem List Items Addressed This Visit       Cardiovascular and Mediastinum   Primary hypertension    Continue f/u with cardiologist.  Continue irbesartan 75 mg once daily.          Other   HLD (hyperlipidemia)    Advised to Work on low cholesterol diet and exercise as tolerated       Anemia    Ordering cbc pending results.       Relevant Orders   IBC + Ferritin (Completed)   Hyperglycemia    Pt advised of the following: Work on a diabetic diet, try to incorporate exercise at least 20-30 a day for 3 days a week or more.        Relevant Orders   Hemoglobin A1c (Completed)   Hypercalcemia    Not currently supplementing Repeat calcium  Order pth and also ordering vitd pending results.       Relevant  Orders   Basic metabolic panel (Completed)   VITAMIN D 25 Hydroxy (Vit-D Deficiency, Fractures) (Completed)   PTH, Intact and Calcium   Other Visit Diagnoses     Need for immunization against influenza    -  Primary   Relevant Orders   Flu Vaccine QUAD High Dose(Fluad) (Completed)   Elevated serum creatinine       Relevant Orders   Basic metabolic panel (Completed)       No orders of the defined types were placed in this encounter.   Follow-up: Return in about 6 months (around 04/02/2023) for f/u cholesterol.    Eugenia Pancoast, FNP

## 2022-10-02 NOTE — Assessment & Plan Note (Signed)
Continue f/u with cardiologist.  Continue irbesartan 75 mg once daily.

## 2022-10-02 NOTE — Assessment & Plan Note (Signed)
Ordering cbc pending results ?

## 2022-10-02 NOTE — Assessment & Plan Note (Signed)
Advised to Work on low cholesterol diet and exercise as tolerated  

## 2022-10-02 NOTE — Assessment & Plan Note (Signed)
Not currently supplementing Repeat calcium  Order pth and also ordering vitd pending results.

## 2022-10-02 NOTE — Assessment & Plan Note (Signed)
Pt advised of the following: Work on a diabetic diet, try to incorporate exercise at least 20-30 a day for 3 days a week or more.   

## 2022-10-02 NOTE — Patient Instructions (Signed)
  Stop by the lab prior to leaving today. I will notify you of your results once received.    Regards,   Xuan Mateus FNP-C  

## 2022-10-03 LAB — PTH, INTACT AND CALCIUM
Calcium: 10.1 mg/dL (ref 8.6–10.4)
PTH: 114 pg/mL — ABNORMAL HIGH (ref 16–77)

## 2022-10-07 ENCOUNTER — Other Ambulatory Visit: Payer: Self-pay | Admitting: Family

## 2022-10-07 ENCOUNTER — Encounter: Payer: Self-pay | Admitting: Family

## 2022-10-07 DIAGNOSIS — E210 Primary hyperparathyroidism: Secondary | ICD-10-CM

## 2022-10-07 DIAGNOSIS — R7989 Other specified abnormal findings of blood chemistry: Secondary | ICD-10-CM

## 2022-10-07 NOTE — Progress Notes (Signed)
Haley Randall,  I recently tested your parathyroid hormone (PTH) because of your elevated calcium, and it came back elevated. This suggests that there could be a problem with your parathyroid hormone. You would typically see an endocrinologist as a next step to look further into this. Have you seen an endo before or would you need a referral?  Your iron is ok. Your kidney function is on the lower end of normal but seems to remain stable. Do you see a kidney specialist/nephrologist?  Vitamin d also on the lower end. I suggest daily 1000 mcg vitamin D3 over the counter. Nothing higher due to kidney status.  Hga1c slightly elevated. Continue to focus on diabetic diet and healthy choices.

## 2022-10-07 NOTE — Progress Notes (Signed)
  Haley Randall,   I recently tested your parathyroid hormone (PTH) because of your elevated calcium, and it came back elevated. This suggests that there could be a problem with your parathyroid hormone. You would typically see an endocrinologist as a next step to look further into this. Have you seen an endo before or would you need a referral?   Your iron is ok.  Your kidney function is on the lower end of normal but seems to remain stable. Do you see a kidney specialist/nephrologist?   Vitamin d also on the lower end. I suggest daily 1000 mcg vitamin D3 over the counter. Nothing higher due to kidney status.   Hga1c slightly elevated. Continue to focus on diabetic diet and healthy choices.

## 2022-10-10 ENCOUNTER — Other Ambulatory Visit: Payer: Self-pay | Admitting: Family

## 2022-10-10 ENCOUNTER — Ambulatory Visit: Payer: Medicare Other | Attending: Interventional Cardiology | Admitting: Pharmacist

## 2022-10-10 VITALS — BP 132/40

## 2022-10-10 DIAGNOSIS — E210 Primary hyperparathyroidism: Secondary | ICD-10-CM

## 2022-10-10 DIAGNOSIS — R7989 Other specified abnormal findings of blood chemistry: Secondary | ICD-10-CM

## 2022-10-10 DIAGNOSIS — I10 Essential (primary) hypertension: Secondary | ICD-10-CM

## 2022-10-10 NOTE — Progress Notes (Signed)
Referral placed for endo  

## 2022-10-10 NOTE — Progress Notes (Addendum)
Patient ID: Haley Randall                 DOB: 03-05-1936                      MRN: [id number]      HPI: Haley Randall is a 86 y.o. female referred by Dr. Eldridge Dace to HTN clinic initially for orthostatics. PMH is significant for orthostatic hypotension and chest pain. At last visit on 10/5, reported having no more episodes of orthostatics since stopping the HCTZ, but had above goal readings and was not taking her carvedilol. She was switched off her carvedilol to irbesartan 75 mg QD. Cr since starting her irbesartan has been at 1.60 (CrCl ~20 ml/min) due to not hydrating well and potassium within normal limits.  At last visit with her PCP blood pressure was 138/62, and patient has been checking it at home twice daily.  Current HTN meds: amlodipine 10mg  daily, irbesartan 75 mg daily Previously tried: HCTZ (orthostatics) BP goal: <130/80  Family History: The patient's family history includes Heart disease in her father, mother, paternal grandfather, and paternal grandmother; Stroke in her maternal grandfather and maternal grandmother.   Social History: The patient reports that she has quit smoking. She has never used smokeless tobacco. She reports that she does not currently use alcohol. She reports that she does not use drugs.    Diet: Breakfast: cheerios Lunch: nothing, snacks on sweets (candy) Dinner: sandwich, tomato and cheese Not allowed to eat red meats, fish, shrimp--> can have breaded chicken, , oven cooked Liquids: coffee in AM (1 cup), has been increasing water intake, No soda, sometimes has a hot chocolate, milk shake  Exercise: Clean house, used to do a lot of walking- legs get tired now, husband has alzheimers continue to walk but cuts it off  Home BP readings:   139/60 110/54 131/56 130/62 136/62 122/59 129/61 139/64 134/54  Office BP readings:  machine: 139/61, 139/59, 136/48 132/40, 136/40 Pulse 66  Wt Readings from Last 3 Encounters:  10/02/22 107 lb  (48.5 kg)  08/04/22 110 lb (49.9 kg)  08/01/22 109 lb 3.2 oz (49.5 kg)   BP Readings from Last 3 Encounters:  10/10/22 (!) 132/40  10/02/22 138/62  09/04/22 (!) 150/60   Pulse Readings from Last 3 Encounters:  10/02/22 76  09/04/22 67  08/04/22 (!) 50    Renal function: Estimated Creatinine Clearance: 16.9 mL/min (A) (by C-G formula based on SCr of 1.6 mg/dL (H)).  Past Medical History:  Diagnosis Date   Chest pain    Dizziness    Hyperlipidemia    Hypertension    Idiopathic gout of foot 08/30/2020    Current Outpatient Medications on File Prior to Visit  Medication Sig Dispense Refill   amLODipine (NORVASC) 10 MG tablet TAKE 1 TABLET BY MOUTH EVERY DAY 90 tablet 1   irbesartan (AVAPRO) 75 MG tablet Take 1 tablet (75 mg total) by mouth daily. 90 tablet 3   rosuvastatin (CRESTOR) 20 MG tablet Take 1 tablet (20 mg total) by mouth daily. 90 tablet 3   No current facility-administered medications on file prior to visit.    Allergies  Allergen Reactions   Lisinopril     Causes nose bleeds    Assessment/Plan: Assessment: Bp readings have been above goal systolically, but has improved from last visit Adherent to medications of amlodipine 10 mg and irbesartan 75 mg daily Patient usually keeps feet crossed and does not keep arm at  heart level when checking bp Eats a lot of sweets in diet instead of a meal at lunch time Physical activity consists of cleaning the house, used to do a lot of walking with husband In clinic diastolic is quite low   Plan Monitor blood pressure using proper technique: feet flat on ground, emptied bladder, arm at heart level, sit in chair with back support, and wait 5-10 minutes prior to first reading and if still elevated take another reading Given patients age and lower diastolic readings will not push regimen. Continue the same regimen of amlodipine 10 mg and irbesartan 75 mg Increase physical activity slowly and continue to add walking Try to  eat less hard candy and more hearty foods; try to eat more chicken and [place] Contact clinic if blood pressure starts to elevate again (Systolic blood pressure >140)  Thank you  Arabella Merles, PharmD. [facility] Acute Care PGY-1 10/10/2022 9:08 AM   Olene Floss, Pharm.D, BCPS, CPP [facility] of [facility] [address], [place], [place] [address]  Phone: [phone]; Fax: [phone]

## 2022-10-10 NOTE — Patient Instructions (Addendum)
-  Monitor blood pressure using proper technique: feet flat on ground, emptied bladder, arm at heart level, sit in chair with back support, and wait 5-10 minutes prior to first reading and if still elevated take another reading -Continue the same regimen of amlodipine 10 mg and irbesartan 75 mg -Increase physical activity slowly and continue to add walking -Try to eat less hard candy and more hearty foods; try to eat more chicken and [place]

## 2022-10-10 NOTE — Assessment & Plan Note (Addendum)
Assessment: Bp readings have been above goal systolically, but has improved from last visit Adherent to medications of amlodipine 10 mg and irbesartan 75 mg daily Patient usually keeps feet crossed and does not keep arm at heart level when checking bp Eats a lot of sweets in diet instead of a meal at lunch time Physical activity consists of cleaning the house, used to do a lot of walking with husband  Plan Monitor blood pressure using proper technique: feet flat on ground, emptied bladder, arm at heart level, sit in chair with back support, and wait 5-10 minutes prior to first reading and if still elevated take another reading Continue the same regimen of amlodipine 10 mg and irbesartan 75 mg Increase physical activity slowly and continue to add walking Try to eat less hard candy and more hearty foods; try to eat more chicken and [place] Contact clinic if blood pressure starts to elevate again (Systolic blood pressure >140)

## 2022-11-17 ENCOUNTER — Ambulatory Visit: Payer: Medicare Other | Attending: Interventional Cardiology

## 2022-11-17 DIAGNOSIS — E782 Mixed hyperlipidemia: Secondary | ICD-10-CM

## 2022-11-17 LAB — LIPID PANEL
Chol/HDL Ratio: 1.7 ratio (ref 0.0–4.4)
Cholesterol, Total: 144 mg/dL (ref 100–199)
HDL: 87 mg/dL (ref >39)
LDL Chol Calc (NIH): 44 mg/dL (ref 0–99)
Triglycerides: 67 mg/dL (ref 0–149)
VLDL Cholesterol Cal: 13 mg/dL (ref 5–40)

## 2022-11-17 LAB — HEPATIC FUNCTION PANEL
ALT: 21 IU/L (ref 0–32)
AST: 36 IU/L (ref 0–40)
Albumin: 4.2 g/dL (ref 3.7–4.7)
Alkaline Phosphatase: 88 IU/L (ref 44–121)
Bilirubin Total: 0.7 mg/dL (ref 0.0–1.2)
Bilirubin, Direct: 0.23 mg/dL (ref 0.00–0.40)
Total Protein: 6.5 g/dL (ref 6.0–8.5)

## 2023-03-09 ENCOUNTER — Other Ambulatory Visit: Payer: Self-pay | Admitting: Internal Medicine

## 2023-03-09 DIAGNOSIS — I10 Essential (primary) hypertension: Secondary | ICD-10-CM

## 2023-03-11 ENCOUNTER — Other Ambulatory Visit: Payer: Self-pay | Admitting: Internal Medicine

## 2023-03-11 ENCOUNTER — Telehealth: Payer: Self-pay | Admitting: Family

## 2023-03-11 DIAGNOSIS — I10 Essential (primary) hypertension: Secondary | ICD-10-CM

## 2023-03-11 MED ORDER — AMLODIPINE BESYLATE 10 MG PO TABS: 10.0000 mg | ORAL_TABLET | Freq: Every day | ORAL | 1 refills | Status: DC

## 2023-03-11 NOTE — Telephone Encounter (Signed)
Prescription Request  03/11/2023  LOV: 10/02/2022  What is the name of the medication or equipment? amLODipine (NORVASC) 10 MG tablet   Have you contacted your pharmacy to request a refill? No   Which pharmacy would you like this sent to?  [id number] [address]en, [place] - [address] [place] [address] Phone: [phone] Fax: [phone]    Patient notified that their request is being sent to the clinical staff for review and that they should receive a response within 2 business days.   Please advise at [phone]

## 2023-08-05 ENCOUNTER — Encounter: Payer: Self-pay | Admitting: Internal Medicine

## 2023-08-05 ENCOUNTER — Ambulatory Visit (INDEPENDENT_AMBULATORY_CARE_PROVIDER_SITE_OTHER): Payer: Medicare Other | Admitting: Internal Medicine

## 2023-08-05 DIAGNOSIS — N1832 Chronic kidney disease, stage 3b: Secondary | ICD-10-CM

## 2023-08-05 DIAGNOSIS — E559 Vitamin D deficiency, unspecified: Secondary | ICD-10-CM | POA: Diagnosis not present

## 2023-08-05 LAB — BASIC METABOLIC PANEL
BUN: 16 mg/dL (ref 6–23)
CO2: 28 meq/L (ref 19–32)
Calcium: 10.3 mg/dL (ref 8.4–10.5)
Chloride: 109 meq/L (ref 96–112)
Creatinine, Ser: 1.65 mg/dL — ABNORMAL HIGH (ref 0.40–1.20)
GFR: 28.27 mL/min — ABNORMAL LOW (ref >60.00)
Glucose, Bld: 92 mg/dL (ref 70–99)
Potassium: 3.4 meq/L — ABNORMAL LOW (ref 3.5–5.1)
Sodium: 146 meq/L — ABNORMAL HIGH (ref 135–145)

## 2023-08-05 LAB — VITAMIN D 25 HYDROXY (VIT D DEFICIENCY, FRACTURES): VITD: 27.13 ng/mL — ABNORMAL LOW (ref 30.00–100.00)

## 2023-08-05 LAB — PHOSPHORUS: Phosphorus: 2.4 mg/dL (ref 2.3–4.6)

## 2023-08-05 LAB — ALBUMIN: Albumin: 4.1 g/dL (ref 3.5–5.2)

## 2023-08-05 NOTE — Progress Notes (Signed)
Name: Haley Randall  MRN/ DOB: [id number], 07-30-1936    Age/ Sex: 87 y.o., female    PCP: Mort Sawyers, FNP   Reason for Endocrinology Evaluation: Hypercalcemia      Date of Initial Endocrinology Evaluation: 08/05/2023     HPI: [name] is a 87 y.o. female with a past medical history of HTN. The patient presented for initial endocrinology clinic visit on 08/05/2023 for consultative assistance with her Hypercalcemia .   [name] indicates that she was noted with intermittently elevated serum calcium in 2020.  With a max serum level of 11 mg/DL 05/9628 (corrected at normal 10.44 Mg/DL).  Of note, patient has elevated PTH at 114 PG/mL 10/2022 as well as CKD III.   The patient was on HCTZ which had to be discontinued due to orthostatic hypotension.  She  has not experienced symptoms of constipation, polyuria, polydipsia,  diffuse muscle pains, or significant memory impairment.  She denies use of over the counter calcium (including supplements, Tums, Rolaids, or other calcium containing antacids), lithium,  or vitamin D supplements.    She denies  history of kidney stones, liver disease, granulomatous disease but has CKD. She denies  osteoporosis or prior fractures. Daily dietary calcium intake: 2  servings . She denies  family history of osteoporosis, parathyroid disease, thyroid disease.      HISTORY:  Past Medical History:  Past Medical History:  Diagnosis Date  . Chest pain   . Dizziness   . Hyperlipidemia   . Hypertension   . Idiopathic gout of foot 08/30/2020   Past Surgical History:  Past Surgical History:  Procedure Laterality Date  . BREAST SURGERY Left 1980   biopsy    Social History:  reports that she has quit smoking. She has never used smokeless tobacco. She reports that she does not currently use alcohol. She reports that she does not use drugs. Family History: family history includes Heart disease in her father, mother, paternal grandfather, and  paternal grandmother; Stroke in her maternal grandfather and maternal grandmother.   HOME MEDICATIONS: Allergies as of 08/05/2023       Reactions   Lisinopril    Causes nose bleeds        Medication List        Accurate as of August 05, 2023  8:56 AM. If you have any questions, ask your nurse or doctor.          amLODipine 10 MG tablet Commonly known as: NORVASC Take 1 tablet (10 mg total) by mouth daily.   irbesartan 75 MG tablet Commonly known as: AVAPRO Take 1 tablet (75 mg total) by mouth daily.   rosuvastatin 20 MG tablet Commonly known as: CRESTOR Take 1 tablet (20 mg total) by mouth daily.          REVIEW OF SYSTEMS: A comprehensive ROS was conducted with the patient and is negative except as per HPI     OBJECTIVE:  VS: BP 120/68 (BP Location: Left Arm, Patient Position: Sitting, Cuff Size: Small)   Pulse 77   Ht 4\' 10"  (1.473 m)   Wt 104 lb 9.6 oz (47.4 kg)   SpO2 97%   BMI 21.86 kg/m    Wt Readings from Last 3 Encounters:  08/05/23 104 lb 9.6 oz (47.4 kg)  10/02/22 107 lb (48.5 kg)  08/04/22 110 lb (49.9 kg)     EXAM: General: Pt appears well and is in NAD  Neck: General: Supple without adenopathy. Thyroid: Thyroid  size normal.  No goiter or nodules appreciated.  Lungs: Clear with good BS bilat   Heart: Auscultation: RRR.  Abdomen: Soft, nontender  Extremities:  BL LE: No pretibial edema   Mental Status: Judgment, insight: Intact Orientation: Oriented to time, place, and person Mood and affect: No depression, anxiety, or agitation     DATA REVIEWED:   Latest Reference Range & Units 08/05/23 09:23  Sodium 135 - 145 mEq/L 146 (H)  Potassium 3.5 - 5.1 mEq/L 3.4 (L)  Chloride 96 - 112 mEq/L 109  CO2 19 - 32 mEq/L 28  Glucose 70 - 99 mg/dL 92  BUN 6 - 23 mg/dL 16  Creatinine 4.09 - 8.11 mg/dL 9.14 (H)  Calcium 8.4 - 10.5 mg/dL 78.2  Calcium Ionized 4.7 - 5.5 mg/dL 5.5  Phosphorus 2.3 - 4.6 mg/dL 2.4  Albumin 3.5 - 5.2 g/dL  4.1  GFR >95.62 mL/min 28.27 (L)    Latest Reference Range & Units 08/05/23 09:23  VITD 30.00 - 100.00 ng/mL 27.13 (L)    Latest Reference Range & Units 08/05/23 09:23  PTH, Intact 16 - 77 pg/mL 66      Old records , labs and images have been reviewed.   ASSESSMENT/PLAN/RECOMMENDATIONS:   Hypercalcemia:   -Patient asymptomatic -Patient has been noted with intermittent hypercalcemia, the highest corrected serum calcium 10.58 mg/dL on 12/31/8655 (serum calcium 10.9)  -Discussed pathophysiology of parathyroid hormone, we also discussed the impact of hypercalcemia on endorgan damage -Patient will need further evaluation we will proceed with BMP, albumin, ionized calcium, phosphorus, PTH and vitamin D - will also proceed with 24-hour urine collection for calcium   Recommendations Stay hydrated Avoid over-the-counter calcium tablets Consume 2-3 servings of dietary calcium   2. Vitamin D Insufficiency :   -Vitamin D low, patient will be advised to start OTC vitamin D as below  Medication  Start vitamin D3 at 1000 IU daily   3. CKD III/IV    -GFR has been fluctuating between 28-29 -Phosphorus, PTH and serum calcium are normal on today's labs -A referral to nephrology will be placed  F/U in 6 months    Signed electronically by: Lyndle Herrlich, MD  [facility]  [facility] [address] [place], [place] [address] Phone: [phone] FAX: [phone]   CC: Mort Sawyers, FNP [address] [place] [address] Phone: [phone] Fax: [phone]   Return to Endocrinology clinic as below: Future Appointments  Date Time Provider Department Center  08/05/2023  9:10 AM Kenneth Cuaresma, Konrad Dolores, MD [facility] None  08/19/2023  9:00 AM [facility] 4 [facility]  10/05/2023 12:00 PM Corky Crafts, MD [facility]

## 2023-08-05 NOTE — Patient Instructions (Signed)
Stay Hydrated  Avoid over the counter calcium tablets Consume 2-3 servings of dietary calcium ( low fat dairy such as milk, yogurt or cheese as well as green leafy vegetables )     24-Hour Urine Collection  You will be collecting your urine for a 24-hour period of time. Your timer starts with your first urine of the morning (For example - If you first pee at 9AM, your timer will start at 9AM) Throw away your first urine of the morning Collect your urine every time you pee for the next 24 hours STOP your urine collection 24 hours after you started the collection (For example - You would stop at 9AM the day after you started)

## 2023-08-06 LAB — PARATHYROID HORMONE, INTACT (NO CA): PTH: 66 pg/mL (ref 16–77)

## 2023-08-06 LAB — CALCIUM, IONIZED: Calcium, Ion: 5.5 mg/dL (ref 4.7–5.5)

## 2023-08-07 ENCOUNTER — Other Ambulatory Visit: Payer: Self-pay | Admitting: Interventional Cardiology

## 2023-08-07 ENCOUNTER — Telehealth: Payer: Self-pay | Admitting: Internal Medicine

## 2023-08-07 DIAGNOSIS — N1832 Chronic kidney disease, stage 3b: Secondary | ICD-10-CM | POA: Insufficient documentation

## 2023-08-07 DIAGNOSIS — E559 Vitamin D deficiency, unspecified: Secondary | ICD-10-CM | POA: Insufficient documentation

## 2023-08-07 NOTE — Telephone Encounter (Signed)
Please let the patient know that her calcium levels have normalized as well as her parathyroid hormone.  Vitamin D is low, please start over-the-counter vitamin D3 at 1000 IU daily   Patient must consume 2-3 servings of dietary calcium)  (low-fat dairy/green leafy vegetables )calcium and avoid over-the-counter calcium tablets   Patient with low kidney function, a referral to nephrology has been placed   Thanks

## 2023-08-07 NOTE — Telephone Encounter (Signed)
LMTCB

## 2023-08-10 ENCOUNTER — Other Ambulatory Visit: Payer: Medicare Other

## 2023-08-10 NOTE — Telephone Encounter (Signed)
Patient has been advised and verbalized understanding.

## 2023-08-11 LAB — CALCIUM, URINE, 24 HOUR: Calcium, 24H Urine: 134 mg/(24.h)

## 2023-08-11 LAB — CREATININE, URINE, 24 HOUR: Creatinine, 24H Ur: 0.75 g/(24.h) (ref 0.50–2.15)

## 2023-08-19 ENCOUNTER — Ambulatory Visit (HOSPITAL_COMMUNITY)
Admission: RE | Admit: 2023-08-19 | Discharge: 2023-08-19 | Disposition: A | Discharge status: Home / Self Care | Payer: Medicare Other | Source: Ambulatory Visit | Attending: Cardiovascular Disease | Admitting: Cardiovascular Disease

## 2023-08-19 DIAGNOSIS — I779 Disorder of arteries and arterioles, unspecified: Secondary | ICD-10-CM | POA: Insufficient documentation

## 2023-08-19 DIAGNOSIS — I6523 Occlusion and stenosis of bilateral carotid arteries: Secondary | ICD-10-CM | POA: Insufficient documentation

## 2023-08-21 ENCOUNTER — Other Ambulatory Visit: Payer: Self-pay | Admitting: *Deleted

## 2023-08-21 DIAGNOSIS — I779 Disorder of arteries and arterioles, unspecified: Secondary | ICD-10-CM

## 2023-08-24 ENCOUNTER — Ambulatory Visit (INDEPENDENT_AMBULATORY_CARE_PROVIDER_SITE_OTHER): Payer: Medicare Other | Admitting: *Deleted

## 2023-08-24 VITALS — Ht <= 58 in | Wt 104.0 lb

## 2023-08-24 DIAGNOSIS — Z0000 Encounter for general adult medical examination without abnormal findings: Secondary | ICD-10-CM

## 2023-08-24 NOTE — Patient Instructions (Signed)
Haley Randall , Thank you for taking time to come for your Medicare Wellness Visit. I appreciate your ongoing commitment to your health goals. Please review the following plan we discussed and let me know if I can assist you in the future.   Referrals/Orders/Follow-Ups/Clinician Recommendations: Discussed getting vaccines updated. Tetanus, Pneumonia, Fu, Covid  This is a list of the screening recommended for you and due dates:  Health Maintenance  Topic Date Due   DTaP/Tdap/Td vaccine (1 - Tdap) Never done   DEXA scan (bone density measurement)  Never done   Pneumonia Vaccine (2 of 2 - PCV) 11/19/2022   Flu Shot  07/02/2023   COVID-19 Vaccine (3 - 2023-24 season) 08/02/2023   Medicare Annual Wellness Visit  08/23/2024   Zoster (Shingles) Vaccine  Completed   HPV Vaccine  Aged Out    Advanced directives: (Declined) Advance directive discussed with you today. Even though you declined this today, please call our office should you change your mind, and we can give you the proper paperwork for you to fill out.  Next Medicare Annual Wellness Visit scheduled for next year: Yes 08/29/24 @ 9:00

## 2023-08-24 NOTE — Progress Notes (Signed)
Subjective:   Irene Macbeth is a 87 y.o. female who presents for Medicare Annual (Subsequent) preventive examination.  Visit Complete: Virtual  I connected with  Elonda Husky on 08/24/23 by a audio enabled telemedicine application and verified that I am speaking with the correct person using two identifiers.  Patient Location: Home  Provider Location: Office/Clinic  I discussed the limitations of evaluation and management by telemedicine. The patient expressed understanding and agreed to proceed.   Vital Signs: Unable to obtain new vitals due to this being a telehealth visit.   Cardiac Risk Factors include: advanced age (>62men, >29 women);sedentary lifestyle;dyslipidemia;hypertension     Objective:    Today's Vitals   08/24/23 1418  Weight: 104 lb (47.2 kg)  Height: 4\' 10"  (1.473 m)   Body mass index is 21.74 kg/m.     08/24/2023    2:29 PM 06/05/2022    1:52 PM 09/19/2019   10:13 AM 08/06/2018    7:50 AM 11/03/2017   10:17 AM  Advanced Directives  Does Patient Have a Medical Advance Directive? Yes Yes No No No  Type of Estate agent of [place];Living will Healthcare Power of Attorney     Does patient want to make changes to medical advance directive?  No - Patient declined     Copy of Healthcare Power of Attorney in Chart? No - copy requested No - copy requested     Would patient like information on creating a medical advance directive?     No - Patient declined    Current Medications (verified) Outpatient Encounter Medications as of 08/24/2023  Medication Sig   amLODipine (NORVASC) 10 MG tablet Take 1 tablet (10 mg total) by mouth daily.   cholecalciferol (VITAMIN D3) 25 MCG (1000 UNIT) tablet Take 1,000 Units by mouth daily.   irbesartan (AVAPRO) 75 MG tablet Take 1 tablet (75 mg total) by mouth daily.   rosuvastatin (CRESTOR) 20 MG tablet TAKE 1 TABLET BY MOUTH EVERY DAY   No facility-administered encounter medications on file as of 08/24/2023.     Allergies (verified) Lisinopril   History: Past Medical History:  Diagnosis Date   Chest pain    Dizziness    Hyperlipidemia    Hypertension    Idiopathic gout of foot 08/30/2020   Past Surgical History:  Procedure Laterality Date   BREAST SURGERY Left 1980   biopsy   Family History  Problem Relation Age of Onset   Heart disease Mother    Heart disease Father    Stroke Maternal Grandmother    Stroke Maternal Grandfather    Heart disease Paternal Grandmother    Heart disease Paternal Grandfather    Cancer Neg Hx    Diabetes Neg Hx    Social History   Socioeconomic History   Marital status: Married    Spouse name: Insurance account manager   Number of children: 1   Years of education: Not on file   Highest education level: Not on file  Occupational History   Occupation: retired  Tobacco Use   Smoking status: Former   Smokeless tobacco: Never   Tobacco comments:    quit 30 years ago  Vaping Use   Vaping status: Never Used  Substance and Sexual Activity   Alcohol use: Not Currently   Drug use: No   Sexual activity: Not Currently  Other Topics Concern   Not on file  Social History Narrative   05/23/22   From: originally from [place], then [place]    Living:  with husband Education officer, environmental) and grandson   Work: retired - book keeping      Family: one adopted daughter - Annabelle Harman      Enjoys: nothing currently      Exercise: not currently   Diet: avoids certain foods - seafood, red meat      Safety   Seat belts: Yes    Guns: No   Safe in relationships: Yes       Social Determinants of Corporate investment banker Strain: Low Risk  (08/24/2023)   Overall Financial Resource Strain (CARDIA)    Difficulty of Paying Living Expenses: Not hard at all  Food Insecurity: No Food Insecurity (08/24/2023)   Hunger Vital Sign    Worried About Running Out of Food in the Last Year: Never true    Ran Out of Food in the Last Year: Never true  Transportation Needs: No Transportation Needs (08/24/2023)    PRAPARE - Administrator, Civil Service (Medical): No    Lack of Transportation (Non-Medical): No  Physical Activity: Inactive (08/24/2023)   Exercise Vital Sign    Days of Exercise per Week: 0 days    Minutes of Exercise per Session: 0 min  Stress: No Stress Concern Present (08/24/2023)   [employer] of Occupational Health - Occupational Stress Questionnaire    Feeling of Stress : Not at all  Social Connections: Socially Isolated (08/24/2023)   Social Connection and Isolation Panel [NHANES]    Frequency of Communication with Friends and Family: Once a week    Frequency of Social Gatherings with Friends and Family: Once a week    Attends Religious Services: Never    Database administrator or Organizations: No    Attends Engineer, structural: Never    Marital Status: Married    Tobacco Counseling Counseling given: Not Answered Tobacco comments: quit 30 years ago   Clinical Intake:  Pre-visit preparation completed: Yes  Pain : No/denies pain     BMI - recorded: 21.74 Nutritional Status: BMI of 19-24  Normal Nutritional Risks: None Diabetes: No  How often do you need to have someone help you when you read instructions, pamphlets, or other written materials from your doctor or pharmacy?: 1 - Never  Interpreter Needed?: No  Information entered by :: R. Dawanda Mapel LPN   Activities of Daily Living    08/24/2023    2:20 PM  In your present state of health, do you have any difficulty performing the following activities:  Hearing? 0  Vision? 0  Comment glasses  Difficulty concentrating or making decisions? 1  Walking or climbing stairs? 0  Dressing or bathing? 0  Doing errands, shopping? 0  Preparing Food and eating ? N  Using the Toilet? N  In the past six months, have you accidently leaked urine? N  Do you have problems with loss of bowel control? N  Managing your Medications? N  Managing your Finances? N  Housekeeping or managing your  Housekeeping? N    Patient Care Team: Mort Sawyers, FNP as PCP - General (Family Medicine) Corky Crafts, [place] as Consulting Physician (Cardiology)  Indicate any recent Medical Services you may have received from other than [facility] providers in the past year (date may be approximate).     Assessment:   This is a routine wellness examination for Marzelle.  Hearing/Vision screen Hearing Screening - Comments:: No issues Vision Screening - Comments:: glasses   Goals Addressed  This Visit's Progress    Patient Stated       Wants to be able to continue to do her shopping and go out at times       Depression Screen    08/24/2023    2:24 PM 06/05/2022    1:48 PM 11/19/2021    9:37 AM 08/30/2020   10:54 AM 08/30/2019   12:09 PM 08/24/2018    8:16 AM 08/20/2017    8:14 AM  PHQ 2/9 Scores  PHQ - 2 Score 0 0 0 0 0 0 0  PHQ- 9 Score 0          Fall Risk    08/24/2023    2:22 PM 06/05/2022    1:51 PM 11/19/2021    9:37 AM 08/30/2020   10:54 AM 10/26/2019   10:46 AM  Fall Risk   Falls in the past year? 0 0 0 0 0  Comment     Emmi Telephone Survey: data to providers prior to load  Number falls in past yr: 0 0     Injury with Fall? 0 0     Risk for fall due to : No Fall Risks No Fall Risks     Follow up Falls prevention discussed;Falls evaluation completed        MEDICARE RISK AT HOME: Medicare Risk at Home Any stairs in or around the home?: No If so, are there any without handrails?: No Home free of loose throw rugs in walkways, pet beds, electrical cords, etc?: Yes Adequate lighting in your home to reduce risk of falls?: Yes Life alert?: No Use of a cane, walker or w/c?: No Grab bars in the bathroom?: No Shower chair or bench in shower?: Yes Elevated toilet seat or a handicapped toilet?: No    Cognitive Function:        08/24/2023    2:30 PM 06/05/2022    1:52 PM  6CIT Screen  What Year? 0 points 0 points  What month? 0 points 0 points  What time? 0  points 0 points  Count back from 20 4 points 0 points  Months in reverse 2 points 0 points  Repeat phrase 8 points 0 points  Total Score 14 points 0 points    Immunizations Immunization History  Administered Date(s) Administered   Fluad Quad(high Dose 65+) 11/19/2021, 10/02/2022   Influenza,inj,Quad PF,6+ Mos 08/30/2019   Moderna Sars-Covid-2 Vaccination 12/27/2019, 01/24/2020   Pneumococcal Polysaccharide-23 11/19/2021   Zoster Recombinant(Shingrix) 02/01/2021, 04/08/2021    TDAP status: Due, Education has been provided regarding the importance of this vaccine. Advised may receive this vaccine at local pharmacy or Health Dept. Aware to provide a copy of the vaccination record if obtained from local pharmacy or Health Dept. Verbalized acceptance and understanding.  Flu Vaccine status: Due, Education has been provided regarding the importance of this vaccine. Advised may receive this vaccine at local pharmacy or Health Dept. Aware to provide a copy of the vaccination record if obtained from local pharmacy or Health Dept. Verbalized acceptance and understanding.  Pneumococcal vaccine status: Due, Education has been provided regarding the importance of this vaccine. Advised may receive this vaccine at local pharmacy or Health Dept. Aware to provide a copy of the vaccination record if obtained from local pharmacy or Health Dept. Verbalized acceptance and understanding.  Covid-19 vaccine status: Information provided on how to obtain vaccines.   Qualifies for Shingles Vaccine? Yes   Zostavax completed No   Shingrix Completed?: Yes  Screening Tests  Health Maintenance  Topic Date Due   DTaP/Tdap/Td (1 - Tdap) Never done   DEXA SCAN  Never done   Pneumonia Vaccine 37+ Years old (2 of 2 - PCV) 11/19/2022   Medicare Annual Wellness (AWV)  06/06/2023   INFLUENZA VACCINE  07/02/2023   COVID-19 Vaccine (3 - 2023-24 season) 08/02/2023   Zoster Vaccines- Shingrix  Completed   HPV VACCINES   Aged Out    Health Maintenance  Health Maintenance Due  Topic Date Due   DTaP/Tdap/Td (1 - Tdap) Never done   DEXA SCAN  Never done   Pneumonia Vaccine 38+ Years old (2 of 2 - PCV) 11/19/2022   Medicare Annual Wellness (AWV)  06/06/2023   INFLUENZA VACCINE  07/02/2023   COVID-19 Vaccine (3 - 2023-24 season) 08/02/2023    Colorectal cancer screening: No longer required.   Mammogram status: No longer required due to age.  Bone Density status patient declines  Lung Cancer Screening: (Low Dose CT Chest recommended if Age 66-80 years, 20 pack-year currently smoking OR have quit w/in 15years.) does not qualify.     Additional Screening:  Hepatitis C Screening: does not qualify; Completed NA age  Vision Screening: Recommended annual ophthalmology exams for early detection of glaucoma and other disorders of the eye. Is the patient up to date with their annual eye exam?  No  Who is the provider or what is the name of the office in which the patient attends annual eye exams? Patient is not sure where she went last time. Patient will work on scheduling an appointment If pt is not established with a provider, would they like to be referred to a provider to establish care? No .   Dental Screening: Recommended annual dental exams for proper oral hygiene   Community Resource Referral / Chronic Care Management: CRR required this visit?  No   CCM required this visit?  No     Plan:     I have personally reviewed and noted the following in the patient's chart:   Medical and social history Use of alcohol, tobacco or illicit drugs  Current medications and supplements including opioid prescriptions. Patient is not currently taking opioid prescriptions. Functional ability and status Nutritional status Physical activity Advanced directives List of other physicians Hospitalizations, surgeries, and ER visits in previous 12 months Vitals Screenings to include cognitive, depression,  and falls Referrals and appointments  In addition, I have reviewed and discussed with patient certain preventive protocols, quality metrics, and best practice recommendations. A written personalized care plan for preventive services as well as general preventive health recommendations were provided to patient.     Sydell Axon, LPN   07/11/9146   After Visit Summary: (In Person-Declined) Patient declined AVS at this time.  Nurse Notes: None

## 2023-08-26 ENCOUNTER — Other Ambulatory Visit: Payer: Self-pay | Admitting: Interventional Cardiology

## 2023-08-31 NOTE — Progress Notes (Unsigned)
Cardiology Office Note    Date:  08/31/2023  ID:  Haley Randall, DOB [birth year], MRN [id number] PCP:  Mort Sawyers, FNP  Cardiologist: Previously Dr. Eldridge Dace Electrophysiologist:  None   Chief Complaint: ***  History of Present Illness: .    Haley Randall is a 87 y.o. female with visit-pertinent history of HTN with h/o orthostatic hypotension, HLD managed by PCP, abnormal EKG, CKD stage 3b, carotid artery disease, mild MR, gout seen for follow-up. She was evaluated by Dr. Eldridge Dace then pharmD for HTN in the context of some orthostatic hypotension. Hydrochlorothiazide had to be stopped for this. Echo done for abnormal EKG and chest pain with emotional upset 08/2022 showed EF 60-65%,mild asymmetric left ventricular hypertrophy of the basal segment, mild MR. Dr. Eldridge Dace recommended to reserve additional ischemic testing given age and renal insufficiency, medical management suggested. Carotid duplex 08/2023 RICA 60-79%, 40-59% LICA, >50% BECA.  Hypok on 08/2023 labs???   HTN with history of orthostasis Carotid artery disease Mild LVH Mild MR  Labwork independently reviewed: 08/2023 K  3.4, Cr 1.65 c/w prior 10/2022 LFTs ok, LDL 44, trig 67 05/2022 TSH wnl  ROS: .    Please see the history of present illness. Otherwise, review of systems is positive for ***.  All other systems are reviewed and otherwise negative.  Studies Reviewed: Marland Kitchen    EKG:  EKG is ordered today, personally reviewed, demonstrating ***  CV Studies: Cardiac studies reviewed are outlined and summarized above. Otherwise please see EMR for full report.   Current Reported Medications:.    No outpatient medications have been marked as taking for the 09/01/23 encounter (Appointment) with Laurann Montana, PA-C.    Physical Exam:    VS:  There were no vitals taken for this visit.   Wt Readings from Last 3 Encounters:  08/24/23 104 lb (47.2 kg)  08/05/23 104 lb 9.6 oz (47.4 kg)  10/02/22 107 lb (48.5 kg)    GEN: Well  nourished, well developed in no acute distress NECK: No JVD; No carotid bruits CARDIAC: ***RRR, no murmurs, rubs, gallops RESPIRATORY:  Clear to auscultation without rales, wheezing or rhonchi  ABDOMEN: Soft, non-tender, non-distended EXTREMITIES:  No edema; No acute deformity   Asessement and Plan:.     ***     Disposition: F/u with ***  Signed, Laurann Montana, PA-C

## 2023-09-01 ENCOUNTER — Ambulatory Visit: Payer: Medicare Other | Attending: Physician Assistant | Admitting: Physician Assistant

## 2023-09-01 ENCOUNTER — Encounter: Payer: Self-pay | Admitting: Physician Assistant

## 2023-09-01 VITALS — BP 138/54 | HR 79 | Ht <= 58 in | Wt 102.0 lb

## 2023-09-01 DIAGNOSIS — I951 Orthostatic hypotension: Secondary | ICD-10-CM | POA: Insufficient documentation

## 2023-09-01 DIAGNOSIS — I340 Nonrheumatic mitral (valve) insufficiency: Secondary | ICD-10-CM | POA: Diagnosis not present

## 2023-09-01 DIAGNOSIS — I491 Atrial premature depolarization: Secondary | ICD-10-CM | POA: Diagnosis not present

## 2023-09-01 DIAGNOSIS — I517 Cardiomegaly: Secondary | ICD-10-CM | POA: Insufficient documentation

## 2023-09-01 DIAGNOSIS — I6523 Occlusion and stenosis of bilateral carotid arteries: Secondary | ICD-10-CM | POA: Diagnosis not present

## 2023-09-01 DIAGNOSIS — I10 Essential (primary) hypertension: Secondary | ICD-10-CM | POA: Diagnosis not present

## 2023-09-01 MED ORDER — ROSUVASTATIN CALCIUM 20 MG PO TABS: 20.0000 mg | ORAL_TABLET | Freq: Every day | ORAL | 3 refills | Status: DC

## 2023-09-01 MED ORDER — ASPIRIN 81 MG PO TBEC: 81.0000 mg | DELAYED_RELEASE_TABLET | Freq: Every day | ORAL | Status: DC

## 2023-09-01 MED ORDER — IRBESARTAN 75 MG PO TABS: 75.0000 mg | ORAL_TABLET | Freq: Every day | ORAL | 3 refills | Status: DC

## 2023-09-01 MED ORDER — AMLODIPINE BESYLATE 10 MG PO TABS: 10.0000 mg | ORAL_TABLET | Freq: Every day | ORAL | 3 refills | Status: DC

## 2023-09-01 NOTE — Patient Instructions (Signed)
Medication Instructions:  Your physician has recommended you make the following change in your medication:  1-START Aspirin 81 mg by mouth daily.  *If you need a refill on your cardiac medications before your next appointment, please call your pharmacy*   Lab Work: Your physician recommends that you have lab work today- CMET, Mg, lipids, CBC, and TSH  If you have labs (blood work) drawn today and your tests are completely normal, you will receive your results only by: MyChart Message (if you have MyChart) OR A paper copy in the mail If you have any lab test that is abnormal or we need to change your treatment, we will call you to review the results.  Testing/Procedures: Your physician has requested that you have a carotid duplex in September 2025. This test is an ultrasound of the carotid arteries in your neck. It looks at blood flow through these arteries that supply the brain with blood. Allow one hour for this exam. There are no restrictions or special instructions.   Follow-Up: At [facility], you and your health needs are our priority.  As part of our continuing mission to provide you with exceptional heart care, we have created designated Provider Care Teams.  These Care Teams include your primary Cardiologist (physician) and Advanced Practice Providers (APPs -  Physician Assistants and Nurse Practitioners) who all work together to provide you with the care you need, when you need it.  We recommend signing up for the patient portal called "MyChart".  Sign up information is provided on this After Visit Summary.  MyChart is used to connect with patients for Virtual Visits (Telemedicine).  Patients are able to view lab/test results, encounter notes, upcoming appointments, etc.  Non-urgent messages can be sent to your provider as well.   To learn more about what you can do with MyChart, go to [email or web address].    Your next appointment:   1 year(s) to establish care  with  Provider:   Christell Constant, MD

## 2023-09-02 ENCOUNTER — Other Ambulatory Visit (HOSPITAL_COMMUNITY): Payer: Self-pay

## 2023-09-02 ENCOUNTER — Telehealth: Payer: Self-pay | Admitting: *Deleted

## 2023-09-02 LAB — COMPREHENSIVE METABOLIC PANEL WITH GFR
ALT: 23 [IU]/L (ref 0–32)
AST: 36 [IU]/L (ref 0–40)
Albumin: 4.4 g/dL (ref 3.7–4.7)
Alkaline Phosphatase: 89 [IU]/L (ref 44–121)
BUN/Creatinine Ratio: 8 — ABNORMAL LOW (ref 12–28)
BUN: 17 mg/dL (ref 8–27)
Bilirubin Total: 0.8 mg/dL (ref 0.0–1.2)
CO2: 25 mmol/L (ref 20–29)
Calcium: 10.4 mg/dL — ABNORMAL HIGH (ref 8.7–10.3)
Chloride: 107 mmol/L — ABNORMAL HIGH (ref 96–106)
Creatinine, Ser: 2.08 mg/dL — ABNORMAL HIGH (ref 0.57–1.00)
Globulin, Total: 2.6 g/dL (ref 1.5–4.5)
Glucose: 90 mg/dL (ref 70–99)
Potassium: 3.8 mmol/L (ref 3.5–5.2)
Sodium: 146 mmol/L — ABNORMAL HIGH (ref 134–144)
Total Protein: 7 g/dL (ref 6.0–8.5)
eGFR: 23 mL/min/{1.73_m2} — ABNORMAL LOW

## 2023-09-02 LAB — CBC WITH DIFFERENTIAL/PLATELET
Basophils Absolute: 0 10*3/uL (ref 0.0–0.2)
Basos: 1 %
EOS (ABSOLUTE): 0.1 10*3/uL (ref 0.0–0.4)
Eos: 2 %
Hematocrit: 35.8 % (ref 34.0–46.6)
Hemoglobin: 11.1 g/dL (ref 11.1–15.9)
Immature Grans (Abs): 0 10*3/uL (ref 0.0–0.1)
Immature Granulocytes: 0 %
Lymphocytes Absolute: 1.3 10*3/uL (ref 0.7–3.1)
Lymphs: 27 %
MCH: 27.1 pg (ref 26.6–33.0)
MCHC: 31 g/dL — ABNORMAL LOW (ref 31.5–35.7)
MCV: 87 fL (ref 79–97)
Monocytes Absolute: 0.4 10*3/uL (ref 0.1–0.9)
Monocytes: 8 %
Neutrophils Absolute: 2.9 10*3/uL (ref 1.4–7.0)
Neutrophils: 62 %
Platelets: 196 10*3/uL (ref 150–450)
RBC: 4.1 x10E6/uL (ref 3.77–5.28)
RDW: 13.1 % (ref 11.7–15.4)
WBC: 4.6 10*3/uL (ref 3.4–10.8)

## 2023-09-02 LAB — LIPID PANEL
Chol/HDL Ratio: 1.8 ratio (ref 0.0–4.4)
Cholesterol, Total: 157 mg/dL (ref 100–199)
HDL: 86 mg/dL
LDL Chol Calc (NIH): 57 mg/dL (ref 0–99)
Triglycerides: 74 mg/dL (ref 0–149)
VLDL Cholesterol Cal: 14 mg/dL (ref 5–40)

## 2023-09-02 LAB — TSH: TSH: 3.83 u[IU]/mL (ref 0.450–4.500)

## 2023-09-02 LAB — MAGNESIUM: Magnesium: 2.2 mg/dL (ref 1.6–2.3)

## 2023-09-02 NOTE — Telephone Encounter (Signed)
Left message to call office

## 2023-09-02 NOTE — Telephone Encounter (Signed)
-----   Message from Laurann Montana sent at 09/02/2023  8:15 AM EDT ----- Please let pt know kidney function shows further decline, unclear what provoked this. [Calculated CrCl is now 68ml/min for age/weight]. This is concerning and needs further monitoring.  Recommendations:  - Stop irbesartan for now (We will have to see what her blood pressure does with this change but for now I will hold off any acute substitutions. Thinking ahead for myself: could consider low dose carvedilol in follow-up) - Aim to drink at least 64 oz of water per day - she was reporting she does not drink much in general - Recheck NONFASTING BMET on Monday 10/7 WITH Myeloma Panel at that time - Her kidney function is now low enough that we need to change rosuvastatin as well. D/c rosuvastatin. Start atorvastatin 40mg  daily. Recheck CMET/fasting lipids 3 months. (Note: we just sent in refills of irbesartan and rosuvastatin yesterday - please CANCEL these rx's) - Avoid any meds in NSAID class like ibuprofen, motrin, Aleve, naproxen if taking OTC - Will also cc results to endocrinology as they have been following hypercalcemia - Endocrinology also referred patient for nephrology, please request to expedite this referral - Schedule f/u with me 10/2023

## 2023-09-03 ENCOUNTER — Other Ambulatory Visit (HOSPITAL_COMMUNITY): Payer: Self-pay

## 2023-09-04 ENCOUNTER — Other Ambulatory Visit: Payer: Self-pay | Admitting: *Deleted

## 2023-09-04 DIAGNOSIS — N1831 Chronic kidney disease, stage 3a: Secondary | ICD-10-CM

## 2023-09-04 DIAGNOSIS — E7800 Pure hypercholesterolemia, unspecified: Secondary | ICD-10-CM

## 2023-09-04 MED ORDER — ATORVASTATIN CALCIUM 40 MG PO TABS: 40.0000 mg | ORAL_TABLET | Freq: Every day | ORAL | 3 refills | Status: DC

## 2023-09-07 ENCOUNTER — Ambulatory Visit: Payer: Medicare Other | Attending: Internal Medicine

## 2023-09-07 ENCOUNTER — Other Ambulatory Visit (HOSPITAL_COMMUNITY): Payer: Self-pay

## 2023-09-07 DIAGNOSIS — N1831 Chronic kidney disease, stage 3a: Secondary | ICD-10-CM | POA: Diagnosis not present

## 2023-09-07 NOTE — Telephone Encounter (Signed)
Patient wanted to know what medications she is suppose to be on. She was here for lab work, so gave her a copy of her medication list. Informed her to stop irbesartan and rosuvastatin, then start atorvastatin. All appointments have already been made by nurse that gave patient her results on Friday, 09/04/23.

## 2023-09-08 ENCOUNTER — Telehealth: Payer: Self-pay

## 2023-09-08 ENCOUNTER — Other Ambulatory Visit (HOSPITAL_COMMUNITY): Payer: Self-pay

## 2023-09-08 LAB — BASIC METABOLIC PANEL
BUN/Creatinine Ratio: 9 — ABNORMAL LOW (ref 12–28)
BUN: 15 mg/dL (ref 8–27)
CO2: 25 mmol/L (ref 20–29)
Calcium: 10.3 mg/dL (ref 8.7–10.3)
Chloride: 103 mmol/L (ref 96–106)
Creatinine, Ser: 1.68 mg/dL — ABNORMAL HIGH (ref 0.57–1.00)
Glucose: 93 mg/dL (ref 70–99)
Potassium: 4 mmol/L (ref 3.5–5.2)
Sodium: 143 mmol/L (ref 134–144)
eGFR: 30 mL/min/{1.73_m2} — ABNORMAL LOW (ref >59)

## 2023-09-08 MED ORDER — IRBESARTAN 75 MG PO TABS: 75.0000 mg | ORAL_TABLET | Freq: Every day | ORAL | 3 refills | Status: DC

## 2023-09-08 NOTE — Telephone Encounter (Signed)
-----   Message from Laurann Montana sent at 09/08/2023 10:15 AM EDT ----- Please let pt know that creatinine back down closer to baseline off irbesartan, keep off for now. Per last phone note, endocrinology had referred to nephrology. Notify if BPs consistently running >140 systolic at home off irbesartan.  Otherwise continue plan as discussed.

## 2023-09-08 NOTE — Telephone Encounter (Signed)
Spoke with patient and she is aware of lab results and provider recommendations. Verbalized understanding.

## 2023-10-05 ENCOUNTER — Ambulatory Visit: Payer: Medicare Other | Admitting: Interventional Cardiology

## 2023-10-06 ENCOUNTER — Ambulatory Visit: Payer: Medicare Other | Admitting: Physician Assistant

## 2023-10-08 ENCOUNTER — Ambulatory Visit: Payer: Medicare Other | Admitting: Physician Assistant

## 2023-10-23 NOTE — Progress Notes (Unsigned)
Cardiology Office Note:  .   Date:  10/26/2023  ID:  Haley Randall, DOB [birth year], MRN [id number] PCP: Mort Sawyers, FNP  [facility] Providers Cardiologist:  Christell Constant, MD    Patient Profile: .      PMH Hypertension Orthostatic hypotension Hyperlipidemia CKD Stage 3b Carotid artery disease Carotid duplex 08/2023 R ICA 60 to 79% LICA 40 to 59% >50% BECA Mitral regurgitation Gout  She was referred to cardiology for evaluation of orthostatic hypotension and seen by Dr. Eldridge Dace 08/01/2022.  Echocardiogram was ordered for abnormal EKG and revealed EF 60 to 65%, mild asymmetric left ventricular hypertrophy of the basal segment, and mild MR.  She reported drinking only 1 glass of water daily.  Her hypertension has been treated with amlodipine 10 mg and hydrochlorothiazide 12.5 mg.  She had stopped the hydrochlorothiazide and was feeling better. She reported chest discomfort with emotional stress, not related to exertion.  Pain improves with relaxation.  She is stressed as the caregiver for her husband who has dementia and he is difficult to care for.  He fell the day prior and broke some ribs.  Her walking is limited by leg pain.  She is active doing housework and caring for her husband but no dedicated exercise.  BP was elevated in the office to 160s when lying down in 130s when standing.  Carvedilol 3.125 mg twice daily was added for BP control as well as chest discomfort.  She was seen in follow-up by Malena Peer, RPH at which time she reported she did not start the carvedilol. She was only taking amlodipine and rosuvastatin.  She denied chest pain.  No further symptoms of orthostatic hypotension.  BP was 150/60 with no change on repeat BP check.  Plan to start irbesartan 75 mg daily, continue amlodipine 10 mg daily, and aim to walk for 10 minutes 3 times a week.  She was encouraged to check BP 1-2 times daily and bring her home machine to next visit.  At follow-up visit  BP improved but was still not at goal.  It was thought her home technique was not appropriate so proper technique was reviewed and no medication changes were made.She was also encouraged to eat nutritious meals rather than snacking on sweets.   Last cardiology clinic visit was 09/01/2023 with Dayna Dunn, PA.  BP was 138/54 mmHg.  EKG revealed occasional PACs. She did have some reduction in BP with standing but not significant. She admitted symptoms were worse when she did not eat well.  She was encouraged to consume nutritious food and hydrate well.  Consideration given to referral to neurology to evaluate for intracranial vascular disease but she declined.  She was encouraged to restart aspirin 81 mg daily due to carotid artery disease.  Lab work revealed significant decline in kidney function and was advised to stop irbesartan, aim to get 64 ounces of water daily, avoid nephrotoxic medications, and results forwarded to endocrinology as they have been following her hypercalcemia. Repeat BMP 09/07/23 revealed kidney function returned to normal and she was referred to nephrology. Advised to notify us if home BP consistently > 140 systolic.        History of Present Illness: .   Haley Randall is a very pleasant 87 y.o. female today for follow-up. She reports feeling well overall, with no dizziness or orthostatic symptoms. She denies chest pain, shortness of breath, palpitations, or syncope. She has been adhering to her medication regimen, which includes atorvastatin, vitamin D,  and amlodipine. She discontinued irbesartan as directed due to concerns about kidney function. She admits to poor dietary habits, including infrequent meals and inadequate water intake. She expresses a preference for fast food and convenience meals, but also consumes some vegetables and protein sources like chicken and peanut butter. She has dietary restrictions, avoiding fish and beef, and often opts for vegetable-based meals. Is dealing  with caring for her husband who is currently in rehab. Despite these challenges, she maintains her independence, driving herself to and from the rehab facility. She does not get any regular exercise.  No concerning symptoms of orthostatic hypotension.  Discussed the use of AI scribe software for clinical note transcription with the patient, who gave verbal consent to proceed.   ROS: See HPI       Studies Reviewed: .        Risk Assessment/Calculations:             Physical Exam:   VS:  BP (!) 138/58   Pulse 63   Ht 4\' 11"  (1.499 m)   Wt 104 lb 3.2 oz (47.3 kg)   SpO2 99%   BMI 21.05 kg/m    Wt Readings from Last 3 Encounters:  10/26/23 104 lb 3.2 oz (47.3 kg)  09/01/23 102 lb (46.3 kg)  08/24/23 104 lb (47.2 kg)    GEN: Well nourished, well developed in no acute distress NECK: No JVD; No carotid bruits CARDIAC: RRR, no murmurs, rubs, gallops RESPIRATORY:  Clear to auscultation without rales, wheezing or rhonchi  ABDOMEN: Soft, non-tender, non-distended EXTREMITIES:  No edema; No deformity     ASSESSMENT AND PLAN: .    CKD Stage 3: Increase in creatinine from 1.65-2.08 on irbesartan which was subsequently discontinued.  She had improvement 1 week later with SCR 1.68. She has been referred to nephrology and has an appointment in 2 days. She also has history of hypercalcemia.   Hypertension/Orthostatic hypotension: BP is stable.  Irbesartan was discontinued due to worsening kidney function.  She does not endorse any concerning symptoms of hypotension. She continues to eat infrequently and admits to poor hydration.  I again emphasized the importance of regular hydration and nutrition. We will continue amlodipine 10 mg daily. Is scheduled to see nephrology later this week. No additional changes at this time.   PACs: EKG tends last 1/24 revealed frequent PACs. She is asymptomatic.  No evidence of abnormal thyroid function or electrolyte disturbance that may be contributing. She is  not on beta-blocker due to history of orthostasis.  Mitral regurgitation: Mild mitral regurgitation on echo 08/2022 with normal LVEF.  She is asymptomatic.  We will continue to follow clinically for now.  Carotid artery disease: Moderate carotid artery disease 60-79% right ICA and 40-59% Left ICA with > 50% stenoses bilateral ECA on carotid duplex 08/19/23. She is asymptomatic. Recommendation to repeat duplex in one year which is ordered for 08/2024.  Continue aspirin and atorvastatin.       Dispo: 1 year with Dr. Izora Ribas  Signed, Eligha Bridegroom, NP-C

## 2023-10-26 ENCOUNTER — Encounter: Payer: Self-pay | Admitting: Nurse Practitioner

## 2023-10-26 ENCOUNTER — Ambulatory Visit: Payer: Medicare Other | Attending: Nurse Practitioner | Admitting: Nurse Practitioner

## 2023-10-26 VITALS — BP 138/58 | HR 63 | Ht 59.0 in | Wt 104.2 lb

## 2023-10-26 DIAGNOSIS — I779 Disorder of arteries and arterioles, unspecified: Secondary | ICD-10-CM | POA: Diagnosis not present

## 2023-10-26 DIAGNOSIS — I491 Atrial premature depolarization: Secondary | ICD-10-CM | POA: Insufficient documentation

## 2023-10-26 DIAGNOSIS — N1831 Chronic kidney disease, stage 3a: Secondary | ICD-10-CM | POA: Insufficient documentation

## 2023-10-26 DIAGNOSIS — I10 Essential (primary) hypertension: Secondary | ICD-10-CM | POA: Insufficient documentation

## 2023-10-26 DIAGNOSIS — I340 Nonrheumatic mitral (valve) insufficiency: Secondary | ICD-10-CM | POA: Insufficient documentation

## 2023-10-26 DIAGNOSIS — I951 Orthostatic hypotension: Secondary | ICD-10-CM | POA: Diagnosis not present

## 2023-10-26 NOTE — Patient Instructions (Signed)
Medication Instructions:   Your physician recommends that you continue on your current medications as directed. Please refer to the Current Medication list given to you today.   *If you need a refill on your cardiac medications before your next appointment, please call your pharmacy*   Lab Work:  None ordered.  If you have labs (blood work) drawn today and your tests are completely normal, you will receive your results only by: MyChart Message (if you have MyChart) OR A paper copy in the mail If you have any lab test that is abnormal or we need to change your treatment, we will call you to review the results.   Testing/Procedures:  None ordered.   Follow-Up: At [facility], you and your health needs are our priority.  As part of our continuing mission to provide you with exceptional heart care, we have created designated Provider Care Teams.  These Care Teams include your primary Cardiologist (physician) and Advanced Practice Providers (APPs -  Physician Assistants and Nurse Practitioners) who all work together to provide you with the care you need, when you need it.  We recommend signing up for the patient portal called "MyChart".  Sign up information is provided on this After Visit Summary.  MyChart is used to connect with patients for Virtual Visits (Telemedicine).  Patients are able to view lab/test results, encounter notes, upcoming appointments, etc.  Non-urgent messages can be sent to your provider as well.   To learn more about what you can do with MyChart, go to [email or web address].    Your next appointment:   1 year(s)  Provider:   Christell Constant, MD     Other Instructions  Your physician wants you to follow-up in: 1 year.  You will receive a reminder letter in the mail two months in advance. If you don't receive a letter, please call our office to schedule the follow-up appointment.

## 2023-10-28 ENCOUNTER — Other Ambulatory Visit: Payer: Self-pay | Admitting: Nephrology

## 2023-10-28 DIAGNOSIS — I129 Hypertensive chronic kidney disease with stage 1 through stage 4 chronic kidney disease, or unspecified chronic kidney disease: Secondary | ICD-10-CM | POA: Diagnosis not present

## 2023-10-28 DIAGNOSIS — N1832 Chronic kidney disease, stage 3b: Secondary | ICD-10-CM | POA: Diagnosis not present

## 2023-10-28 DIAGNOSIS — N2581 Secondary hyperparathyroidism of renal origin: Secondary | ICD-10-CM | POA: Diagnosis not present

## 2023-10-28 DIAGNOSIS — D631 Anemia in chronic kidney disease: Secondary | ICD-10-CM | POA: Diagnosis not present

## 2023-11-01 LAB — LAB REPORT - SCANNED: EGFR: 36

## 2023-11-02 ENCOUNTER — Other Ambulatory Visit: Payer: Medicare Other

## 2023-11-02 DIAGNOSIS — Z23 Encounter for immunization: Secondary | ICD-10-CM | POA: Diagnosis not present

## 2023-11-06 NOTE — Progress Notes (Signed)
noted 

## 2023-12-04 ENCOUNTER — Other Ambulatory Visit: Payer: Medicare Other

## 2024-02-02 ENCOUNTER — Ambulatory Visit: Payer: Medicare Other | Admitting: Internal Medicine

## 2024-08-09 DIAGNOSIS — Z23 Encounter for immunization: Secondary | ICD-10-CM | POA: Diagnosis not present

## 2024-08-19 ENCOUNTER — Encounter (HOSPITAL_COMMUNITY): Payer: Medicare Other

## 2024-10-13 ENCOUNTER — Emergency Department (HOSPITAL_COMMUNITY)

## 2024-10-13 ENCOUNTER — Encounter (HOSPITAL_COMMUNITY): Payer: Self-pay

## 2024-10-13 ENCOUNTER — Other Ambulatory Visit: Payer: Self-pay

## 2024-10-13 ENCOUNTER — Inpatient Hospital Stay (HOSPITAL_COMMUNITY)
Admission: EM | Admit: 2024-10-13 | Discharge: 2024-10-31 | DRG: 438 | Disposition: A | Discharge status: Home / Self Care | Attending: Internal Medicine | Admitting: Internal Medicine

## 2024-10-13 DIAGNOSIS — Z91128 Patient's intentional underdosing of medication regimen for other reason: Secondary | ICD-10-CM

## 2024-10-13 DIAGNOSIS — K651 Peritoneal abscess: Secondary | ICD-10-CM | POA: Diagnosis present

## 2024-10-13 DIAGNOSIS — R7989 Other specified abnormal findings of blood chemistry: Secondary | ICD-10-CM | POA: Diagnosis present

## 2024-10-13 DIAGNOSIS — M109 Gout, unspecified: Secondary | ICD-10-CM | POA: Diagnosis present

## 2024-10-13 DIAGNOSIS — N179 Acute kidney failure, unspecified: Secondary | ICD-10-CM | POA: Diagnosis present

## 2024-10-13 DIAGNOSIS — E785 Hyperlipidemia, unspecified: Secondary | ICD-10-CM | POA: Diagnosis present

## 2024-10-13 DIAGNOSIS — I10 Essential (primary) hypertension: Secondary | ICD-10-CM | POA: Diagnosis not present

## 2024-10-13 DIAGNOSIS — Z87891 Personal history of nicotine dependence: Secondary | ICD-10-CM

## 2024-10-13 DIAGNOSIS — F03A0 Unspecified dementia, mild, without behavioral disturbance, psychotic disturbance, mood disturbance, and anxiety: Secondary | ICD-10-CM | POA: Diagnosis present

## 2024-10-13 DIAGNOSIS — N1832 Chronic kidney disease, stage 3b: Secondary | ICD-10-CM | POA: Diagnosis present

## 2024-10-13 DIAGNOSIS — G9341 Metabolic encephalopathy: Secondary | ICD-10-CM | POA: Diagnosis present

## 2024-10-13 DIAGNOSIS — K529 Noninfective gastroenteritis and colitis, unspecified: Secondary | ICD-10-CM | POA: Diagnosis present

## 2024-10-13 DIAGNOSIS — D631 Anemia in chronic kidney disease: Secondary | ICD-10-CM | POA: Diagnosis present

## 2024-10-13 DIAGNOSIS — E440 Moderate protein-calorie malnutrition: Secondary | ICD-10-CM | POA: Diagnosis present

## 2024-10-13 DIAGNOSIS — K769 Liver disease, unspecified: Secondary | ICD-10-CM | POA: Diagnosis present

## 2024-10-13 DIAGNOSIS — D72829 Elevated white blood cell count, unspecified: Secondary | ICD-10-CM | POA: Diagnosis present

## 2024-10-13 DIAGNOSIS — K859 Acute pancreatitis without necrosis or infection, unspecified: Secondary | ICD-10-CM | POA: Diagnosis present

## 2024-10-13 DIAGNOSIS — E872 Acidosis, unspecified: Secondary | ICD-10-CM | POA: Diagnosis present

## 2024-10-13 DIAGNOSIS — R1013 Epigastric pain: Secondary | ICD-10-CM | POA: Diagnosis not present

## 2024-10-13 DIAGNOSIS — K7689 Other specified diseases of liver: Secondary | ICD-10-CM | POA: Diagnosis not present

## 2024-10-13 DIAGNOSIS — Z7982 Long term (current) use of aspirin: Secondary | ICD-10-CM

## 2024-10-13 DIAGNOSIS — Z0389 Encounter for observation for other suspected diseases and conditions ruled out: Secondary | ICD-10-CM | POA: Diagnosis not present

## 2024-10-13 DIAGNOSIS — Z79899 Other long term (current) drug therapy: Secondary | ICD-10-CM

## 2024-10-13 DIAGNOSIS — Z1152 Encounter for screening for COVID-19: Secondary | ICD-10-CM | POA: Diagnosis not present

## 2024-10-13 DIAGNOSIS — I129 Hypertensive chronic kidney disease with stage 1 through stage 4 chronic kidney disease, or unspecified chronic kidney disease: Secondary | ICD-10-CM | POA: Diagnosis present

## 2024-10-13 DIAGNOSIS — N281 Cyst of kidney, acquired: Secondary | ICD-10-CM | POA: Diagnosis not present

## 2024-10-13 DIAGNOSIS — J90 Pleural effusion, not elsewhere classified: Secondary | ICD-10-CM | POA: Diagnosis present

## 2024-10-13 DIAGNOSIS — R0902 Hypoxemia: Secondary | ICD-10-CM | POA: Diagnosis not present

## 2024-10-13 DIAGNOSIS — R7401 Elevation of levels of liver transaminase levels: Principal | ICD-10-CM

## 2024-10-13 DIAGNOSIS — R918 Other nonspecific abnormal finding of lung field: Secondary | ICD-10-CM | POA: Diagnosis not present

## 2024-10-13 DIAGNOSIS — Z8249 Family history of ischemic heart disease and other diseases of the circulatory system: Secondary | ICD-10-CM

## 2024-10-13 DIAGNOSIS — K828 Other specified diseases of gallbladder: Secondary | ICD-10-CM | POA: Diagnosis present

## 2024-10-13 DIAGNOSIS — R1084 Generalized abdominal pain: Secondary | ICD-10-CM | POA: Diagnosis not present

## 2024-10-13 DIAGNOSIS — K838 Other specified diseases of biliary tract: Secondary | ICD-10-CM | POA: Diagnosis not present

## 2024-10-13 DIAGNOSIS — R000 Tachycardia, unspecified: Secondary | ICD-10-CM | POA: Diagnosis not present

## 2024-10-13 DIAGNOSIS — K858 Other acute pancreatitis without necrosis or infection: Secondary | ICD-10-CM | POA: Diagnosis not present

## 2024-10-13 DIAGNOSIS — R001 Bradycardia, unspecified: Secondary | ICD-10-CM | POA: Diagnosis not present

## 2024-10-13 DIAGNOSIS — E876 Hypokalemia: Secondary | ICD-10-CM | POA: Diagnosis present

## 2024-10-13 DIAGNOSIS — K5730 Diverticulosis of large intestine without perforation or abscess without bleeding: Secondary | ICD-10-CM | POA: Diagnosis not present

## 2024-10-13 DIAGNOSIS — R160 Hepatomegaly, not elsewhere classified: Secondary | ICD-10-CM | POA: Diagnosis not present

## 2024-10-13 DIAGNOSIS — E0781 Sick-euthyroid syndrome: Secondary | ICD-10-CM | POA: Diagnosis present

## 2024-10-13 DIAGNOSIS — R188 Other ascites: Secondary | ICD-10-CM | POA: Diagnosis not present

## 2024-10-13 DIAGNOSIS — R935 Abnormal findings on diagnostic imaging of other abdominal regions, including retroperitoneum: Secondary | ICD-10-CM | POA: Diagnosis not present

## 2024-10-13 DIAGNOSIS — R109 Unspecified abdominal pain: Secondary | ICD-10-CM

## 2024-10-13 DIAGNOSIS — R112 Nausea with vomiting, unspecified: Secondary | ICD-10-CM | POA: Diagnosis not present

## 2024-10-13 DIAGNOSIS — R1011 Right upper quadrant pain: Secondary | ICD-10-CM | POA: Diagnosis not present

## 2024-10-13 DIAGNOSIS — Z888 Allergy status to other drugs, medicaments and biological substances status: Secondary | ICD-10-CM

## 2024-10-13 DIAGNOSIS — Z48813 Encounter for surgical aftercare following surgery on the respiratory system: Secondary | ICD-10-CM | POA: Diagnosis not present

## 2024-10-13 DIAGNOSIS — K6819 Other retroperitoneal abscess: Secondary | ICD-10-CM | POA: Diagnosis not present

## 2024-10-13 DIAGNOSIS — Z6825 Body mass index (BMI) 25.0-25.9, adult: Secondary | ICD-10-CM

## 2024-10-13 DIAGNOSIS — I700 Atherosclerosis of aorta: Secondary | ICD-10-CM | POA: Diagnosis not present

## 2024-10-13 LAB — URINALYSIS, W/ REFLEX TO CULTURE (INFECTION SUSPECTED)
Bilirubin Urine: NEGATIVE
Glucose, UA: 50 mg/dL — AB
Ketones, ur: NEGATIVE mg/dL
Nitrite: NEGATIVE
Protein, ur: NEGATIVE mg/dL
Specific Gravity, Urine: 1.01 (ref 1.005–1.030)
pH: 6 (ref 5.0–8.0)

## 2024-10-13 LAB — COMPREHENSIVE METABOLIC PANEL WITH GFR
ALT: 102 U/L — ABNORMAL HIGH (ref 0–44)
AST: 293 U/L — ABNORMAL HIGH (ref 15–41)
Albumin: 3.5 g/dL (ref 3.5–5.0)
Alkaline Phosphatase: 88 U/L (ref 38–126)
Anion gap: 13 (ref 5–15)
BUN: 18 mg/dL (ref 8–23)
CO2: 19 mmol/L — ABNORMAL LOW (ref 22–32)
Calcium: 9.4 mg/dL (ref 8.9–10.3)
Chloride: 108 mmol/L (ref 98–111)
Creatinine, Ser: 1.44 mg/dL — ABNORMAL HIGH (ref 0.44–1.00)
GFR, Estimated: 35 mL/min — ABNORMAL LOW (ref >60)
Glucose, Bld: 201 mg/dL — ABNORMAL HIGH (ref 70–99)
Potassium: 3.9 mmol/L (ref 3.5–5.1)
Sodium: 140 mmol/L (ref 135–145)
Total Bilirubin: 1.7 mg/dL — ABNORMAL HIGH (ref 0.0–1.2)
Total Protein: 6.6 g/dL (ref 6.5–8.1)

## 2024-10-13 LAB — CBC WITH DIFFERENTIAL/PLATELET
Abs Immature Granulocytes: 0.07 K/uL (ref 0.00–0.07)
Basophils Absolute: 0 K/uL (ref 0.0–0.1)
Basophils Relative: 0 %
Eosinophils Absolute: 0 K/uL (ref 0.0–0.5)
Eosinophils Relative: 0 %
HCT: 34.6 % — ABNORMAL LOW (ref 36.0–46.0)
Hemoglobin: 10.8 g/dL — ABNORMAL LOW (ref 12.0–15.0)
Immature Granulocytes: 0 %
Lymphocytes Relative: 9 %
Lymphs Abs: 1.6 K/uL (ref 0.7–4.0)
MCH: 25.8 pg — ABNORMAL LOW (ref 26.0–34.0)
MCHC: 31.2 g/dL (ref 30.0–36.0)
MCV: 82.8 fL (ref 80.0–100.0)
Monocytes Absolute: 0.7 K/uL (ref 0.1–1.0)
Monocytes Relative: 4 %
Neutro Abs: 15 K/uL — ABNORMAL HIGH (ref 1.7–7.7)
Neutrophils Relative %: 87 %
Platelets: 209 K/uL (ref 150–400)
RBC: 4.18 MIL/uL (ref 3.87–5.11)
RDW: 15.3 % (ref 11.5–15.5)
WBC: 17.4 K/uL — ABNORMAL HIGH (ref 4.0–10.5)
nRBC: 0 % (ref 0.0–0.2)

## 2024-10-13 LAB — LIPASE, BLOOD: Lipase: >2800 U/L — ABNORMAL HIGH (ref 11–51)

## 2024-10-13 LAB — I-STAT CHEM 8, ED
BUN: 18 mg/dL (ref 8–23)
Calcium, Ion: 1.21 mmol/L (ref 1.15–1.40)
Chloride: 110 mmol/L (ref 98–111)
Creatinine, Ser: 1.4 mg/dL — ABNORMAL HIGH (ref 0.44–1.00)
Glucose, Bld: 203 mg/dL — ABNORMAL HIGH (ref 70–99)
HCT: 34 % — ABNORMAL LOW (ref 36.0–46.0)
Hemoglobin: 11.6 g/dL — ABNORMAL LOW (ref 12.0–15.0)
Potassium: 3.9 mmol/L (ref 3.5–5.1)
Sodium: 141 mmol/L (ref 135–145)
TCO2: 20 mmol/L — ABNORMAL LOW (ref 22–32)

## 2024-10-13 LAB — RESP PANEL BY RT-PCR (RSV, FLU A&B, COVID)  RVPGX2
Influenza A by PCR: NEGATIVE
Influenza B by PCR: NEGATIVE
Resp Syncytial Virus by PCR: NEGATIVE
SARS Coronavirus 2 by RT PCR: NEGATIVE

## 2024-10-13 LAB — TROPONIN I (HIGH SENSITIVITY)
Troponin I (High Sensitivity): 9 ng/L (ref <18)
Troponin I (High Sensitivity): 9 ng/L (ref <18)

## 2024-10-13 LAB — AMMONIA: Ammonia: 37 umol/L — ABNORMAL HIGH (ref 9–35)

## 2024-10-13 LAB — PROTIME-INR
INR: 1.1 (ref 0.8–1.2)
Prothrombin Time: 14.3 s (ref 11.4–15.2)

## 2024-10-13 LAB — I-STAT CG4 LACTIC ACID, ED
Lactic Acid, Venous: 2.7 mmol/L (ref 0.5–1.9)
Lactic Acid, Venous: 1.8 mmol/L (ref 0.5–1.9)

## 2024-10-13 MED ORDER — HYDRALAZINE HCL 20 MG/ML IJ SOLN
10.0000 mg | INTRAMUSCULAR | Status: DC | PRN
  Administered 2024-10-13 – 2024-10-17 (×3): 10 mg via INTRAVENOUS
  Filled 2024-10-13 (×2): qty 1

## 2024-10-13 MED ORDER — FENTANYL CITRATE (PF) 50 MCG/ML IJ SOSY: 25.0000 ug | PREFILLED_SYRINGE | INTRAMUSCULAR | Status: DC | PRN

## 2024-10-13 MED ORDER — HYDRALAZINE HCL 20 MG/ML IJ SOLN
10.0000 mg | Freq: Once | INTRAMUSCULAR | Status: AC
  Administered 2024-10-13: 10 mg via INTRAVENOUS
  Filled 2024-10-13: qty 1

## 2024-10-13 MED ORDER — PIPERACILLIN-TAZOBACTAM 3.375 G IVPB
3.3750 g | Freq: Three times a day (TID) | INTRAVENOUS | Status: DC
  Administered 2024-10-13 – 2024-10-14 (×3): 3.375 g via INTRAVENOUS
  Filled 2024-10-13 (×3): qty 50

## 2024-10-13 MED ORDER — SODIUM CHLORIDE 0.9 % IV SOLN: INTRAVENOUS | Status: DC

## 2024-10-13 MED ORDER — SODIUM CHLORIDE 0.9 % IV BOLUS
500.0000 mL | Freq: Once | INTRAVENOUS | Status: AC
  Administered 2024-10-13: 500 mL via INTRAVENOUS

## 2024-10-13 MED ORDER — HYDRALAZINE HCL 25 MG PO TABS
25.0000 mg | ORAL_TABLET | Freq: Four times a day (QID) | ORAL | Status: DC | PRN
  Filled 2024-10-13: qty 1

## 2024-10-13 MED ORDER — PIPERACILLIN-TAZOBACTAM 3.375 G IVPB 30 MIN: 3.3750 g | Freq: Three times a day (TID) | INTRAVENOUS | Status: DC

## 2024-10-13 MED ORDER — BOOST / RESOURCE BREEZE PO LIQD CUSTOM
1.0000 | Freq: Three times a day (TID) | ORAL | Status: DC
  Administered 2024-10-13 – 2024-10-15 (×5): 1 via ORAL
  Administered 2024-10-17: 237 mL via ORAL

## 2024-10-13 MED ORDER — PIPERACILLIN-TAZOBACTAM 3.375 G IVPB: 3.3750 g | Freq: Three times a day (TID) | INTRAVENOUS | Status: DC

## 2024-10-13 MED ORDER — ONDANSETRON HCL 4 MG/2ML IJ SOLN
4.0000 mg | Freq: Once | INTRAMUSCULAR | Status: AC
  Administered 2024-10-13: 4 mg via INTRAVENOUS
  Filled 2024-10-13: qty 2

## 2024-10-13 MED ORDER — FENTANYL CITRATE (PF) 50 MCG/ML IJ SOSY
50.0000 ug | PREFILLED_SYRINGE | Freq: Once | INTRAMUSCULAR | Status: AC
  Administered 2024-10-13: 50 ug via INTRAVENOUS
  Filled 2024-10-13: qty 1

## 2024-10-13 MED ORDER — PIPERACILLIN-TAZOBACTAM 3.375 G IVPB 30 MIN
3.3750 g | Freq: Once | INTRAVENOUS | Status: AC
  Administered 2024-10-13: 3.375 g via INTRAVENOUS
  Filled 2024-10-13: qty 50

## 2024-10-13 NOTE — ED Notes (Signed)
 Safety Sitter at [facility]

## 2024-10-13 NOTE — ED Notes (Signed)
 CCMD called.

## 2024-10-13 NOTE — ED Notes (Signed)
 Pt found by ED NT with both IVs and monitoring removed. IVT consult placed to resume abx treatment.

## 2024-10-13 NOTE — ED Notes (Signed)
 Pt. Once again got up and ripped her leads off; Pt. Was not able to tell me where she was or why she was in the hospital.

## 2024-10-13 NOTE — ED Notes (Signed)
 Provider at [facility]

## 2024-10-13 NOTE — ED Notes (Signed)
 Provider notified of neuro status change. Attempted to call daughter with no response, left VM.

## 2024-10-13 NOTE — ED Notes (Signed)
 This pt. BP was 228/56; I notified Dr. Pamella

## 2024-10-13 NOTE — H&P (Incomplete)
 History and Physical    Patient: Haley Randall FMW:[id number] DOB: October 16, 1936 DOA: 10/13/2024 DOS: the patient was seen and examined on 10/13/2024 PCP: Corwin Antu, FNP  Patient coming from: {Point_of_Origin:[address]}  Chief Complaint:  Chief Complaint  Patient presents with   Abdominal Pain   HPI: Haley Randall is a 88 y.o. female with medical history significant of   presents with vomiting and stomach pain.  Symptoms began yesterday night with pain localized to the middle of her abdomen and no radiation. She experienced nausea and vomiting, with the vomit described as dark in color, but not resembling coffee grounds. No history of similar symptoms in the past.  She denies taking ibuprofen , BC50 powders, or Aleve, but mentions taking 81 mg of aspirin . Her last bowel movement occurred shortly before the visit, and she is uncertain about its color, though it is suspected to be dark. She was recently treated with antibiotics for a COPD exacerbation.  No previous episodes of similar abdominal pain or vomiting. She is uncertain about the color of her recent bowel movements.   In the emergency department patient was noted to have a temperature of 95.6 F with pulse 58-79, blood pressure elevated up to 222/59, and O2 saturation maintained on room air.  Labs significant for WBC 17.4, hemoglobin 10.8, BUN 18, creatinine 1.44, glucose 201, anion gap 13, lipase greater than 2800, AST 293, ALT 102, total bilirubin 1.7, high-sensitivity troponins negative x 2, and lactic acid 2.7-> 1.8.  Chest x-ray noted no acute abnormality.  CT scan of the abdomen pelvis significant for 4.8 x 3.1 lesion in the right liver with additional smaller lesions measuring up to 2.1 cm and gallbladder distended suggested with features suggesting ill-defined gallbladder wall thickening.  Right upper quadrant ultrasound noted gallbladder wall borderline thickened at 3.1 mm with no gallstones or sonographic Murphy sign.   Review  of Systems: As mentioned in the history of present illness. All other systems reviewed and are negative. Past Medical History:  Diagnosis Date   Chest pain    Dizziness    Hyperlipidemia    Hypertension    Idiopathic gout of foot 08/30/2020   Past Surgical History:  Procedure Laterality Date   BREAST SURGERY Left 1980   biopsy   Social History:  reports that she has quit smoking. She has never used smokeless tobacco. She reports that she does not currently use alcohol. She reports that she does not use drugs.  Allergies  Allergen Reactions   Lisinopril      Causes nose bleeds    Family History  Problem Relation Age of Onset   Heart disease Mother    Heart disease Father    Stroke Maternal Grandmother    Stroke Maternal Grandfather    Heart disease Paternal Grandmother    Heart disease Paternal Grandfather    Cancer Neg Hx    Diabetes Neg Hx     Prior to Admission medications   Medication Sig Start Date End Date Taking? Authorizing Provider  amLODipine  (NORVASC ) 10 MG tablet Take 1 tablet (10 mg total) by mouth daily. 09/01/23   Dunn, Dayna N, PA-C  aspirin  EC 81 MG tablet Take 81 mg by mouth daily. Swallow whole.    [provider]  atorvastatin  (LIPITOR) 40 MG tablet Take 1 tablet (40 mg total) by mouth daily. 09/04/23 12/03/23  Dunn, Dayna N, PA-C  cholecalciferol (VITAMIN D3) 25 MCG (1000 UNIT) tablet Take 1,000 Units by mouth daily.    [provider]  Physical Exam: Vitals:   10/13/24 0750 10/13/24 0759 10/13/24 0802 10/13/24 0820  BP: (!) 231/56 (!) 228/56  (!) 206/51  Pulse: 66   77  Resp: 17   20  Temp:      TempSrc:      SpO2: 100%  100% 100%   *** Data Reviewed: {Tip this will not be part of the note when signed- Document your independent interpretation of telemetry tracing, EKG, lab, Radiology test or any other diagnostic tests. Add any new diagnostic test ordered today. (Optional):26781} {Results:26384}  Assessment and  Plan:   Pancreatitis Acute. - Admit to a telemetry bed - Continue IV fluids   Acute metabolic encephalopathy Patient noted to be altered and  Leukocytosis Acute.  WBC elevated at 17.4.   Elevated liver function studies Acute.  AST 293, ALT 102, total bilirubin 1.7, - Continue to follow LFTs  Liver lesion   DVT prophylaxis: Lovenox Advance Care Planning:   Code Status: Not on file ***  Consults: ***  Family Communication: ***  Severity of Illness: {Observation/Inpatient:21159}  Author: Maximino DELENA Sharps, MD 10/13/2024 9:21 AM  For on call review [email or web address].

## 2024-10-13 NOTE — ED Notes (Signed)
 Attempted USIV x2 with no success.

## 2024-10-13 NOTE — ED Provider Notes (Signed)
 [place] EMERGENCY DEPARTMENT AT [facility] Provider Note   CSN: [id number] Arrival date & time: 10/13/24  0430     Patient presents with: Abdominal Pain   Haley Randall is a 88 y.o. female.   The history is provided by the patient, the EMS personnel, medical records and a relative.  Haley Randall is a 88 y.o. female who presents to the Emergency Department complaining of abdominal pain.  She presents to the emergency department by EMS for evaluation of right upper quadrant abdominal pain, nausea that started yesterday ago.  No fevers, chest pain, difficulty breathing, vomiting, diarrhea.  She comes to the emergency department from home.  EMS report blood pressures in the 90s and she received IV fluids prior to ED arrival.  They also report hypoxia to the 70s. Patient does not currently take any medications due to not wanting to take them anymore.  She does have a history of hypertension, stage III CKD    Prior to Admission medications   Medication Sig Start Date End Date Taking? Authorizing Provider  amLODipine  (NORVASC ) 10 MG tablet Take 1 tablet (10 mg total) by mouth daily. 09/01/23   Dunn, Dayna N, PA-C  aspirin  EC 81 MG tablet Take 81 mg by mouth daily. Swallow whole.    [provider]  atorvastatin  (LIPITOR) 40 MG tablet Take 1 tablet (40 mg total) by mouth daily. 09/04/23 12/03/23  Dunn, Dayna N, PA-C  cholecalciferol (VITAMIN D3) 25 MCG (1000 UNIT) tablet Take 1,000 Units by mouth daily.    [provider]    Allergies: Lisinopril     Review of Systems  All other systems reviewed and are negative.   Updated Vital Signs BP (!) 222/59   Pulse 75   Temp (!) 95.6 F (35.3 C) (Rectal)   Resp 17   SpO2 100%   Physical Exam Vitals and nursing note reviewed.  Constitutional:      Appearance: She is well-developed.  HENT:     Head: Normocephalic and atraumatic.  Cardiovascular:     Rate and Rhythm: Normal rate and regular rhythm.     Heart  sounds: No murmur heard. Pulmonary:     Effort: Pulmonary effort is normal. No respiratory distress.     Breath sounds: Normal breath sounds.  Abdominal:     Palpations: Abdomen is soft.     Tenderness: There is no abdominal tenderness. There is no guarding or rebound.  Musculoskeletal:        General: No tenderness.     Comments: No lower extremity edema.  Skin:    General: Skin is dry.     Capillary Refill: Capillary refill takes more than 3 seconds.     Comments: Skin is cool to touch  Neurological:     Mental Status: She is alert and oriented to person, place, and time.  Psychiatric:        Behavior: Behavior normal.     (all labs ordered are listed, but only abnormal results are displayed) Labs Reviewed  CBC WITH DIFFERENTIAL/PLATELET - Abnormal; Notable for the following components:      Result Value   WBC 17.4 (*)    Hemoglobin 10.8 (*)    HCT 34.6 (*)    MCH 25.8 (*)    Neutro Abs 15.0 (*)    All other components within normal limits  URINALYSIS, W/ REFLEX TO CULTURE (INFECTION SUSPECTED) - Abnormal; Notable for the following components:   APPearance CLOUDY (*)    Glucose, UA  50 (*)    Hgb urine dipstick SMALL (*)    Leukocytes,Ua LARGE (*)    Bacteria, UA MANY (*)    All other components within normal limits  I-STAT CG4 LACTIC ACID, ED - Abnormal; Notable for the following components:   Lactic Acid, Venous 2.7 (*)    All other components within normal limits  I-STAT CHEM 8, ED - Abnormal; Notable for the following components:   Creatinine, Ser 1.40 (*)    Glucose, Bld 203 (*)    TCO2 20 (*)    Hemoglobin 11.6 (*)    HCT 34.0 (*)    All other components within normal limits  CULTURE, BLOOD (ROUTINE X 2)  CULTURE, BLOOD (ROUTINE X 2)  PROTIME-INR  COMPREHENSIVE METABOLIC PANEL WITH GFR  LIPASE, BLOOD  I-STAT CG4 LACTIC ACID, ED  TROPONIN I (HIGH SENSITIVITY)  TROPONIN I (HIGH SENSITIVITY)    EKG: EKG Interpretation Date/Time:  Thursday October 13 2024 04:41:34 EST Ventricular Rate:  58 PR Interval:  159 QRS Duration:  79 QT Interval:  410 QTC Calculation: 403 R Axis:   59  Text Interpretation: Sinus arrhythmia Borderline T abnormalities, inferior leads Confirmed by Griselda Norris [phone]) on 10/13/2024 4:45:15 AM  Radiology: CT ABDOMEN PELVIS WO CONTRAST Result Date: 10/13/2024 CLINICAL DATA:  Abdominal pain with nausea. EXAM: CT ABDOMEN AND PELVIS WITHOUT CONTRAST TECHNIQUE: Multidetector CT imaging of the abdomen and pelvis was performed following the standard protocol without IV contrast. RADIATION DOSE REDUCTION: This exam was performed according to the departmental dose-optimization program which includes automated exposure control, adjustment of the mA and/or kV according to patient size and/or use of iterative reconstruction technique. COMPARISON:  None Available. FINDINGS: Lower chest: Calcified granuloma noted left lower lobe. Hepatobiliary: Small volume perihepatic free fluid with findings suspicious for periportal edema although assessment is limited by lack of intravenous contrast material. 4.8 x 3.1 cm subtle lesion identified right liver on image 15 of series 3. Additional smaller lesions seen towards the dome of the liver measuring up to 2.1 cm on image 9 of series 3. Gallbladder is distended with features suggesting gallbladder wall thickening. No calcified gallstones evident. There is edema/fluid in the region the gallbladder fossa and hepatoduodenal ligament. Although assessment limited by lack of intravenous contrast material, no gross dilatation of the common bile duct. Pancreas: No definite pancreatic mass.  No main duct dilatation. Spleen: No splenomegaly. No suspicious focal mass lesion. Adrenals/Urinary Tract: No adrenal nodule or mass. Right kidney unremarkable. Low-density lesions in the left kidney approach water density and are likely cysts. No evidence for hydroureter. Bladder unremarkable. Stomach/Bowel: Stomach is  unremarkable. No gastric wall thickening. No evidence of outlet obstruction. Duodenum passes through the area of edema/inflammatory change of the right upper quadrant. Duodenum does not cross the midline as expected suggesting a component of small bowel malrotation. No small bowel wall thickening. No small bowel dilatation. Despite the course of the duodenum, the cecum is positioned in the right lower quadrant. The terminal ileum is normal. The appendix is normal. There is some fluid adjacent to the distal ascending colon and hepatic flexure. Diverticular disease is noted in the left colon without diverticulitis. Vascular/Lymphatic: There is advanced atherosclerotic calcification of the abdominal aorta without aneurysm. No abdominal lymphadenopathy. No pelvic lymphadenopathy. Reproductive: Uterine fibroids evident.  There is no adnexal mass. Other: No substantial free fluid in the pelvis. Musculoskeletal: No worrisome lytic or sclerotic osseous abnormality. IMPRESSION: 1. 4.8 x 3.1 cm subtle lesion identified right liver with  additional smaller lesions seen towards the dome of the liver measuring up to 2.1 cm. Imaging features are indeterminate. MRI of the abdomen with and without contrast recommended to further evaluate as metastatic disease or primary hepatic neoplasm not excluded. 2. Gallbladder is distended with features suggesting ill-defined gallbladder wall thickening on this noncontrast study. No calcified gallstones evident. There is edema/fluid in the region the gallbladder fossa and hepatoduodenal ligament. Imaging features are nonspecific but could be seen in the setting of acute cholecystitis. Right upper quadrant ultrasound may prove helpful to further evaluate. 3. Edema/inflammation is identified along the distal ascending colon and hepatic flexure, potentially secondary to changes described above in the gallbladder fossa. No associated colonic wall thickening to suggest colon represents etiology of  these findings, but colitis is not excluded. 4. Small volume perihepatic free fluid with question of periportal edema. 5. Left colonic diverticulosis without diverticulitis. 6. Uterine fibroids. 7.  Aortic Atherosclerosis (ICD10-I70.0). Electronically Signed   By: Camellia Candle M.D.   On: 10/13/2024 06:41   DG Chest Port 1 View Result Date: 10/13/2024 CLINICAL DATA:  Possible sepsis. EXAM: PORTABLE CHEST 1 VIEW COMPARISON:  None Available. FINDINGS: The lungs are clear without focal pneumonia, edema, pneumothorax or pleural effusion. Biapical pleuroparenchymal scarring evident. Cardiopericardial silhouette is at upper limits of normal for size. No acute bony abnormality. Telemetry leads overlie the chest. IMPRESSION: No active disease. Electronically Signed   By: Camellia Candle M.D.   On: 10/13/2024 05:46     Procedures  CRITICAL CARE Performed by: Almarie Burner   Total critical care time: 35 minutes  Critical care time was exclusive of separately billable procedures and treating other patients.  Critical care was necessary to treat or prevent imminent or life-threatening deterioration.  Critical care was time spent personally by me on the following activities: development of treatment plan with patient and/or surrogate as well as nursing, discussions with consultants, evaluation of patient's response to treatment, examination of patient, obtaining history from patient or surrogate, ordering and performing treatments and interventions, ordering and review of laboratory studies, ordering and review of radiographic studies, pulse oximetry and re-evaluation of patient's condition.  Medications Ordered in the ED  fentaNYL (SUBLIMAZE) injection 50 mcg (50 mcg Intravenous Given 10/13/24 0523)  ondansetron (ZOFRAN) injection 4 mg (4 mg Intravenous Given 10/13/24 0523)  sodium chloride 0.9 % bolus 500 mL (500 mLs Intravenous New Bag/Given 10/13/24 0544)  piperacillin-tazobactam (ZOSYN) IVPB 3.375 g  (0 g Intravenous Stopped 10/13/24 9362)                                    Medical Decision Making Amount and/or Complexity of Data Reviewed Labs: ordered. Radiology: ordered.  Risk Prescription drug management.   Patient with history of hypertension, CKD no longer on medication here for evaluation of right upper quadrant pain and nausea.  She appears uncomfortable on examination but does not have appreciable tenderness on exam.  Labs significant for leukocytosis, elevated lactic acid.  She was treated with gentle IV fluid hydration.  She was found to be hypothermic.  Concern for sepsis and she was started on antibiotics for possible gallbladder source.  CT abdomen pelvis was obtained without contrast given her history of kidney disease, which demonstrates changes to her gallbladder as well as liver.  Plan to obtain formal ultrasound.  Patient care transferred pending CMP, lipase, troponin and right upper quadrant ultrasound.  Patient and daughter  updated findings of studies to this point in time.  Anticipate patient will need admission for ongoing workup and treatment.  Patient's pain is improved after fentanyl administration.     Final diagnoses:  None    ED Discharge Orders     None          Griselda Norris, MD 10/13/24 0710

## 2024-10-13 NOTE — ED Notes (Signed)
 Pt. Keeps removing leads and will not call out whenever she needs to get up

## 2024-10-13 NOTE — ED Triage Notes (Signed)
 Pt arrived by EMS for abdominal pain and nausea. EMS placed her on non-rebreather 10L. Denies shortness of breath.  EMS vitals  BP 100/60 RR 23 HR 58 O2 75% CBG 220

## 2024-10-13 NOTE — H&P (Incomplete)
 History and Physical    Patient: Haley Randall FMW:[id number] DOB: August 16, 1936 DOA: 10/13/2024 DOS: the patient was seen and examined on 10/13/2024 PCP: Corwin Antu, FNP  Patient coming from: Home  Chief Complaint:  Chief Complaint  Patient presents with  . Abdominal Pain   HPI: Haley Randall is a 88 y.o. female with medical history significant of hypertension, hyperlipidemia, and gout presents with vomiting and stomach pain.  Symptoms began yesterday night with pain localized to the middle of her abdomen and no radiation. She experienced nausea and vomiting, with the vomit described as dark in color, but not resembling coffee grounds. No history of similar symptoms in the past.  She denies taking ibuprofen , BC50 powders, or Aleve, but mentions taking 81 mg of aspirin . Her last bowel movement occurred shortly before the visit, and she is uncertain about its color, though it is suspected to be dark. She was recently treated with antibiotics for a COPD exacerbation.  No previous episodes of similar abdominal pain or vomiting. She is uncertain about the color of her recent bowel movements.  In route with EMS patient was noted to have systolic blood pressures in the 70s with O2 saturations initially reported in the 70s for which patient was placed on breathing  In the emergency department patient was noted to have a temperature of 95.6 F with pulse 58-79, blood pressure elevated up to 222/59, and O2 saturation maintained on room air.  Labs significant for WBC 17.4, hemoglobin 10.8, BUN 18, creatinine 1.44, glucose 201, anion gap 13, lipase greater than 2800, AST 293, ALT 102, total bilirubin 1.7, high-sensitivity troponins negative x 2, and lactic acid 2.7-> 1.8.  Chest x-ray noted no acute abnormality.  CT scan of the abdomen pelvis significant for 4.8 x 3.1 lesion in the right liver with additional smaller lesions measuring up to 2.1 cm and gallbladder distended suggested with features  suggesting ill-defined gallbladder wall thickening, and erythema/inflammation identified along the distal ascending colon and hepatic flexure thought possibly secondary to changes related around the gallbladder fossa.  Right upper quadrant ultrasound noted gallbladder wall borderline thickened at 3.1 mm with no gallstones or sonographic Murphy sign.  Patient had been given fentanyl 50 g IV, 500 mL of bolus both slowly, and started on empiric antibiotics of Zosyn.   Review of Systems: As mentioned in the history of present illness. All other systems reviewed and are negative. Past Medical History:  Diagnosis Date  . Chest pain   . Dizziness   . Hyperlipidemia   . Hypertension   . Idiopathic gout of foot 08/30/2020   Past Surgical History:  Procedure Laterality Date  . BREAST SURGERY Left 1980   biopsy   Social History:  reports that she has quit smoking. She has never used smokeless tobacco. She reports that she does not currently use alcohol. She reports that she does not use drugs.  Allergies  Allergen Reactions  . Lisinopril      Causes nose bleeds    Family History  Problem Relation Age of Onset  . Heart disease Mother   . Heart disease Father   . Stroke Maternal Grandmother   . Stroke Maternal Grandfather   . Heart disease Paternal Grandmother   . Heart disease Paternal Grandfather   . Cancer Neg Hx   . Diabetes Neg Hx     Prior to Admission medications   Medication Sig Start Date End Date Taking? Authorizing Provider  amLODipine  (NORVASC ) 10 MG tablet Take 1 tablet (10 mg total)  by mouth daily. 09/01/23   Dunn, Dayna N, PA-C  aspirin  EC 81 MG tablet Take 81 mg by mouth daily. Swallow whole.    [provider]  atorvastatin  (LIPITOR) 40 MG tablet Take 1 tablet (40 mg total) by mouth daily. 09/04/23 12/03/23  Dunn, Dayna N, PA-C  cholecalciferol (VITAMIN D3) 25 MCG (1000 UNIT) tablet Take 1,000 Units by mouth daily.    [provider]    Physical  Exam: Vitals:   10/13/24 0750 10/13/24 0759 10/13/24 0802 10/13/24 0820  BP: (!) 231/56 (!) 228/56  (!) 206/51  Pulse: 66   77  Resp: 17   20  Temp:      TempSrc:      SpO2: 100%  100% 100%     Constitutional: Elderly female currently NAD, calm, comfortable Eyes: PERRL, lids and conjunctivae normal ENMT: Mucous membranes are moist.Normal dentition.  Neck: normal, supple Respiratory: clear to auscultation bilaterally, no wheezing, no crackles. Normal respiratory effort. No accessory muscle use.  Cardiovascular: Regular rate and rhythm, no murmurs / rubs / gallops. No extremity edema. 2+ pedal pulses. No carotid bruits.  Abdomen: Tenderness to palpation palpation epigastrically as well as tenderness palpation of the left lower quadrant.  Bowel sounds present in all 4 quadrants Musculoskeletal: no clubbing / cyanosis. No joint deformity upper and lower extremities. Good ROM, no contractures. Normal muscle tone.  Skin: no rashes, lesions, ulcers. No induration Neurologic: CN 2-12 grossly intact.  Strength 5/5 in all 4.  Psychiatric: Patient is confused times.  Alert and oriented to person, but not time  Data Reviewed: {Tip this will not be part of the note when signed- Document your independent interpretation of telemetry tracing, EKG, lab, Radiology test or any other diagnostic tests. Add any new diagnostic test ordered today. (Optional):26781} EKG reveals sinus rhythm at 58 bpm.  Reviewed labs, imaging, and pertinent records as documented.  Assessment and Plan:  Abdominal pain Pancreatitis Acute.  Patient presented with complaints of abdominal pain.  Labs significant for lipase greater than 2800.  Noted to have tenderness to palpation on physical exam.  CT noting right liver lesions, distended gallbladder with pericholecystic fluid, and an elevation along the distal ascending colon hepatic flexure. - Admit to a telemetry bed - Continue IV fluids - Continue fentanyl as needed for  pain - Consider GI consult in a.m. Acute metabolic encephalopathy Patient noted to be altered and seems to be confused.  Question possibility of liver disease versus underlying dementia, and/or secondary to infection. - Check ammonia level  Leukocytosis Acute.  WBC elevated at 17.4. - Recheck CBC tomorrow morning  Elevated liver function studies Acute.  AST 293, ALT 102, total bilirubin 1.7, - Continue to follow LFTs  Liver lesion CT noting right liver lesions - Recommend MRCP once issues have resolved.  DVT prophylaxis: Lovenox Advance Care Planning:   Code Status: Not on file ***  Consults: ***  Family Communication: ***  Severity of Illness: {Observation/Inpatient:21159}  Author: Maximino DELENA Sharps, MD 10/13/2024 9:21 AM  For on call review [email or web address].

## 2024-10-13 NOTE — ED Notes (Signed)
 Patient transported to CT

## 2024-10-13 NOTE — ED Notes (Addendum)
 Unable to give PRN hydralazine d/t loss of IV access. Will give when IVT places new line. Provider notified.

## 2024-10-13 NOTE — ED Notes (Signed)
 Bair hugger applied to to patient.

## 2024-10-13 NOTE — ED Provider Notes (Signed)
  Physical Exam  BP (!) 188/34 (BP Location: Left Arm)   Pulse 77   Temp 98 F (36.7 C) (Oral)   Resp 20   SpO2 100%   Physical Exam Vitals and nursing note reviewed.  HENT:     Head: Normocephalic and atraumatic.  Eyes:     Pupils: Pupils are equal, round, and reactive to light.  Cardiovascular:     Rate and Rhythm: Normal rate and regular rhythm.  Pulmonary:     Effort: Pulmonary effort is normal.     Breath sounds: Normal breath sounds.  Abdominal:     Palpations: Abdomen is soft.     Tenderness: There is abdominal tenderness in the right upper quadrant. There is no guarding or rebound.  Skin:    General: Skin is warm and dry.  Neurological:     Mental Status: She is alert.  Psychiatric:        Mood and Affect: Mood normal.     Procedures  Procedures  ED Course / MDM   Clinical Course as of 10/13/24 0935  Thu Oct 13, 2024  0853 I reviewed the CT findings with the patient including liver lesions and concern for acute cholecystitis.  Also reviewed laboratory findings with elevated LFTs and lipase.  Right upper quadrant ultrasound not definitively consistent with acute cholecystitis but there is some Perry cholecystic fluid.  Discussed with general surgery PA Maczis who will evaluate patient in the ED.  Recommends admission to medicine and plan for further nonemergent GI workup  Patient now significantly hypertensive after being hypotensive and bradycardic earlier.  Has not been taking her home amlodipine .  Will trial hydralazine for blood pressure management [MP]  [phone] Discussed with hospitalist Dr. Claudene who accepts patient for admission [MP]    Clinical Course User Index [MP] Pamella Ozell LABOR, DO   Medical Decision Making I, Ozell Pamella DO, have assumed care of this patient from the previous provider pending regarding ultrasound, remainder of laboratory results and admission  Amount and/or Complexity of Data Reviewed Labs: ordered. Radiology:  ordered.  Risk Prescription drug management.          Pamella Ozell LABOR, DO 10/13/24 [phone]

## 2024-10-13 NOTE — ED Notes (Signed)
 General Surgery PA at [facility]

## 2024-10-13 NOTE — Consult Note (Signed)
 Consult Note  Haley Randall [birth year]  [id number].    Requesting MD: Ozell Marine, DO Chief Complaint/Reason for Consult: RUQ pain, possible acalculous cholecystitis  HPI:  Patient is an 88 year old female who presented to the ED today with abdominal pain that started yesterday AM. She denies fever, chills, nausea, vomiting, diarrhea, urinary symptoms. She reports she has had some similar episodes of pain that were more mild over the last few months. Patient denies prior abdominal surgery. PMH otherwise significant for HTN, HLD, CKD. She is not on any blood thinners. Allergic to lisinopril  with reaction of nosebleeds. No family at bedside this AM. She reports she does not drink alcohol or use illicit drugs. She smoked cigarettes previously but does not currently smoke. 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   ROS: Negative other than HPI  Family History  Problem Relation Age of Onset   Heart disease Mother    Heart disease Father    Stroke Maternal Grandmother    Stroke Maternal Grandfather    Heart disease Paternal Grandmother    Heart disease Paternal Grandfather    Cancer Neg Hx    Diabetes Neg Hx     Past Medical History:  Diagnosis Date   Chest pain    Dizziness    Hyperlipidemia    Hypertension    Idiopathic gout of foot 08/30/2020    Past Surgical History:  Procedure Laterality Date   BREAST SURGERY Left 1980   biopsy    Social History:  reports that she has quit smoking. She has never used smokeless tobacco. She reports that she does not currently use alcohol. She reports that she does not use drugs.  Allergies:  Allergies  Allergen Reactions   Lisinopril      Causes nose bleeds    (Not in a hospital admission)   Blood pressure (!) 212/53, pulse 79, temperature 98 F (36.7 C), temperature source Oral, resp. rate (!) 21, SpO2 100%. Physical Exam:  General: pleasant, WD, WN elderly female who is laying in bed in NAD HEENT: head is  normocephalic, atraumatic.  Sclera are anicteric.  Pupils equal and round.  Ears and nose without any masses or lesions.  Mouth is pink and moist Heart: regular, rate, and rhythm. Palpable radial and pedal pulses bilaterally Lungs: CTAB, no wheezes, rhonchi, or rales noted.  Respiratory effort nonlabored Abd: soft, mild ttp in epigastric abdomen, no RUQ TTP on exam and negative Murphy sign, ND, no masses, hernias, or organomegaly MS: all 4 extremities are symmetrical with no cyanosis, clubbing, or edema. Skin: warm and dry with no masses, lesions, or rashes Neuro: Cranial nerves 2-12 grossly intact, sensation is normal throughout Psych: A&Ox3 with an appropriate affect.   Results for orders placed or performed during the hospital encounter of 10/13/24 (from the past 48 hours)  Comprehensive metabolic panel     Status: Abnormal   Collection Time: 10/13/24  5:17 AM  Result Value Ref Range   Sodium 140 135 - 145 mmol/L   Potassium 3.9 3.5 - 5.1 mmol/L   Chloride 108 98 - 111 mmol/L   CO2 19 (L) 22 - 32 mmol/L   Glucose, Bld 201 (H) 70 - 99 mg/dL    Comment: Glucose reference range applies only to samples taken after fasting for at least 8 hours.   BUN 18 8 - 23 mg/dL   Creatinine, Ser 8.55 (H) 0.44 - 1.00 mg/dL   Calcium  9.4 8.9 - 10.3 mg/dL  Total Protein 6.6 6.5 - 8.1 g/dL   Albumin 3.5 3.5 - 5.0 g/dL   AST 706 (H) 15 - 41 U/L   ALT 102 (H) 0 - 44 U/L   Alkaline Phosphatase 88 38 - 126 U/L   Total Bilirubin 1.7 (H) 0.0 - 1.2 mg/dL   GFR, Estimated 35 (L) >60 mL/min    Comment: (NOTE) Calculated using the CKD-EPI Creatinine Equation (2021)    Anion gap 13 5 - 15    Comment: Performed at [facility], [address] [place], [place] [address]  CBC with Differential     Status: Abnormal   Collection Time: 10/13/24  5:17 AM  Result Value Ref Range   WBC 17.4 (H) 4.0 - 10.5 K/uL   RBC 4.18 3.87 - 5.11 MIL/uL   Hemoglobin 10.8 (L) 12.0 - 15.0 g/dL   HCT 65.3 (L) 63.9 - 53.9  %   MCV 82.8 80.0 - 100.0 fL   MCH 25.8 (L) 26.0 - 34.0 pg   MCHC 31.2 30.0 - 36.0 g/dL   RDW 84.6 88.4 - 84.4 %   Platelets 209 150 - 400 K/uL   nRBC 0.0 0.0 - 0.2 %   Neutrophils Relative % 87 %   Neutro Abs 15.0 (H) 1.7 - 7.7 K/uL   Lymphocytes Relative 9 %   Lymphs Abs 1.6 0.7 - 4.0 K/uL   Monocytes Relative 4 %   Monocytes Absolute 0.7 0.1 - 1.0 K/uL   Eosinophils Relative 0 %   Eosinophils Absolute 0.0 0.0 - 0.5 K/uL   Basophils Relative 0 %   Basophils Absolute 0.0 0.0 - 0.1 K/uL   Immature Granulocytes 0 %   Abs Immature Granulocytes 0.07 0.00 - 0.07 K/uL    Comment: Performed at [facility], [address], [place], [place] [address]  Protime-INR     Status: None   Collection Time: 10/13/24  5:17 AM  Result Value Ref Range   Prothrombin Time 14.3 11.4 - 15.2 seconds   INR 1.1 0.8 - 1.2    Comment: (NOTE) INR goal varies based on device and disease states. Performed at [facility], [address] [place], [place] [address]   Lipase, blood     Status: Abnormal   Collection Time: 10/13/24  5:17 AM  Result Value Ref Range   Lipase >2,800 (H) 11 - 51 U/L    Comment: RESULTS CONFIRMED BY MANUAL DILUTION Performed at [facility], [address], [place], [place] [address]   Troponin I (High Sensitivity)     Status: None   Collection Time: 10/13/24  5:17 AM  Result Value Ref Range   Troponin I (High Sensitivity) 9 <18 ng/L    Comment: (NOTE) Elevated high sensitivity troponin I (hsTnI) values and significant  changes across serial measurements may suggest ACS but many other  chronic and acute conditions are known to elevate hsTnI results.  Refer to the Links section for chest pain algorithms and additional  guidance. Performed at [facility], [address] [place], [place] [address]   I-Stat Lactic Acid, ED     Status: Abnormal   Collection Time: 10/13/24  5:18 AM  Result Value Ref Range   Lactic Acid, Venous 2.7 (HH) 0.5 - 1.9  mmol/L   Comment NOTIFIED PHYSICIAN   I-Stat Chem 8, ED     Status: Abnormal   Collection Time: 10/13/24  5:18 AM  Result Value Ref Range  Sodium 141 135 - 145 mmol/L   Potassium 3.9 3.5 - 5.1 mmol/L   Chloride 110 98 - 111 mmol/L   BUN 18 8 - 23 mg/dL   Creatinine, Ser 8.59 (H) 0.44 - 1.00 mg/dL   Glucose, Bld 796 (H) 70 - 99 mg/dL    Comment: Glucose reference range applies only to samples taken after fasting for at least 8 hours.   Calcium , Ion 1.21 1.15 - 1.40 mmol/L   TCO2 20 (L) 22 - 32 mmol/L   Hemoglobin 11.6 (L) 12.0 - 15.0 g/dL   HCT 65.9 (L) 63.9 - 53.9 %  Urinalysis, w/ Reflex to Culture (Infection Suspected) -Urine, Clean Catch     Status: Abnormal   Collection Time: 10/13/24  6:16 AM  Result Value Ref Range   Specimen Source URINE, CLEAN CATCH    Color, Urine YELLOW YELLOW   APPearance CLOUDY (A) CLEAR   Specific Gravity, Urine 1.010 1.005 - 1.030   pH 6.0 5.0 - 8.0   Glucose, UA 50 (A) NEGATIVE mg/dL   Hgb urine dipstick SMALL (A) NEGATIVE   Bilirubin Urine NEGATIVE NEGATIVE   Ketones, ur NEGATIVE NEGATIVE mg/dL   Protein, ur NEGATIVE NEGATIVE mg/dL   Nitrite NEGATIVE NEGATIVE   Leukocytes,Ua LARGE (A) NEGATIVE   RBC / HPF 0-5 0 - 5 RBC/hpf   WBC, UA 6-10 0 - 5 WBC/hpf    Comment:        Reflex urine culture not performed if WBC <=10, OR if Squamous epithelial cells >5. If Squamous epithelial cells >5 suggest recollection.    Bacteria, UA MANY (A) NONE SEEN   Squamous Epithelial / HPF 11-20 0 - 5 /HPF   Mucus PRESENT     Comment: Performed at [facility], [address] [place], [place] [address]  Troponin I (High Sensitivity)     Status: None   Collection Time: 10/13/24  6:27 AM  Result Value Ref Range   Troponin I (High Sensitivity) 9 <18 ng/L    Comment: (NOTE) Elevated high sensitivity troponin I (hsTnI) values and significant  changes across serial measurements may suggest ACS but many other  chronic and acute conditions are known to  elevate hsTnI results.  Refer to the Links section for chest pain algorithms and additional  guidance. Performed at [facility], [address], [place], [place] [address]   I-Stat Lactic Acid, ED     Status: None   Collection Time: 10/13/24  6:40 AM  Result Value Ref Range   Lactic Acid, Venous 1.8 0.5 - 1.9 mmol/L  Resp panel by RT-PCR (RSV, Flu A&B, Covid) Anterior Nasal Swab     Status: None   Collection Time: 10/13/24  7:14 AM   Specimen: Anterior Nasal Swab  Result Value Ref Range   SARS Coronavirus 2 by RT PCR NEGATIVE NEGATIVE   Influenza A by PCR NEGATIVE NEGATIVE   Influenza B by PCR NEGATIVE NEGATIVE    Comment: (NOTE) The Xpert Xpress SARS-CoV-2/FLU/RSV plus assay is intended as an aid in the diagnosis of influenza from Nasopharyngeal swab specimens and should not be used as a sole basis for treatment. Nasal washings and aspirates are unacceptable for Xpert Xpress SARS-CoV-2/FLU/RSV testing.  Fact Sheet for Patients: bloggercourse.com  Fact Sheet for Healthcare Providers: seriousbroker.it  This test is not yet approved or cleared by the [place]  FDA and has been authorized for detection and/or diagnosis of SARS-CoV-2 by FDA under an Emergency Use Authorization (EUA). This EUA  will remain in effect (meaning this test can be used) for the duration of the COVID-19 declaration under Section 564(b)(1) of the Act, 21 U.S.C. section 360bbb-3(b)(1), unless the authorization is terminated or revoked.     Resp Syncytial Virus by PCR NEGATIVE NEGATIVE    Comment: (NOTE) Fact Sheet for Patients: bloggercourse.com  Fact Sheet for Healthcare Providers: seriousbroker.it  This test is not yet approved or cleared by the [place]  FDA and has been authorized for detection and/or diagnosis of SARS-CoV-2 by FDA under an Emergency Use Authorization (EUA). This  EUA will remain in effect (meaning this test can be used) for the duration of the COVID-19 declaration under Section 564(b)(1) of the Act, 21 U.S.C. section 360bbb-3(b)(1), unless the authorization is terminated or revoked.  Performed at [facility], [address], [place], [place] [address]    US  Abdomen Limited RUQ (LIVER/GB) Result Date: 10/13/2024 CLINICAL DATA:  Right upper quadrant abdominal pain. EXAM: ULTRASOUND ABDOMEN LIMITED RIGHT UPPER QUADRANT COMPARISON:  None Available. FINDINGS: Gallbladder: Gallbladder wall is upper normal to borderline thickened at 3.1 mm. No echogenic gallstones evident. Pericholecystic fluid evident. Sonographer reports no sonographic Murphy sign. Common bile duct: Diameter: 8 mm Liver: Liver lesion seen on CT earlier today not readily evident by ultrasound. Liver appears heterogeneous and echogenic. Portal vein is patent on color Doppler imaging with normal direction of blood flow towards the liver. Other: None. IMPRESSION: 1. Gallbladder wall is upper normal to borderline thickened at 3.1 mm. No echogenic gallstones evident and no sonographic Murphy sign. As on recent CT, pericholecystic fluid noted. Cholecystitis is not excluded. Correlation for signs/symptoms of cholecystitis recommended. Nuclear scintigraphy may prove helpful to further evaluate as clinically warranted. 2. Common bile duct is dilated at 8 mm. Correlation with liver function test recommended. 3. Liver appears heterogeneous and echogenic. This is a nonspecific finding but can be seen in the setting of hepatic steatosis. 4. Liver lesions seen on CT earlier today not readily evident by ultrasound. Electronically Signed   By: Camellia Candle M.D.   On: 10/13/2024 07:27   CT ABDOMEN PELVIS WO CONTRAST Result Date: 10/13/2024 CLINICAL DATA:  Abdominal pain with nausea. EXAM: CT ABDOMEN AND PELVIS WITHOUT CONTRAST TECHNIQUE: Multidetector CT imaging of the abdomen and pelvis was performed  following the standard protocol without IV contrast. RADIATION DOSE REDUCTION: This exam was performed according to the departmental dose-optimization program which includes automated exposure control, adjustment of the mA and/or kV according to patient size and/or use of iterative reconstruction technique. COMPARISON:  None Available. FINDINGS: Lower chest: Calcified granuloma noted left lower lobe. Hepatobiliary: Small volume perihepatic free fluid with findings suspicious for periportal edema although assessment is limited by lack of intravenous contrast material. 4.8 x 3.1 cm subtle lesion identified right liver on image 15 of series 3. Additional smaller lesions seen towards the dome of the liver measuring up to 2.1 cm on image 9 of series 3. Gallbladder is distended with features suggesting gallbladder wall thickening. No calcified gallstones evident. There is edema/fluid in the region the gallbladder fossa and hepatoduodenal ligament. Although assessment limited by lack of intravenous contrast material, no gross dilatation of the common bile duct. Pancreas: No definite pancreatic mass.  No main duct dilatation. Spleen: No splenomegaly. No suspicious focal mass lesion. Adrenals/Urinary Tract: No adrenal nodule or mass. Right kidney unremarkable. Low-density lesions in the left kidney approach water density and are likely cysts. No evidence for hydroureter. Bladder unremarkable. Stomach/Bowel: Stomach is unremarkable. No gastric  wall thickening. No evidence of outlet obstruction. Duodenum passes through the area of edema/inflammatory change of the right upper quadrant. Duodenum does not cross the midline as expected suggesting a component of small bowel malrotation. No small bowel wall thickening. No small bowel dilatation. Despite the course of the duodenum, the cecum is positioned in the right lower quadrant. The terminal ileum is normal. The appendix is normal. There is some fluid adjacent to the distal  ascending colon and hepatic flexure. Diverticular disease is noted in the left colon without diverticulitis. Vascular/Lymphatic: There is advanced atherosclerotic calcification of the abdominal aorta without aneurysm. No abdominal lymphadenopathy. No pelvic lymphadenopathy. Reproductive: Uterine fibroids evident.  There is no adnexal mass. Other: No substantial free fluid in the pelvis. Musculoskeletal: No worrisome lytic or sclerotic osseous abnormality. IMPRESSION: 1. 4.8 x 3.1 cm subtle lesion identified right liver with additional smaller lesions seen towards the dome of the liver measuring up to 2.1 cm. Imaging features are indeterminate. MRI of the abdomen with and without contrast recommended to further evaluate as metastatic disease or primary hepatic neoplasm not excluded. 2. Gallbladder is distended with features suggesting ill-defined gallbladder wall thickening on this noncontrast study. No calcified gallstones evident. There is edema/fluid in the region the gallbladder fossa and hepatoduodenal ligament. Imaging features are nonspecific but could be seen in the setting of acute cholecystitis. Right upper quadrant ultrasound may prove helpful to further evaluate. 3. Edema/inflammation is identified along the distal ascending colon and hepatic flexure, potentially secondary to changes described above in the gallbladder fossa. No associated colonic wall thickening to suggest colon represents etiology of these findings, but colitis is not excluded. 4. Small volume perihepatic free fluid with question of periportal edema. 5. Left colonic diverticulosis without diverticulitis. 6. Uterine fibroids. 7.  Aortic Atherosclerosis (ICD10-I70.0). Electronically Signed   By: Camellia Candle M.D.   On: 10/13/2024 06:41   DG Chest Port 1 View Result Date: 10/13/2024 CLINICAL DATA:  Possible sepsis. EXAM: PORTABLE CHEST 1 VIEW COMPARISON:  None Available. FINDINGS: The lungs are clear without focal pneumonia, edema,  pneumothorax or pleural effusion. Biapical pleuroparenchymal scarring evident. Cardiopericardial silhouette is at upper limits of normal for size. No acute bony abnormality. Telemetry leads overlie the chest. IMPRESSION: No active disease. Electronically Signed   By: Camellia Candle M.D.   On: 10/13/2024 05:46      Assessment/Plan Epigastric abdominal pain Acute pancreatitis  Possible liver masses noted on CT - CT today with subtle lesion in right liver with additional small lesions toward dome of liver, distended gallbladder with pericholecystic fluid and questionable gallbladder wall thickening, no gallstones, inflammation/edema along the distal ascending colon and hepatic flexure without colonic wall thickening , small volume perihepatic free fluid with questionable periportal edema  - RUQ US  today with gallbladder wall borderline thickening, no gallstones, no sonographic murphy sign, pericholecystic fluid present, CBD dilated to 8 mm, liver heterogeneous and echogenic, liver lesions not readily evident  - WBC 17.4, lactic initially 2.7 but normalized to 1.8 after fluids - lipase >2800, AST/ALT 293/102, Tbili 1.7 - pt has no RUQ pain really on exam to suggest cholecystitis  - recommend GI consult to evaluate as well - may need MRCP or HIDA - no acute surgical intervention planned at this time but we will follow   Admit to medical service  FEN: NPO, IVF per admitting VTE: ok to have SQH or LMWH from surgical standpoint ID: [email or web address] given in ED  - per TRH -  HTN HLD CKD  stage IIIb   I reviewed ED provider notes, last 24 h vitals and pain scores, last 48 h intake and output, last 24 h labs and trends, and last 24 h imaging results.  This care required high  level of medical decision making.   Burnard JONELLE Louder, [facility] 10/13/2024, 11:01 AM Please see Amion for pager number during day hours 7:00am-4:30pm

## 2024-10-13 NOTE — ED Notes (Signed)
 Patient transported to Ultrasound

## 2024-10-13 NOTE — ED Notes (Signed)
 Phlebotomy notified of blood draw order. Attempted to draw off IV x2 unsuccessfully.

## 2024-10-13 NOTE — ED Notes (Signed)
 Pt. Put back on the monitor, told the pt. To hit the call button when she feels the need to get up

## 2024-10-13 NOTE — Plan of Care (Signed)
   Problem: Education: Goal: Knowledge of General Education information will improve Description Including pain rating scale, medication(s)/side effects and non-pharmacologic comfort measures Outcome: Progressing   Problem: Health Behavior/Discharge Planning: Goal: Ability to manage health-related needs will improve Outcome: Progressing

## 2024-10-13 NOTE — ED Notes (Signed)
 Phlebotomy at [facility] attempting Ammonia

## 2024-10-14 ENCOUNTER — Inpatient Hospital Stay (HOSPITAL_COMMUNITY)

## 2024-10-14 DIAGNOSIS — K828 Other specified diseases of gallbladder: Secondary | ICD-10-CM | POA: Diagnosis present

## 2024-10-14 DIAGNOSIS — R7989 Other specified abnormal findings of blood chemistry: Secondary | ICD-10-CM | POA: Diagnosis present

## 2024-10-14 DIAGNOSIS — K769 Liver disease, unspecified: Secondary | ICD-10-CM | POA: Diagnosis not present

## 2024-10-14 DIAGNOSIS — R7401 Elevation of levels of liver transaminase levels: Secondary | ICD-10-CM | POA: Diagnosis not present

## 2024-10-14 DIAGNOSIS — K859 Acute pancreatitis without necrosis or infection, unspecified: Secondary | ICD-10-CM | POA: Diagnosis not present

## 2024-10-14 DIAGNOSIS — G9341 Metabolic encephalopathy: Secondary | ICD-10-CM | POA: Diagnosis present

## 2024-10-14 DIAGNOSIS — R109 Unspecified abdominal pain: Secondary | ICD-10-CM | POA: Diagnosis not present

## 2024-10-14 DIAGNOSIS — D72829 Elevated white blood cell count, unspecified: Secondary | ICD-10-CM | POA: Diagnosis present

## 2024-10-14 LAB — COMPREHENSIVE METABOLIC PANEL WITH GFR
ALT: 225 U/L — ABNORMAL HIGH (ref 0–44)
AST: 372 U/L — ABNORMAL HIGH (ref 15–41)
Albumin: 3 g/dL — ABNORMAL LOW (ref 3.5–5.0)
Alkaline Phosphatase: 62 U/L (ref 38–126)
Anion gap: 12 (ref 5–15)
BUN: 21 mg/dL (ref 8–23)
CO2: 17 mmol/L — ABNORMAL LOW (ref 22–32)
Calcium: 8.8 mg/dL — ABNORMAL LOW (ref 8.9–10.3)
Chloride: 111 mmol/L (ref 98–111)
Creatinine, Ser: 1.79 mg/dL — ABNORMAL HIGH (ref 0.44–1.00)
GFR, Estimated: 27 mL/min — ABNORMAL LOW (ref >60)
Glucose, Bld: 214 mg/dL — ABNORMAL HIGH (ref 70–99)
Potassium: 4 mmol/L (ref 3.5–5.1)
Sodium: 140 mmol/L (ref 135–145)
Total Bilirubin: 2 mg/dL — ABNORMAL HIGH (ref 0.0–1.2)
Total Protein: 5.7 g/dL — ABNORMAL LOW (ref 6.5–8.1)

## 2024-10-14 LAB — BLOOD CULTURE ID PANEL (REFLEXED) - BCID2

## 2024-10-14 LAB — HEPATITIS PANEL, ACUTE
HCV Ab: NONREACTIVE
Hep A IgM: NONREACTIVE
Hep B C IgM: NONREACTIVE
Hepatitis B Surface Ag: NONREACTIVE

## 2024-10-14 LAB — TSH: TSH: 1.241 u[IU]/mL (ref 0.350–4.500)

## 2024-10-14 LAB — CBC
HCT: 31.8 % — ABNORMAL LOW (ref 36.0–46.0)
Hemoglobin: 10 g/dL — ABNORMAL LOW (ref 12.0–15.0)
MCH: 25.6 pg — ABNORMAL LOW (ref 26.0–34.0)
MCHC: 31.4 g/dL (ref 30.0–36.0)
MCV: 81.5 fL (ref 80.0–100.0)
Platelets: 131 K/uL — ABNORMAL LOW (ref 150–400)
RBC: 3.9 MIL/uL (ref 3.87–5.11)
RDW: 15.9 % — ABNORMAL HIGH (ref 11.5–15.5)
WBC: 14.1 K/uL — ABNORMAL HIGH (ref 4.0–10.5)
nRBC: 0 % (ref 0.0–0.2)

## 2024-10-14 LAB — URINE CULTURE

## 2024-10-14 LAB — VITAMIN B12: Vitamin B-12: 481 pg/mL (ref 180–914)

## 2024-10-14 LAB — MAGNESIUM: Magnesium: 1.6 mg/dL — ABNORMAL LOW (ref 1.7–2.4)

## 2024-10-14 MED ORDER — GADOBUTROL 1 MMOL/ML IV SOLN
5.0000 mL | Freq: Once | INTRAVENOUS | Status: AC | PRN
  Administered 2024-10-14: 5 mL via INTRAVENOUS

## 2024-10-14 MED ORDER — PIPERACILLIN-TAZOBACTAM IN DEX 2-0.25 GM/50ML IV SOLN
2.2500 g | Freq: Three times a day (TID) | INTRAVENOUS | Status: DC
  Administered 2024-10-14 – 2024-10-15 (×4): 2.25 g via INTRAVENOUS
  Filled 2024-10-14 (×5): qty 50

## 2024-10-14 MED ORDER — MAGNESIUM SULFATE 4 GM/100ML IV SOLN
4.0000 g | Freq: Once | INTRAVENOUS | Status: AC
  Administered 2024-10-14: 4 g via INTRAVENOUS
  Filled 2024-10-14: qty 100

## 2024-10-14 MED ORDER — ONDANSETRON HCL 4 MG PO TABS: 4.0000 mg | ORAL_TABLET | Freq: Four times a day (QID) | ORAL | Status: DC | PRN

## 2024-10-14 MED ORDER — ONDANSETRON HCL 4 MG/2ML IJ SOLN: 4.0000 mg | Freq: Four times a day (QID) | INTRAMUSCULAR | Status: DC | PRN

## 2024-10-14 MED ORDER — ENOXAPARIN SODIUM 30 MG/0.3ML IJ SOSY
30.0000 mg | PREFILLED_SYRINGE | Freq: Every day | INTRAMUSCULAR | Status: DC
  Administered 2024-10-14 – 2024-10-15 (×2): 30 mg via SUBCUTANEOUS
  Filled 2024-10-14 (×2): qty 0.3

## 2024-10-14 MED ORDER — ALBUTEROL SULFATE (2.5 MG/3ML) 0.083% IN NEBU: 2.5000 mg | INHALATION_SOLUTION | Freq: Four times a day (QID) | RESPIRATORY_TRACT | Status: DC | PRN

## 2024-10-14 NOTE — Care Management Obs Status (Signed)
 MEDICARE OBSERVATION STATUS NOTIFICATION   Patient Details  Name: Haley Randall MRN: [id number] Date of Birth: [birth year]   Medicare Observation Status Notification Given:  Yes Patient was not able to sign but advised me to Call her daughter [name] at [phone]  I advised verbally of the notice    Claretta Deed 10/14/2024, 9:25 AM

## 2024-10-14 NOTE — Evaluation (Signed)
 Occupational Therapy Evaluation Patient Details Name: Haley Randall MRN: [id number] DOB: 03-Feb-1936 Today's Date: 10/14/2024   History of Present Illness   88 y.o. female adm 10/13/24 with medical history significant of hypertension, hyperlipidemia, and gout presents with abdominal cramps.  Admitted for abdominal cramps.     Clinical Impressions Patient admitted for the diagnosis above.  PTA she lives at home with her daughter, and states she is independent with mobility and ADL completion.  Mild cognitive deficits noted.  Patient is largely supervision for in room mobility/toileting and ADL completion from a sit to stand level.  OT will follow in the acute setting to encourage OOB and mobility, but no post acute OT is anticipated.       If plan is discharge home, recommend the following:   Assist for transportation;Assistance with cooking/housework;Supervision due to cognitive status     Functional Status Assessment   Patient has had a recent decline in their functional status and demonstrates the ability to make significant improvements in function in a reasonable and predictable amount of time.     Equipment Recommendations   None recommended by OT     Recommendations for Other Services         Precautions/Restrictions   Precautions Precautions: Fall Recall of Precautions/Restrictions: Impaired Precaution/Restrictions Comments: Dance Movement Psychotherapist Overal bed mobility: Needs Assistance Bed Mobility: Supine to Sit     Supine to sit: Supervision          Transfers Overall transfer level: Needs assistance   Transfers: Sit to/from Stand, Bed to chair/wheelchair/BSC Sit to Stand: Supervision     Step pivot transfers: Supervision            Balance Overall balance assessment: Needs assistance Sitting-balance support: Feet supported Sitting balance-Leahy Scale: Good     Standing balance support: Single extremity supported, No upper  extremity supported Standing balance-Leahy Scale: Fair                             ADL either performed or assessed with clinical judgement   ADL       Grooming: Supervision/safety               Lower Body Dressing: Supervision/safety   Toilet Transfer: Supervision/safety                   Vision Patient Visual Report: No change from baseline       Perception Perception: Not tested       Praxis Praxis: Not tested       Pertinent Vitals/Pain Pain Assessment Pain Assessment: Faces Faces Pain Scale: Hurts a little bit Pain Location: abdomen Pain Descriptors / Indicators: Aching Pain Intervention(s): Monitored during session     Extremity/Trunk Assessment Upper Extremity Assessment Upper Extremity Assessment: Overall WFL for tasks assessed   Lower Extremity Assessment Lower Extremity Assessment: Defer to PT evaluation   Cervical / Trunk Assessment Cervical / Trunk Assessment: Normal   Communication Communication Communication: No apparent difficulties   Cognition Arousal: Alert Behavior During Therapy: WFL for tasks assessed/performed Cognition: History of cognitive impairments             OT - Cognition Comments: Forgetfulness and ST Memory deficits.                 Following commands: Intact       Cueing  General Comments   Cueing Techniques: Verbal cues  Exercises     Shoulder Instructions      Home Living Family/patient expects to be discharged to:: Private residence Living Arrangements: Children Available Help at Discharge: Family;Available PRN/intermittently Type of Home: House Home Access: Stairs to enter     Home Layout: Two level;Bed/bath upstairs   Alternate Level Stairs-Rails: Right;Left;Can reach both Bathroom Shower/Tub: Producer, Television/film/video: Standard     Home Equipment: Agricultural Consultant (2 wheels);Shower seat - built in;Cane - single point   Additional Comments: RW is  spouses      Prior Functioning/Environment Prior Level of Function : Independent/Modified Independent               ADLs Comments: Patient states she still drives    OT Problem List: Impaired balance (sitting and/or standing)   OT Treatment/Interventions: Self-care/ADL training;Therapeutic activities;Balance training      OT Goals(Current goals can be found in the care plan section)   Acute Rehab OT Goals Patient Stated Goal: Return home OT Goal Formulation: With patient Time For Goal Achievement: 10/28/24 Potential to Achieve Goals: Good ADL Goals Pt Will Perform Grooming: with modified independence;standing Pt Will Perform Lower Body Dressing: with modified independence;sit to/from stand Pt Will Transfer to Toilet: with modified independence;regular height toilet;ambulating   OT Frequency:  Min 2X/week    Co-evaluation PT/OT/SLP Co-Evaluation/Treatment: Yes Reason for Co-Treatment: Complexity of the patient's impairments (multi-system involvement)   OT goals addressed during session: ADL's and self-care      AM-PAC OT 6 Clicks Daily Activity     Outcome Measure Help from another person eating meals?: None Help from another person taking care of personal grooming?: A Little Help from another person toileting, which includes using toliet, bedpan, or urinal?: A Little Help from another person bathing (including washing, rinsing, drying)?: A Little Help from another person to put on and taking off regular upper body clothing?: None Help from another person to put on and taking off regular lower body clothing?: A Little 6 Click Score: 20   End of Session Nurse Communication: Mobility status  Activity Tolerance: Patient tolerated treatment well Patient left: in chair;with call bell/phone within reach;with chair alarm set;with nursing/sitter in room  OT Visit Diagnosis: Unsteadiness on feet (R26.81)                Time: 9072-9049 OT Time Calculation (min): 23  min Charges:  OT General Charges $OT Visit: 1 Visit OT Evaluation $OT Eval Moderate Complexity: 1 Mod  10/14/2024  RP, OTR/L  Acute Rehabilitation Services  Office:  [phone]   Charlie JONETTA Halsted 10/14/2024, 10:15 AM

## 2024-10-14 NOTE — Care Management (Signed)
 Transition of Care [facility]) - Inpatient Brief Assessment   Patient Details  Name: Cantrell Martus MRN: [id number] Date of Birth: 10-25-1936  Transition of Care [facility] CM/SW Contact:    Corean JAYSON Canary, RN Phone Number: 10/14/2024, 9:58 AM   Clinical Narrative:  88 year old patient lives with spouse. Hx of HPTN, Hyperlipidemia, gout memory issues. She stated she was not taking medication anymore at home.  She is confused , Lipase elevated. Surgery consulted, no intervention pending. Workup for abd pain ongoing. Recommend palliative consult for goals of care  IPCM will continue to follow for needs  Transition of Care Asessment: Insurance and Status: Insurance coverage has been reviewed Patient has primary care physician: Yes Home environment has been reviewed: Lives with spouse Prior level of function:: Independent Prior/Current Home Services: No current home services Social Drivers of Health Review: SDOH reviewed needs interventions Readmission risk has been reviewed: Yes Transition of care needs: transition of care needs identified, TOC will continue to follow

## 2024-10-14 NOTE — Progress Notes (Signed)
 PHARMACY NOTE:  ANTIMICROBIAL RENAL DOSAGE ADJUSTMENT  Current antimicrobial regimen includes a mismatch between antimicrobial dosage and estimated renal function.  As per policy approved by the Pharmacy & Therapeutics and Medical Executive Committees, the antimicrobial dosage will be adjusted accordingly.  Current antimicrobial dosage:  Zosyn 3.375g IV q8  Indication: intra-ab infection  Renal Function:  Estimated Creatinine Clearance: 16.7 mL/min (A) (by C-G formula based on SCr of 1.79 mg/dL (H)). []      On intermittent HD, scheduled: []      On CRRT    Antimicrobial dosage has been changed to:  Zosyn 2.25g IV q8  Additional comments:   Sergio Batch, PharmD, BCIDP, AAHIVP, CPP Infectious Disease Pharmacist 10/14/2024 10:25 AM

## 2024-10-14 NOTE — Progress Notes (Signed)
 PHARMACY - PHYSICIAN COMMUNICATION CRITICAL VALUE ALERT - BLOOD CULTURE IDENTIFICATION (BCID)  Haley Randall is an 88 y.o. female who presented to [facility] on 10/13/2024 with a chief complaint of abdominal pain  Assessment:  Zosyn started for possible GI infection.  Workup in progress.  Laboratory reports that one blood culture bottle is positive for GPC in clusters, BCID with staph epi, no resistance detected.  Suspect contamination.  Name of physician (or Provider) Contacted: Dr. Raenelle notified  Current antibiotics: zosyn  Changes to prescribed antibiotics recommended:  Patient is on recommended antibiotics - No changes needed  Results for orders placed or performed during the hospital encounter of 10/13/24  Blood Culture ID Panel (Reflexed) (Collected: 10/13/2024  5:17 AM)  Result Value Ref Range   Enterococcus faecalis NOT DETECTED NOT DETECTED   Enterococcus Faecium NOT DETECTED NOT DETECTED   Listeria monocytogenes NOT DETECTED NOT DETECTED   Staphylococcus species DETECTED (A) NOT DETECTED   Staphylococcus aureus (BCID) NOT DETECTED NOT DETECTED   Staphylococcus epidermidis DETECTED (A) NOT DETECTED   Staphylococcus lugdunensis NOT DETECTED NOT DETECTED   Streptococcus species NOT DETECTED NOT DETECTED   Streptococcus agalactiae NOT DETECTED NOT DETECTED   Streptococcus pneumoniae NOT DETECTED NOT DETECTED   Streptococcus pyogenes NOT DETECTED NOT DETECTED   A.calcoaceticus-baumannii NOT DETECTED NOT DETECTED   Bacteroides fragilis NOT DETECTED NOT DETECTED   Enterobacterales NOT DETECTED NOT DETECTED   Enterobacter cloacae complex NOT DETECTED NOT DETECTED   Escherichia coli NOT DETECTED NOT DETECTED   Klebsiella aerogenes NOT DETECTED NOT DETECTED   Klebsiella oxytoca NOT DETECTED NOT DETECTED   Klebsiella pneumoniae NOT DETECTED NOT DETECTED   Proteus species NOT DETECTED NOT DETECTED   Salmonella species NOT DETECTED NOT DETECTED   Serratia marcescens NOT DETECTED  NOT DETECTED   Haemophilus influenzae NOT DETECTED NOT DETECTED   Neisseria meningitidis NOT DETECTED NOT DETECTED   Pseudomonas aeruginosa NOT DETECTED NOT DETECTED   Stenotrophomonas maltophilia NOT DETECTED NOT DETECTED   Candida albicans NOT DETECTED NOT DETECTED   Candida auris NOT DETECTED NOT DETECTED   Candida glabrata NOT DETECTED NOT DETECTED   Candida krusei NOT DETECTED NOT DETECTED   Candida parapsilosis NOT DETECTED NOT DETECTED   Candida tropicalis NOT DETECTED NOT DETECTED   Cryptococcus neoformans/gattii NOT DETECTED NOT DETECTED   Methicillin resistance mecA/C NOT DETECTED NOT DETECTED    Celestia Venetia Rush 10/14/2024  10:15 AM

## 2024-10-14 NOTE — Evaluation (Signed)
 Physical Therapy Evaluation Patient Details Name: Haley Randall MRN: [id number] DOB: Jun 17, 1936 Today's Date: 10/14/2024  History of Present Illness  88 y.o. female adm 10/13/24 with medical history significant of hypertension, hyperlipidemia, and gout presents with abdominal cramps.  Admitted for abdominal cramps.  Clinical Impression  Pt presents with admitting diagnosis above. Co-treat with OT. Pt today was able to ambulate in hallway with supervision/CGA and no AD. PTA pt was fully independent. Anticipate pt is fairly close to baseline. 1 additional PT visit for stair training prior to DC then likely sign off. Pt would benefit from continued mobility with mobility specialist during acute stay. Pt has no post acute PT needs.      If plan is discharge home, recommend the following: A little help with walking and/or transfers;A little help with bathing/dressing/bathroom;Assistance with cooking/housework;Help with stairs or ramp for entrance;Assist for transportation   Can travel by private vehicle        Equipment Recommendations None recommended by PT  Recommendations for Other Services       Functional Status Assessment Patient has had a recent decline in their functional status and demonstrates the ability to make significant improvements in function in a reasonable and predictable amount of time.     Precautions / Restrictions Precautions Precautions: Fall Recall of Precautions/Restrictions: Impaired Precaution/Restrictions Comments: Public Relations Account Executive Weight Bearing Restrictions Per Provider Order: No      Mobility  Bed Mobility Overal bed mobility: Needs Assistance Bed Mobility: Supine to Sit     Supine to sit: Supervision          Transfers Overall transfer level: Needs assistance Equipment used: None Transfers: Sit to/from Stand, Bed to chair/wheelchair/BSC Sit to Stand: Supervision                Ambulation/Gait Ambulation/Gait assistance:  Supervision, Contact guard assist Gait Distance (Feet): 150 Feet Assistive device: None Gait Pattern/deviations: Narrow base of support, Scissoring, Decreased stride length, Step-through pattern Gait velocity: decreased     General Gait Details: Mostly slowed cautious gait pattern with occassional scissoring noted.  Stairs            Wheelchair Mobility     Tilt Bed    Modified Rankin (Stroke Patients Only)       Balance Overall balance assessment: Needs assistance Sitting-balance support: Feet supported Sitting balance-Leahy Scale: Good     Standing balance support: Single extremity supported, No upper extremity supported Standing balance-Leahy Scale: Fair                               Pertinent Vitals/Pain Pain Assessment Pain Assessment: Faces Faces Pain Scale: Hurts a little bit Pain Location: abdomen Pain Descriptors / Indicators: Aching    Home Living Family/patient expects to be discharged to:: Private residence Living Arrangements: Children Available Help at Discharge: Family;Available PRN/intermittently Type of Home: House Home Access: Stairs to enter     Alternate Level Stairs-Number of Steps: flight Home Layout: Two level;Bed/bath upstairs Home Equipment: Agricultural Consultant (2 wheels);Shower seat - built in;Cane - single point Additional Comments: RW is spouses    Prior Function Prior Level of Function : Independent/Modified Independent             Mobility Comments: Pt reports ind ADLs Comments: Patient states she still drives     Extremity/Trunk Assessment   Upper Extremity Assessment Upper Extremity Assessment: Overall WFL for tasks assessed    Lower Extremity Assessment Lower Extremity  Assessment: Overall WFL for tasks assessed    Cervical / Trunk Assessment Cervical / Trunk Assessment: Normal  Communication   Communication Communication: No apparent difficulties    Cognition Arousal: Alert Behavior During  Therapy: WFL for tasks assessed/performed                             Following commands: Intact       Cueing Cueing Techniques: Verbal cues     General Comments General comments (skin integrity, edema, etc.): VSS    Exercises     Assessment/Plan    PT Assessment Patient needs continued PT services  PT Problem List Decreased strength;Decreased range of motion;Decreased activity tolerance;Decreased balance;Decreased mobility;Decreased coordination;Decreased cognition;Decreased knowledge of use of DME;Decreased safety awareness;Decreased knowledge of precautions;Cardiopulmonary status limiting activity       PT Treatment Interventions DME instruction;Gait training;Stair training;Functional mobility training;Therapeutic activities;Therapeutic exercise;Balance training;Neuromuscular re-education;Cognitive remediation;Patient/family education;Wheelchair mobility training    PT Goals (Current goals can be found in the Care Plan section)  Acute Rehab PT Goals Patient Stated Goal: to go home PT Goal Formulation: With patient Time For Goal Achievement: 10/28/24 Potential to Achieve Goals: Good    Frequency Min 2X/week     Co-evaluation PT/OT/SLP Co-Evaluation/Treatment: Yes Reason for Co-Treatment: Complexity of the patient's impairments (multi-system involvement) PT goals addressed during session: Mobility/safety with mobility;Proper use of DME OT goals addressed during session: ADL's and self-care       AM-PAC PT 6 Clicks Mobility  Outcome Measure Help needed turning from your back to your side while in a flat bed without using bedrails?: A Little Help needed moving from lying on your back to sitting on the side of a flat bed without using bedrails?: A Little Help needed moving to and from a bed to a chair (including a wheelchair)?: A Little Help needed standing up from a chair using your arms (e.g., wheelchair or bedside chair)?: A Little Help needed to walk  in hospital room?: A Little Help needed climbing 3-5 steps with a railing? : A Little 6 Click Score: 18    End of Session Equipment Utilized During Treatment: Gait belt Activity Tolerance: Patient tolerated treatment well Patient left: in chair;with call bell/phone within reach;with chair alarm set;with nursing/sitter in room Nurse Communication: Mobility status PT Visit Diagnosis: Other abnormalities of gait and mobility (R26.89)    Time: 9073-9053 PT Time Calculation (min) (ACUTE ONLY): 20 min   Charges:   PT Evaluation $PT Eval Moderate Complexity: 1 Mod   PT General Charges $$ ACUTE PT VISIT: 1 Visit         Haley Randall, PT, DPT Acute Rehab Services [phone]   Haley Randall 10/14/2024, 12:26 PM

## 2024-10-14 NOTE — Progress Notes (Signed)
 Progress Note     Subjective: Pt reports some lower abdominal pain. Denies nausea and vomiting. Unsure if she is passing flatus this AM.  Objective: Vital signs in last 24 hours: Temp:  [98.2 F (36.8 C)-99.3 F (37.4 C)] 99 F (37.2 C) (11/14 0815) Pulse Rate:  [72-97] 73 (11/14 0815) Resp:  [11-30] 18 (11/14 0815) BP: (115-241)/(44-109) 170/67 (11/14 0815) SpO2:  [96 %-100 %] 97 % (11/14 0815) Weight:  [52.3 kg] 52.3 kg (11/14 0815) Last BM Date : 10/13/24  Intake/Output from previous day: 11/13 0701 - 11/14 0700 In: 780 [P.O.:280; IV Piggyback:500] Out: 600 [Blood:600] Intake/Output this shift: No intake/output data recorded.  PE: General: pleasant, WD, WN elderly female who is laying in bed in NAD HEENT:  Sclera are anicteric.  Heart: regular, rate, and rhythm. Lungs: Respiratory effort nonlabored Abd: soft, mild ttp in epigastric abdomen, no RUQ TTP on exam and negative Murphy sign, ND, no masses, hernias, or organomegaly    Lab Results:  Recent Labs    10/13/24 0517 10/13/24 0518 10/14/24 0237  WBC 17.4*  --  14.1*  HGB 10.8* 11.6* 10.0*  HCT 34.6* 34.0* 31.8*  PLT 209  --  131*   BMET Recent Labs    10/13/24 0517 10/13/24 0518 10/14/24 0237  NA 140 141 140  K 3.9 3.9 4.0  CL 108 110 111  CO2 19*  --  17*  GLUCOSE 201* 203* 214*  BUN 18 18 21   CREATININE 1.44* 1.40* 1.79*  CALCIUM  9.4  --  8.8*   PT/INR Recent Labs    10/13/24 0517  LABPROT 14.3  INR 1.1   CMP     Component Value Date/Time   NA 140 10/14/2024 0237   NA 143 09/07/2023 1005   K 4.0 10/14/2024 0237   CL 111 10/14/2024 0237   CO2 17 (L) 10/14/2024 0237   GLUCOSE 214 (H) 10/14/2024 0237   BUN 21 10/14/2024 0237   BUN 15 09/07/2023 1005   CREATININE 1.79 (H) 10/14/2024 0237   CALCIUM  8.8 (L) 10/14/2024 0237   PROT 5.7 (L) 10/14/2024 0237   PROT 7.0 09/01/2023 0927   ALBUMIN 3.0 (L) 10/14/2024 0237   ALBUMIN 4.4 09/01/2023 0927   AST 372 (H) 10/14/2024 0237    ALT 225 (H) 10/14/2024 0237   ALKPHOS 62 10/14/2024 0237   BILITOT 2.0 (H) 10/14/2024 0237   BILITOT 0.8 09/01/2023 0927   GFRNONAA 27 (L) 10/14/2024 0237   Lipase     Component Value Date/Time   LIPASE >2,800 (H) 10/13/2024 0517       Studies/Results: US  Abdomen Limited RUQ (LIVER/GB) Result Date: 10/13/2024 CLINICAL DATA:  Right upper quadrant abdominal pain. EXAM: ULTRASOUND ABDOMEN LIMITED RIGHT UPPER QUADRANT COMPARISON:  None Available. FINDINGS: Gallbladder: Gallbladder wall is upper normal to borderline thickened at 3.1 mm. No echogenic gallstones evident. Pericholecystic fluid evident. Sonographer reports no sonographic Murphy sign. Common bile duct: Diameter: 8 mm Liver: Liver lesion seen on CT earlier today not readily evident by ultrasound. Liver appears heterogeneous and echogenic. Portal vein is patent on color Doppler imaging with normal direction of blood flow towards the liver. Other: None. IMPRESSION: 1. Gallbladder wall is upper normal to borderline thickened at 3.1 mm. No echogenic gallstones evident and no sonographic Murphy sign. As on recent CT, pericholecystic fluid noted. Cholecystitis is not excluded. Correlation for signs/symptoms of cholecystitis recommended. Nuclear scintigraphy may prove helpful to further evaluate as clinically warranted. 2. Common bile duct is dilated at 8  mm. Correlation with liver function test recommended. 3. Liver appears heterogeneous and echogenic. This is a nonspecific finding but can be seen in the setting of hepatic steatosis. 4. Liver lesions seen on CT earlier today not readily evident by ultrasound. Electronically Signed   By: Camellia Candle M.D.   On: 10/13/2024 07:27   CT ABDOMEN PELVIS WO CONTRAST Result Date: 10/13/2024 CLINICAL DATA:  Abdominal pain with nausea. EXAM: CT ABDOMEN AND PELVIS WITHOUT CONTRAST TECHNIQUE: Multidetector CT imaging of the abdomen and pelvis was performed following the standard protocol without IV  contrast. RADIATION DOSE REDUCTION: This exam was performed according to the departmental dose-optimization program which includes automated exposure control, adjustment of the mA and/or kV according to patient size and/or use of iterative reconstruction technique. COMPARISON:  None Available. FINDINGS: Lower chest: Calcified granuloma noted left lower lobe. Hepatobiliary: Small volume perihepatic free fluid with findings suspicious for periportal edema although assessment is limited by lack of intravenous contrast material. 4.8 x 3.1 cm subtle lesion identified right liver on image 15 of series 3. Additional smaller lesions seen towards the dome of the liver measuring up to 2.1 cm on image 9 of series 3. Gallbladder is distended with features suggesting gallbladder wall thickening. No calcified gallstones evident. There is edema/fluid in the region the gallbladder fossa and hepatoduodenal ligament. Although assessment limited by lack of intravenous contrast material, no gross dilatation of the common bile duct. Pancreas: No definite pancreatic mass.  No main duct dilatation. Spleen: No splenomegaly. No suspicious focal mass lesion. Adrenals/Urinary Tract: No adrenal nodule or mass. Right kidney unremarkable. Low-density lesions in the left kidney approach water density and are likely cysts. No evidence for hydroureter. Bladder unremarkable. Stomach/Bowel: Stomach is unremarkable. No gastric wall thickening. No evidence of outlet obstruction. Duodenum passes through the area of edema/inflammatory change of the right upper quadrant. Duodenum does not cross the midline as expected suggesting a component of small bowel malrotation. No small bowel wall thickening. No small bowel dilatation. Despite the course of the duodenum, the cecum is positioned in the right lower quadrant. The terminal ileum is normal. The appendix is normal. There is some fluid adjacent to the distal ascending colon and hepatic flexure.  Diverticular disease is noted in the left colon without diverticulitis. Vascular/Lymphatic: There is advanced atherosclerotic calcification of the abdominal aorta without aneurysm. No abdominal lymphadenopathy. No pelvic lymphadenopathy. Reproductive: Uterine fibroids evident.  There is no adnexal mass. Other: No substantial free fluid in the pelvis. Musculoskeletal: No worrisome lytic or sclerotic osseous abnormality. IMPRESSION: 1. 4.8 x 3.1 cm subtle lesion identified right liver with additional smaller lesions seen towards the dome of the liver measuring up to 2.1 cm. Imaging features are indeterminate. MRI of the abdomen with and without contrast recommended to further evaluate as metastatic disease or primary hepatic neoplasm not excluded. 2. Gallbladder is distended with features suggesting ill-defined gallbladder wall thickening on this noncontrast study. No calcified gallstones evident. There is edema/fluid in the region the gallbladder fossa and hepatoduodenal ligament. Imaging features are nonspecific but could be seen in the setting of acute cholecystitis. Right upper quadrant ultrasound may prove helpful to further evaluate. 3. Edema/inflammation is identified along the distal ascending colon and hepatic flexure, potentially secondary to changes described above in the gallbladder fossa. No associated colonic wall thickening to suggest colon represents etiology of these findings, but colitis is not excluded. 4. Small volume perihepatic free fluid with question of periportal edema. 5. Left colonic diverticulosis without diverticulitis. 6. Uterine  fibroids. 7.  Aortic Atherosclerosis (ICD10-I70.0). Electronically Signed   By: Camellia Candle M.D.   On: 10/13/2024 06:41   DG Chest Port 1 View Result Date: 10/13/2024 CLINICAL DATA:  Possible sepsis. EXAM: PORTABLE CHEST 1 VIEW COMPARISON:  None Available. FINDINGS: The lungs are clear without focal pneumonia, edema, pneumothorax or pleural effusion.  Biapical pleuroparenchymal scarring evident. Cardiopericardial silhouette is at upper limits of normal for size. No acute bony abnormality. Telemetry leads overlie the chest. IMPRESSION: No active disease. Electronically Signed   By: Camellia Candle M.D.   On: 10/13/2024 05:46    Anti-infectives: Anti-infectives (From admission, onward)    Start     Dose/Rate Route Frequency Ordered Stop   10/14/24 1400  piperacillin-tazobactam (ZOSYN) IVPB 2.25 g        2.25 g 100 mL/hr over 30 Minutes Intravenous Every 8 hours 10/14/24 1024     10/13/24 1400  piperacillin-tazobactam (ZOSYN) IVPB 3.375 g  Status:  Discontinued        3.375 g 100 mL/hr over 30 Minutes Intravenous Every 8 hours 10/13/24 1036 10/13/24 1038   10/13/24 1400  piperacillin-tazobactam (ZOSYN) IVPB 3.375 g  Status:  Discontinued        3.375 g 12.5 mL/hr over 240 Minutes Intravenous Every 8 hours 10/13/24 1039 10/14/24 1024   10/13/24 1045  piperacillin-tazobactam (ZOSYN) IVPB 3.375 g  Status:  Discontinued        3.375 g 12.5 mL/hr over 240 Minutes Intravenous Every 8 hours 10/13/24 1038 10/13/24 1039   10/13/24 0530  piperacillin-tazobactam (ZOSYN) IVPB 3.375 g        3.375 g 100 mL/hr over 30 Minutes Intravenous  Once 10/13/24 0525 10/13/24 [id number]        Assessment/Plan Epigastric abdominal pain Acute pancreatitis  Possible liver masses noted on CT - CT 11/13 with subtle lesion in right liver with additional small lesions toward dome of liver, distended gallbladder with pericholecystic fluid and questionable gallbladder wall thickening, no gallstones, inflammation/edema along the distal ascending colon and hepatic flexure without colonic wall thickening , small volume perihepatic free fluid with questionable periportal edema  - RUQ US  11/13 with gallbladder wall borderline thickening, no gallstones, no sonographic murphy sign, pericholecystic fluid present, CBD dilated to 8 mm, liver heterogeneous and echogenic, liver lesions  not readily evident  - WBC 14K from 17K, HD stable and afebrile  - lipase >2800 on admit, not checked today - AST/ALT 372/225 from 293/102, Tbili 2.0 from 1.7 - MRCP ordered by primary team, continue to recommend GI consult as well  - no acute surgical intervention planned at this time but we will follow     FEN: CLD, IVF per [facility] VTE: LMWH ID: [id number] 11/13>> does not have cholecystitis on exam    - per [facility] -  HTN HLD CKD stage IIIb   LOS: 0 days   I reviewed nursing notes, hospitalist notes, last 24 h vitals and pain scores, last 48 h intake and output, last 24 h labs and trends, and last 24 h imaging results.  This care required moderate level of medical decision making.    Burnard JONELLE Louder, [facility] 10/14/2024, 10:32 AM Please see Amion for pager number during day hours 7:00am-4:30pm

## 2024-10-14 NOTE — Progress Notes (Addendum)
 PROGRESS NOTE        PATIENT DETAILS Name: Haley Randall Age: 88 y.o. Sex: female Date of Birth: [birth year] Admit Date: 10/13/2024 Admitting Physician Maximino DELENA Sharps, MD ERE:Ilhjo, Ginger, FNP  Brief Summary: Patient is a 88 y.o.  female with history of HTN, HLD-who presented with abdominal pain-found to have pancreatitis/vague liver lesions on CT abdomen.  Significant events: 11/13>> admit to [facility]  Significant studies: 11/13>> CXR: No PNA 11/13>> CT abdomen/pelvis: 4.8 x 3.1 cm subtle lesion-right liver-additional small lesions measuring up to 2.1 cm.  Gallbladder distended.  Edema/inflammation along the descending ascending colon/hepatic flexure. 11/13>> RUQ ultrasound: Gallbladder wall upper normal to borderline thickened, no echogenic gallstones.  CBD 8 mm.  Significant microbiology data: 11/13>> COVID/influenza/RSV PCR: Negative 11/13>> urine culture: Multiple species 11/13>> 1/2: Gram-positive cocci (prelim Staph epidermidis)-likely contamination.  Procedures: None  Consults: General surgery  Subjective: Abdominal pain has improved-no nausea or vomiting.  Objective: Vitals: Blood pressure (!) 170/67, pulse 73, temperature 99 F (37.2 C), temperature source Oral, resp. rate 18, height 4' 11 (1.499 m), weight 52.3 kg, SpO2 97%.   Exam: Gen Exam:Alert awake-not in any distress HEENT:atraumatic, normocephalic Chest: B/L clear to auscultation anteriorly CVS:S1S2 regular Abdomen: Soft-diffuse abdominal pain mostly in the mid lower abdominal area.  No peritoneal signs. Extremities:no edema Neurology: Non focal Skin: no rash  Pertinent Labs/Radiology:    Latest Ref Rng & Units 10/14/2024    2:37 AM 10/13/2024    5:18 AM 10/13/2024    5:17 AM  CBC  WBC 4.0 - 10.5 K/uL 14.1   17.4   Hemoglobin 12.0 - 15.0 g/dL 89.9  88.3  89.1   Hematocrit 36.0 - 46.0 % 31.8  34.0  34.6   Platelets 150 - 400 K/uL 131   209     Lab Results   Component Value Date   NA 140 10/14/2024   K 4.0 10/14/2024   CL 111 10/14/2024   CO2 17 (L) 10/14/2024      Assessment/Plan: Acute pancreatitis No gallstones seen on imaging studies-no history of EtOH use-?  Related to malignancy (liver lesion seen on CT abdomen) Continue IVF-clear liquids MRCP ordered-await results Probably will need GI evaluation at some point-but will wait for MRCP results.  Acute metabolic encephalopathy Unclear etiology-?  Colitis/fentanyl that she received in the emergency room Daughter acknowledges some cognitive issues at baseline-although mild Currently awake/alert-answering all my questions appropriately For now-supportive care Delirium precautions Follow clinical trend and see how she does  Transaminitis Unclear etiology but CT abdomen shows liver lesions Could have passed gallstone but no biliary dilatation MRCP pending. Check hepatitis serology in the interim Follow LFTs  ? Colitis No diarrhea-some vague abdominal pain-unclear significance of colitis-probably related to inflammation around liver area. Continue empiric Zosyn  Hypomagnesemia Replete/recheck  Gram-positive bacteremia-coag negative staph (preliminary result)  likely a contaminant Await final results-doubt needs to be treated at this point.  Liver lesions See above.  Hypomagnesemia Replete/recheck.  ?  Mild dementia/cognitive issues Check B12/TSH Ammonia level is only minimally elevated-doubt of any clinical significance. See above   Code status:   Code Status: Full Code   DVT Prophylaxis: enoxaparin (LOVENOX) injection 30 mg Start: 10/14/24 1000   Family Communication: Daughter at bedside   Disposition Plan: Status is: Observation The patient will require care spanning > 2 midnights and should be moved to  inpatient because: Severity of illness   Planned Discharge Destination:Home   Diet: Diet Order             Diet clear liquid Room service  appropriate? Yes; Fluid consistency: Thin  Diet effective now                     Antimicrobial agents: Anti-infectives (From admission, onward)    Start     Dose/Rate Route Frequency Ordered Stop   10/14/24 1400  piperacillin-tazobactam (ZOSYN) IVPB 2.25 g        2.25 g 100 mL/hr over 30 Minutes Intravenous Every 8 hours 10/14/24 1024     10/13/24 1400  piperacillin-tazobactam (ZOSYN) IVPB 3.375 g  Status:  Discontinued        3.375 g 100 mL/hr over 30 Minutes Intravenous Every 8 hours 10/13/24 1036 10/13/24 1038   10/13/24 1400  piperacillin-tazobactam (ZOSYN) IVPB 3.375 g  Status:  Discontinued        3.375 g 12.5 mL/hr over 240 Minutes Intravenous Every 8 hours 10/13/24 1039 10/14/24 1024   10/13/24 1045  piperacillin-tazobactam (ZOSYN) IVPB 3.375 g  Status:  Discontinued        3.375 g 12.5 mL/hr over 240 Minutes Intravenous Every 8 hours 10/13/24 1038 10/13/24 1039   10/13/24 0530  piperacillin-tazobactam (ZOSYN) IVPB 3.375 g        3.375 g 100 mL/hr over 30 Minutes Intravenous  Once 10/13/24 0525 10/13/24 9362        MEDICATIONS: Scheduled Meds:  enoxaparin (LOVENOX) injection  30 mg Subcutaneous Daily   feeding supplement  1 Container Oral TID BM   Continuous Infusions:  sodium chloride 100 mL/hr at 10/13/24 1911   magnesium sulfate bolus IVPB 4 g (10/14/24 1036)   piperacillin-tazobactam (ZOSYN)  IV     PRN Meds:.albuterol, fentaNYL (SUBLIMAZE) injection, hydrALAZINE, hydrALAZINE, ondansetron **OR** ondansetron (ZOFRAN) IV   I have personally reviewed following labs and imaging studies  LABORATORY DATA: CBC: Recent Labs  Lab 10/13/24 0517 10/13/24 0518 10/14/24 0237  WBC 17.4*  --  14.1*  NEUTROABS 15.0*  --   --   HGB 10.8* 11.6* 10.0*  HCT 34.6* 34.0* 31.8*  MCV 82.8  --  81.5  PLT 209  --  131*    Basic Metabolic Panel: Recent Labs  Lab 10/13/24 0517 10/13/24 0518 10/14/24 0237  NA 140 141 140  K 3.9 3.9 4.0  CL 108 110 111  CO2 19*   --  17*  GLUCOSE 201* 203* 214*  BUN 18 18 21   CREATININE 1.44* 1.40* 1.79*  CALCIUM  9.4  --  8.8*  MG  --   --  1.6*    GFR: Estimated Creatinine Clearance: 16.7 mL/min (A) (by C-G formula based on SCr of 1.79 mg/dL (H)).  Liver Function Tests: Recent Labs  Lab 10/13/24 0517 10/14/24 0237  AST 293* 372*  ALT 102* 225*  ALKPHOS 88 62  BILITOT 1.7* 2.0*  PROT 6.6 5.7*  ALBUMIN 3.5 3.0*   Recent Labs  Lab 10/13/24 0517  LIPASE >2,800*   Recent Labs  Lab 10/13/24 1756  AMMONIA 37*    Coagulation Profile: Recent Labs  Lab 10/13/24 0517  INR 1.1    Cardiac Enzymes: No results for input(s): CKTOTAL, CKMB, CKMBINDEX, TROPONINI in the last 168 hours.  BNP (last 3 results) No results for input(s): PROBNP in the last 8760 hours.  Lipid Profile: No results for input(s): CHOL, HDL, LDLCALC, TRIG, CHOLHDL, LDLDIRECT in the  last 72 hours.  Thyroid  Function Tests: No results for input(s): TSH, T4TOTAL, FREET4, T3FREE, THYROIDAB in the last 72 hours.  Anemia Panel: No results for input(s): VITAMINB12, FOLATE, FERRITIN, TIBC, IRON, RETICCTPCT in the last 72 hours.  Urine analysis:    Component Value Date/Time   COLORURINE YELLOW 10/13/2024 0616   APPEARANCEUR CLOUDY (A) 10/13/2024 0616   LABSPEC 1.010 10/13/2024 0616   PHURINE 6.0 10/13/2024 0616   GLUCOSEU 50 (A) 10/13/2024 0616   GLUCOSEU NEGATIVE 11/19/2021 1009   HGBUR SMALL (A) 10/13/2024 0616   BILIRUBINUR NEGATIVE 10/13/2024 0616   KETONESUR NEGATIVE 10/13/2024 0616   PROTEINUR NEGATIVE 10/13/2024 0616   UROBILINOGEN 0.2 11/19/2021 1009   NITRITE NEGATIVE 10/13/2024 0616   LEUKOCYTESUR LARGE (A) 10/13/2024 0616    Sepsis Labs: Lactic Acid, Venous    Component Value Date/Time   LATICACIDVEN 1.8 10/13/2024 0640    MICROBIOLOGY: Recent Results (from the past 240 hours)  Blood Culture (routine x 2)     Status: None (Preliminary result)   Collection  Time: 10/13/24  5:17 AM   Specimen: BLOOD  Result Value Ref Range Status   Specimen Description BLOOD BLOOD LEFT ARM  Final   Special Requests   Final    BOTTLES DRAWN AEROBIC AND ANAEROBIC Blood Culture adequate volume   Culture   Final    NO GROWTH 1 DAY Performed at [facility], [address] [place], [place] [address]    Report Status PENDING  Incomplete  Blood Culture (routine x 2)     Status: None (Preliminary result)   Collection Time: 10/13/24  5:17 AM   Specimen: BLOOD  Result Value Ref Range Status   Specimen Description BLOOD BLOOD LEFT HAND  Final   Special Requests   Final    BOTTLES DRAWN AEROBIC ONLY Blood Culture results may not be optimal due to an inadequate volume of blood received in culture bottles   Culture  Setup Time   Final    GRAM POSITIVE COCCI IN CLUSTERS AEROBIC BOTTLE ONLY CRITICAL RESULT CALLED TO, READ BACK BY AND VERIFIED WITH: MAYA Ditch on [id number] @1010  by SM Performed at [facility], [address] [place], [place] [address]    Culture GRAM POSITIVE COCCI IN CLUSTERS  Final   Report Status PENDING  Incomplete  Blood Culture ID Panel (Reflexed)     Status: Abnormal   Collection Time: 10/13/24  5:17 AM  Result Value Ref Range Status   Enterococcus faecalis NOT DETECTED NOT DETECTED Final   Enterococcus Faecium NOT DETECTED NOT DETECTED Final   Listeria monocytogenes NOT DETECTED NOT DETECTED Final   Staphylococcus species DETECTED (A) NOT DETECTED Final    Comment: CRITICAL RESULT CALLED TO, READ BACK BY AND VERIFIED WITH: PHARMD Jeremy on [id number] @1010  by SM    Staphylococcus aureus (BCID) NOT DETECTED NOT DETECTED Final   Staphylococcus epidermidis DETECTED (A) NOT DETECTED Final    Comment: CRITICAL RESULT CALLED TO, READ BACK BY AND VERIFIED WITH: PHARMD Jeremy on [id number] @1010  by SM    Staphylococcus lugdunensis NOT DETECTED NOT DETECTED Final   Streptococcus species NOT DETECTED NOT DETECTED Final   Streptococcus  agalactiae NOT DETECTED NOT DETECTED Final   Streptococcus pneumoniae NOT DETECTED NOT DETECTED Final   Streptococcus pyogenes NOT DETECTED NOT DETECTED Final   A.calcoaceticus-baumannii NOT DETECTED NOT DETECTED Final   Bacteroides fragilis NOT DETECTED NOT DETECTED Final   Enterobacterales NOT DETECTED NOT DETECTED Final   Enterobacter cloacae complex  NOT DETECTED NOT DETECTED Final   Escherichia coli NOT DETECTED NOT DETECTED Final   Klebsiella aerogenes NOT DETECTED NOT DETECTED Final   Klebsiella oxytoca NOT DETECTED NOT DETECTED Final   Klebsiella pneumoniae NOT DETECTED NOT DETECTED Final   Proteus species NOT DETECTED NOT DETECTED Final   Salmonella species NOT DETECTED NOT DETECTED Final   Serratia marcescens NOT DETECTED NOT DETECTED Final   Haemophilus influenzae NOT DETECTED NOT DETECTED Final   Neisseria meningitidis NOT DETECTED NOT DETECTED Final   Pseudomonas aeruginosa NOT DETECTED NOT DETECTED Final   Stenotrophomonas maltophilia NOT DETECTED NOT DETECTED Final   Candida albicans NOT DETECTED NOT DETECTED Final   Candida auris NOT DETECTED NOT DETECTED Final   Candida glabrata NOT DETECTED NOT DETECTED Final   Candida krusei NOT DETECTED NOT DETECTED Final   Candida parapsilosis NOT DETECTED NOT DETECTED Final   Candida tropicalis NOT DETECTED NOT DETECTED Final   Cryptococcus neoformans/gattii NOT DETECTED NOT DETECTED Final   Methicillin resistance mecA/C NOT DETECTED NOT DETECTED Final    Comment: Performed at [facility], [address] [place], [place] [address]  Resp panel by RT-PCR (RSV, Flu A&B, Covid) Anterior Nasal Swab     Status: None   Collection Time: 10/13/24  7:14 AM   Specimen: Anterior Nasal Swab  Result Value Ref Range Status   SARS Coronavirus 2 by RT PCR NEGATIVE NEGATIVE Final   Influenza A by PCR NEGATIVE NEGATIVE Final   Influenza B by PCR NEGATIVE NEGATIVE Final    Comment: (NOTE) The Xpert Xpress SARS-CoV-2/FLU/RSV plus assay is  intended as an aid in the diagnosis of influenza from Nasopharyngeal swab specimens and should not be used as a sole basis for treatment. Nasal washings and aspirates are unacceptable for Xpert Xpress SARS-CoV-2/FLU/RSV testing.  Fact Sheet for Patients: bloggercourse.com  Fact Sheet for Healthcare Providers: seriousbroker.it  This test is not yet approved or cleared by the [place]  FDA and has been authorized for detection and/or diagnosis of SARS-CoV-2 by FDA under an Emergency Use Authorization (EUA). This EUA will remain in effect (meaning this test can be used) for the duration of the COVID-19 declaration under Section 564(b)(1) of the Act, 21 U.S.C. section 360bbb-3(b)(1), unless the authorization is terminated or revoked.     Resp Syncytial Virus by PCR NEGATIVE NEGATIVE Final    Comment: (NOTE) Fact Sheet for Patients: bloggercourse.com  Fact Sheet for Healthcare Providers: seriousbroker.it  This test is not yet approved or cleared by the [place]  FDA and has been authorized for detection and/or diagnosis of SARS-CoV-2 by FDA under an Emergency Use Authorization (EUA). This EUA will remain in effect (meaning this test can be used) for the duration of the COVID-19 declaration under Section 564(b)(1) of the Act, 21 U.S.C. section 360bbb-3(b)(1), unless the authorization is terminated or revoked.  Performed at [facility], [address] [place], [place] [address]   Urine Culture (for pregnant, neutropenic or urologic patients or patients with an indwelling urinary catheter)     Status: Abnormal   Collection Time: 10/13/24  9:25 AM   Specimen: Urine, Clean Catch  Result Value Ref Range Status   Specimen Description URINE, CLEAN CATCH  Final   Special Requests   Final    NONE Performed at [facility], [address], [place], [place] [address]     Culture MULTIPLE SPECIES PRESENT, SUGGEST RECOLLECTION (A)  Final   Report Status 10/14/2024 FINAL  Final  RADIOLOGY STUDIES/RESULTS: US  Abdomen Limited RUQ (LIVER/GB) Result Date: 10/13/2024 CLINICAL DATA:  Right upper quadrant abdominal pain. EXAM: ULTRASOUND ABDOMEN LIMITED RIGHT UPPER QUADRANT COMPARISON:  None Available. FINDINGS: Gallbladder: Gallbladder wall is upper normal to borderline thickened at 3.1 mm. No echogenic gallstones evident. Pericholecystic fluid evident. Sonographer reports no sonographic Murphy sign. Common bile duct: Diameter: 8 mm Liver: Liver lesion seen on CT earlier today not readily evident by ultrasound. Liver appears heterogeneous and echogenic. Portal vein is patent on color Doppler imaging with normal direction of blood flow towards the liver. Other: None. IMPRESSION: 1. Gallbladder wall is upper normal to borderline thickened at 3.1 mm. No echogenic gallstones evident and no sonographic Murphy sign. As on recent CT, pericholecystic fluid noted. Cholecystitis is not excluded. Correlation for signs/symptoms of cholecystitis recommended. Nuclear scintigraphy may prove helpful to further evaluate as clinically warranted. 2. Common bile duct is dilated at 8 mm. Correlation with liver function test recommended. 3. Liver appears heterogeneous and echogenic. This is a nonspecific finding but can be seen in the setting of hepatic steatosis. 4. Liver lesions seen on CT earlier today not readily evident by ultrasound. Electronically Signed   By: Camellia Candle M.D.   On: 10/13/2024 07:27   CT ABDOMEN PELVIS WO CONTRAST Result Date: 10/13/2024 CLINICAL DATA:  Abdominal pain with nausea. EXAM: CT ABDOMEN AND PELVIS WITHOUT CONTRAST TECHNIQUE: Multidetector CT imaging of the abdomen and pelvis was performed following the standard protocol without IV contrast. RADIATION DOSE REDUCTION: This exam was performed according to the departmental dose-optimization program which includes  automated exposure control, adjustment of the mA and/or kV according to patient size and/or use of iterative reconstruction technique. COMPARISON:  None Available. FINDINGS: Lower chest: Calcified granuloma noted left lower lobe. Hepatobiliary: Small volume perihepatic free fluid with findings suspicious for periportal edema although assessment is limited by lack of intravenous contrast material. 4.8 x 3.1 cm subtle lesion identified right liver on image 15 of series 3. Additional smaller lesions seen towards the dome of the liver measuring up to 2.1 cm on image 9 of series 3. Gallbladder is distended with features suggesting gallbladder wall thickening. No calcified gallstones evident. There is edema/fluid in the region the gallbladder fossa and hepatoduodenal ligament. Although assessment limited by lack of intravenous contrast material, no gross dilatation of the common bile duct. Pancreas: No definite pancreatic mass.  No main duct dilatation. Spleen: No splenomegaly. No suspicious focal mass lesion. Adrenals/Urinary Tract: No adrenal nodule or mass. Right kidney unremarkable. Low-density lesions in the left kidney approach water density and are likely cysts. No evidence for hydroureter. Bladder unremarkable. Stomach/Bowel: Stomach is unremarkable. No gastric wall thickening. No evidence of outlet obstruction. Duodenum passes through the area of edema/inflammatory change of the right upper quadrant. Duodenum does not cross the midline as expected suggesting a component of small bowel malrotation. No small bowel wall thickening. No small bowel dilatation. Despite the course of the duodenum, the cecum is positioned in the right lower quadrant. The terminal ileum is normal. The appendix is normal. There is some fluid adjacent to the distal ascending colon and hepatic flexure. Diverticular disease is noted in the left colon without diverticulitis. Vascular/Lymphatic: There is advanced atherosclerotic calcification  of the abdominal aorta without aneurysm. No abdominal lymphadenopathy. No pelvic lymphadenopathy. Reproductive: Uterine fibroids evident.  There is no adnexal mass. Other: No substantial free fluid in the pelvis. Musculoskeletal: No worrisome lytic or sclerotic osseous abnormality. IMPRESSION: 1. 4.8 x 3.1 cm subtle lesion identified right  liver with additional smaller lesions seen towards the dome of the liver measuring up to 2.1 cm. Imaging features are indeterminate. MRI of the abdomen with and without contrast recommended to further evaluate as metastatic disease or primary hepatic neoplasm not excluded. 2. Gallbladder is distended with features suggesting ill-defined gallbladder wall thickening on this noncontrast study. No calcified gallstones evident. There is edema/fluid in the region the gallbladder fossa and hepatoduodenal ligament. Imaging features are nonspecific but could be seen in the setting of acute cholecystitis. Right upper quadrant ultrasound may prove helpful to further evaluate. 3. Edema/inflammation is identified along the distal ascending colon and hepatic flexure, potentially secondary to changes described above in the gallbladder fossa. No associated colonic wall thickening to suggest colon represents etiology of these findings, but colitis is not excluded. 4. Small volume perihepatic free fluid with question of periportal edema. 5. Left colonic diverticulosis without diverticulitis. 6. Uterine fibroids. 7.  Aortic Atherosclerosis (ICD10-I70.0). Electronically Signed   By: Camellia Candle M.D.   On: 10/13/2024 06:41   DG Chest Port 1 View Result Date: 10/13/2024 CLINICAL DATA:  Possible sepsis. EXAM: PORTABLE CHEST 1 VIEW COMPARISON:  None Available. FINDINGS: The lungs are clear without focal pneumonia, edema, pneumothorax or pleural effusion. Biapical pleuroparenchymal scarring evident. Cardiopericardial silhouette is at upper limits of normal for size. No acute bony abnormality.  Telemetry leads overlie the chest. IMPRESSION: No active disease. Electronically Signed   By: Camellia Candle M.D.   On: 10/13/2024 05:46     LOS: 0 days   Donalda Applebaum, MD  [facility]    To contact the attending provider between 7A-7P or the covering provider during after hours 7P-7A, please log into the web site [email or web address] and access using universal Anthony password for that web site. If you do not have the password, please call the hospital operator.  10/14/2024, 10:55 AM

## 2024-10-15 DIAGNOSIS — R109 Unspecified abdominal pain: Secondary | ICD-10-CM | POA: Diagnosis not present

## 2024-10-15 DIAGNOSIS — K769 Liver disease, unspecified: Secondary | ICD-10-CM | POA: Diagnosis not present

## 2024-10-15 DIAGNOSIS — R7401 Elevation of levels of liver transaminase levels: Secondary | ICD-10-CM | POA: Diagnosis not present

## 2024-10-15 DIAGNOSIS — K859 Acute pancreatitis without necrosis or infection, unspecified: Secondary | ICD-10-CM | POA: Diagnosis not present

## 2024-10-15 LAB — MAGNESIUM: Magnesium: 2.7 mg/dL — ABNORMAL HIGH (ref 1.7–2.4)

## 2024-10-15 LAB — HEPATIC FUNCTION PANEL
ALT: 134 U/L — ABNORMAL HIGH (ref 0–44)
AST: 135 U/L — ABNORMAL HIGH (ref 15–41)
Albumin: 2.4 g/dL — ABNORMAL LOW (ref 3.5–5.0)
Alkaline Phosphatase: 50 U/L (ref 38–126)
Bilirubin, Direct: 0.5 mg/dL — ABNORMAL HIGH (ref 0.0–0.2)
Indirect Bilirubin: 1.9 mg/dL — ABNORMAL HIGH (ref 0.3–0.9)
Total Bilirubin: 2.4 mg/dL — ABNORMAL HIGH (ref 0.0–1.2)
Total Protein: 5 g/dL — ABNORMAL LOW (ref 6.5–8.1)

## 2024-10-15 LAB — CBC
HCT: 28.7 % — ABNORMAL LOW (ref 36.0–46.0)
Hemoglobin: 9.1 g/dL — ABNORMAL LOW (ref 12.0–15.0)
MCH: 25.6 pg — ABNORMAL LOW (ref 26.0–34.0)
MCHC: 31.7 g/dL (ref 30.0–36.0)
MCV: 80.8 fL (ref 80.0–100.0)
Platelets: 121 K/uL — ABNORMAL LOW (ref 150–400)
RBC: 3.55 MIL/uL — ABNORMAL LOW (ref 3.87–5.11)
RDW: 16 % — ABNORMAL HIGH (ref 11.5–15.5)
WBC: 11.4 K/uL — ABNORMAL HIGH (ref 4.0–10.5)
nRBC: 0 % (ref 0.0–0.2)

## 2024-10-15 LAB — BASIC METABOLIC PANEL WITH GFR
Anion gap: 9 (ref 5–15)
BUN: 19 mg/dL (ref 8–23)
CO2: 18 mmol/L — ABNORMAL LOW (ref 22–32)
Calcium: 8.2 mg/dL — ABNORMAL LOW (ref 8.9–10.3)
Chloride: 110 mmol/L (ref 98–111)
Creatinine, Ser: 1.57 mg/dL — ABNORMAL HIGH (ref 0.44–1.00)
GFR, Estimated: 32 mL/min — ABNORMAL LOW (ref >60)
Glucose, Bld: 117 mg/dL — ABNORMAL HIGH (ref 70–99)
Potassium: 3.2 mmol/L — ABNORMAL LOW (ref 3.5–5.1)
Sodium: 137 mmol/L (ref 135–145)

## 2024-10-15 LAB — CULTURE, BLOOD (ROUTINE X 2)

## 2024-10-15 LAB — RPR: RPR Ser Ql: NONREACTIVE

## 2024-10-15 MED ORDER — PANTOPRAZOLE SODIUM 40 MG IV SOLR
40.0000 mg | Freq: Two times a day (BID) | INTRAVENOUS | Status: DC
  Administered 2024-10-15 – 2024-10-23 (×17): 40 mg via INTRAVENOUS
  Filled 2024-10-15 (×17): qty 10

## 2024-10-15 MED ORDER — AMLODIPINE BESYLATE 5 MG PO TABS
5.0000 mg | ORAL_TABLET | Freq: Every day | ORAL | Status: DC
  Administered 2024-10-15 – 2024-10-17 (×3): 5 mg via ORAL
  Filled 2024-10-15 (×3): qty 1

## 2024-10-15 MED ORDER — POTASSIUM CHLORIDE CRYS ER 20 MEQ PO TBCR
40.0000 meq | EXTENDED_RELEASE_TABLET | Freq: Once | ORAL | Status: AC
  Administered 2024-10-15: 40 meq via ORAL
  Filled 2024-10-15: qty 2

## 2024-10-15 MED ORDER — PIPERACILLIN-TAZOBACTAM 3.375 G IVPB
3.3750 g | Freq: Three times a day (TID) | INTRAVENOUS | Status: DC
  Administered 2024-10-15 – 2024-10-27 (×34): 3.375 g via INTRAVENOUS
  Filled 2024-10-15 (×34): qty 50

## 2024-10-15 MED ORDER — OXYCODONE HCL 5 MG PO TABS
5.0000 mg | ORAL_TABLET | Freq: Four times a day (QID) | ORAL | Status: DC | PRN
  Administered 2024-10-15: 5 mg via ORAL
  Filled 2024-10-15: qty 1

## 2024-10-15 NOTE — Progress Notes (Signed)
 Assessment & Plan: Epigastric abdominal pain Acute pancreatitis  Liver lesions on MRI - abscesses vs neoplasm - MRI scan reviewed; multiple small lesions in liver - tumor vs abscess - IR consult noted - plan biopsy/aspiration on Monday 11/17 - edema/inflammation upper abdomen - no significant pancreatitis noted - elevated transaminases, Tbili improved   FEN: CLD, IVF per [facility] VTE: LMWH ID: Zosyn 11/13>> does not have cholecystitis on exam    - per [facility] -  HTN HLD CKD stage IIIb  Patient in bed, comfortable.  Mild upper abdominal tenderness.  Nurse at bedside, daughter not present.  IR plans biopsy on Monday 11/17.  Surgery will continue to follow peripherally.        Haley Spinner, MD [facility] practice Office: [phone]        Chief Complaint: Abdominal pain  Subjective: Patient in bed, comfortable, pleasant.  Objective: Vital signs in last 24 hours: Temp:  [98.1 F (36.7 C)-99.2 F (37.3 C)] 98.7 F (37.1 C) (11/15 0803) Pulse Rate:  [67-80] 76 (11/15 0803) Resp:  [19-22] 22 (11/15 0803) BP: (157-191)/(45-86) 158/73 (11/15 1048) SpO2:  [94 %-99 %] 96 % (11/15 0803) Last BM Date : 10/13/24  Intake/Output from previous day: 11/14 0701 - 11/15 0700 In: -  Out: 200 [Urine:200] Intake/Output this shift: No intake/output data recorded.  Physical Exam: HEENT - sclerae clear, mucous membranes moist Abdomen - soft, mild tenderness epigastrium and RUQ; no guarding; no mass  Lab Results:  Recent Labs    10/14/24 0237 10/15/24 0344  WBC 14.1* 11.4*  HGB 10.0* 9.1*  HCT 31.8* 28.7*  PLT 131* 121*   BMET Recent Labs    10/14/24 0237 10/15/24 0344  NA 140 137  K 4.0 3.2*  CL 111 110  CO2 17* 18*  GLUCOSE 214* 117*  BUN 21 19  CREATININE 1.79* 1.57*  CALCIUM  8.8* 8.2*   PT/INR Recent Labs    10/13/24 0517  LABPROT 14.3  INR 1.1   Comprehensive Metabolic Panel:    Component Value Date/Time   NA 137 10/15/2024  0344   NA 140 10/14/2024 0237   NA 143 09/07/2023 1005   NA 146 (H) 09/01/2023 0927   K 3.2 (L) 10/15/2024 0344   K 4.0 10/14/2024 0237   CL 110 10/15/2024 0344   CL 111 10/14/2024 0237   CO2 18 (L) 10/15/2024 0344   CO2 17 (L) 10/14/2024 0237   BUN 19 10/15/2024 0344   BUN 21 10/14/2024 0237   BUN 15 09/07/2023 1005   BUN 17 09/01/2023 0927   CREATININE 1.57 (H) 10/15/2024 0344   CREATININE 1.79 (H) 10/14/2024 0237   GLUCOSE 117 (H) 10/15/2024 0344   GLUCOSE 214 (H) 10/14/2024 0237   CALCIUM  8.2 (L) 10/15/2024 0344   CALCIUM  8.8 (L) 10/14/2024 0237   AST 135 (H) 10/15/2024 0344   AST 372 (H) 10/14/2024 0237   ALT 134 (H) 10/15/2024 0344   ALT 225 (H) 10/14/2024 0237   ALKPHOS 50 10/15/2024 0344   ALKPHOS 62 10/14/2024 0237   BILITOT 2.4 (H) 10/15/2024 0344   BILITOT 2.0 (H) 10/14/2024 0237   BILITOT 0.8 09/01/2023 0927   BILITOT 0.7 11/17/2022 0734   PROT 5.0 (L) 10/15/2024 0344   PROT 5.7 (L) 10/14/2024 0237   PROT 7.0 09/01/2023 0927   PROT 6.5 11/17/2022 0734   ALBUMIN 2.4 (L) 10/15/2024 0344   ALBUMIN 3.0 (L) 10/14/2024 0237   ALBUMIN 4.4 09/01/2023 0927   ALBUMIN  4.2 11/17/2022 0734    Studies/Results: MR ABDOMEN MRCP W WO CONTAST Result Date: 10/15/2024 CLINICAL DATA:  Pancreatitis.  Liver lesions. EXAM: MRI ABDOMEN WITHOUT AND WITH CONTRAST (INCLUDING MRCP) TECHNIQUE: Multiplanar multisequence MR imaging of the abdomen was performed both before and after the administration of intravenous contrast. Heavily T2-weighted images of the biliary and pancreatic ducts were obtained, and three-dimensional MRCP images were rendered by post processing. CONTRAST:  5mL GADAVIST GADOBUTROL 1 MMOL/ML IV SOLN COMPARISON:  CT scan and abdominal ultrasound from 1 day prior. FINDINGS: Lower chest: Right pleural effusion. Hepatobiliary: Peripheral multicystic enhancing lesions are identified in the liver. 3.6 x 3.0 cm lesion in the hepatic dome seen on image 15/1001. Lateral right  hepatic lobe lesion seen on 27/1 1,001 measures 6.3 x 3.8 cm. Mild periportal edema. No substantial intrahepatic biliary duct dilatation. Common duct measures 7 mm in the hepatoduodenal ligament with common bile duct measuring on the order of 4 mm in the pancreatic head. MRCP imaging is markedly motion degraded with no definite choledocholithiasis. Pancreas: No main duct dilatation. No focal pancreatic mass lesion. Pancreas does not appear frankly edematous by MRI. Spleen:  No splenomegaly. No suspicious focal mass lesion. Adrenals/Urinary Tract: No adrenal nodule or mass. Right kidney unremarkable. Multiple simple cysts are identified in the left kidney, lower pole predominant. Stomach/Bowel: Stomach is unremarkable. No gastric wall thickening. No evidence of outlet obstruction. Duodenum appears edematous with wall thickening. As noted on previous CT, the duodenum does not cross the midline as expected, tracking down towards the pelvis anterior to the IVC. No obvious twisting or swirling of the duodenum and stomach is nondilated. The edema/inflammation in the right upper quadrant along the duodenum, gallbladder fossa, and hepatic flexure is progressive in the interval since the prior CT. Vascular/Lymphatic: Atherosclerotic disease characterizes the abdominal aorta. IVC is collapsed but appears patent. This may be related to hypovolemia or mass-effect from the edema/inflammation in the right upper quadrant and right retroperitoneal tissues. Main portal vein and bifurcation into the left and right portal vein patent. Splenic vein is diminutive but appears to be patent. There is mass-effect on the portal splenic confluence but the SMV does opacified. Other: Extensive edema is identified in the retroperitoneal anatomy of the right abdomen, extending down towards the pelvis and involving the musculature of the lateral right abdominal wall. Musculoskeletal: No focal suspicious marrow enhancement within the visualized  bony anatomy. IMPRESSION: 1. Peripheral multicystic enhancing lesions in the liver. Imaging features raise concern for intrahepatic abscesses. Metastatic disease or primary hepatic neoplasm not excluded. 2. Edema/inflammation in the right upper quadrant along the duodenum, gallbladder fossa, and hepatic flexure is progressive in the interval since the prior CT. Increasing right pleural effusion since prior CT. 3. Duodenum does not cross the midline as expected and appears edematous with wall thickening. No obvious twisting or swirling of the duodenum and stomach is nondilated. Imaging features compatible with component of malrotation although the SMA/SMV orientation appears appropriate 4. Gallbladder is distended with pericholecystic fluid. No definite cholelithiasis. 5. IVC is collapsed but appears patent. This may be related to hypovolemia or mass-effect from the edema/inflammation in the right upper quadrant/right retroperitoneal tissues. 6. No substantial intrahepatic biliary duct dilatation. Common duct measures 7 mm in the hepatoduodenal ligament with common bile duct measuring on the order of 4 mm in the pancreatic head. MRCP imaging is markedly motion degraded with no definite choledocholithiasis. 7. No main pancreatic duct dilatation. No focal pancreatic mass lesion. Pancreas does not appear overtly  edematous by MRI. These results will be called to the ordering clinician or representative by the Radiologist Assistant, and communication documented in the PACS or [employer]. Electronically Signed   By: Camellia Candle M.D.   On: 10/15/2024 05:50      Haley Randall 10/15/2024  Patient ID: Haley Randall, female   DOB: 09/29/1936, 88 y.o.   MRN: [id number]

## 2024-10-15 NOTE — Progress Notes (Signed)
  IR BRIEF PROGRESS NOTE:  IR has received request for liver lesion biopsy. It was reviewed by [facility]IR attending, Dr. Luverne, and approved for CT guided aspiration/bx as lesions are poorly visualized on US  imaging. Dr. Luverne adds that the liver lesions are concerning for possible microabscesses vs. necrotic tumors, but remain too small for drain placement. IR will see patient over the weekend and line up for tentative biopsy on Monday 11/17, schedule permitting. NPO order  for 11/17 placed in anticipation of moderate sedation. Daily SubQ Lovenox will need to be held for one dose prior to procedure, and 10 am dose on 11/16 and 11/17 are therefore held as well.    Electronically Signed: Carlin DELENA Griffon, PA-C 10/15/2024, 10:00 AM

## 2024-10-15 NOTE — Plan of Care (Signed)

## 2024-10-15 NOTE — Progress Notes (Signed)
 PHARMACY NOTE:  ANTIMICROBIAL RENAL DOSAGE ADJUSTMENT  Current antimicrobial regimen includes a mismatch between antimicrobial dosage and estimated renal function.  As per policy approved by the Pharmacy & Therapeutics and Medical Executive Committees, the antimicrobial dosage will be adjusted accordingly.  Current antimicrobial dosage:  Zosyn 2.25g IV q8h  Indication: Intra-abdominal infection  Renal Function:  Estimated Creatinine Clearance: 19 mL/min (A) (by C-G formula based on SCr of 1.57 mg/dL (H)). []      On intermittent HD, scheduled: []      On CRRT    Antimicrobial dosage has been changed to:  Zosyn 3.375 IV q8h  Katie Tevis Dunavan, PharmD PGY1 Pharmacy Resident

## 2024-10-15 NOTE — Progress Notes (Addendum)
 PROGRESS NOTE        PATIENT DETAILS Name: Haley Randall Age: 88 y.o. Sex: female Date of Birth: [birth year] Admit Date: 10/13/2024 Admitting Physician [name], MD ERE:Ilhjo, Ginger, FNP  Brief Summary: Patient is a 88 y.o.  female with history of HTN, HLD-who presented with abdominal pain-found to have pancreatitis/vague liver lesions on CT abdomen.  Significant events: 11/13>> admit to [facility]  Significant studies: 11/13>> CXR: No PNA 11/13>> CT abdomen/pelvis: 4.8 x 3.1 cm subtle lesion-right liver-additional small lesions measuring up to 2.1 cm.  Gallbladder distended.  Edema/inflammation along the descending ascending colon/hepatic flexure. 11/13>> RUQ ultrasound: Gallbladder wall upper normal to borderline thickened, no echogenic gallstones.  CBD 8 mm. 11/14>> MRI abdomen: Multicystic enhancing lesions in the liver-abscesses versus metastatic disease  Significant microbiology data: 11/13>> COVID/influenza/RSV PCR: Negative 11/13>> urine culture: Multiple species 11/13>> 1/2: Gram-positive cocci (prelim Staph epidermidis)-likely contamination. 11/14>> acute hepatitis serology: Negative 11/14>> RPR: Nonreactive  Procedures: None  Consults: General surgery IR  Subjective: Some abdominal pain but overall better-tolerating clear liquids.  Objective: Vitals: Blood pressure (!) 158/73, pulse 76, temperature 98.7 F (37.1 C), temperature source Oral, resp. rate (!) 22, height 4' 11 (1.499 m), weight 52.3 kg, SpO2 96%.   Exam: Gen Exam:Alert awake-not in any distress HEENT:atraumatic, normocephalic Chest: B/L clear to auscultation anteriorly CVS:S1S2 regular Abdomen:soft-mildly tender in central abdominal area without any peritoneal signs-better than the past several days. Extremities:no edema Neurology: Non focal Skin: no rash  Pertinent Labs/Radiology:    Latest Ref Rng & Units 10/15/2024    3:44 AM 10/14/2024    2:37 AM  10/13/2024    5:18 AM  CBC  WBC 4.0 - 10.5 K/uL 11.4  14.1    Hemoglobin 12.0 - 15.0 g/dL 9.1  89.9  88.3   Hematocrit 36.0 - 46.0 % 28.7  31.8  34.0   Platelets 150 - 400 K/uL 121  131      Lab Results  Component Value Date   NA 137 10/15/2024   K 3.2 (L) 10/15/2024   CL 110 10/15/2024   CO2 18 (L) 10/15/2024      Assessment/Plan: Acute pancreatitis No gallstones seen on imaging studies-no history of EtOH use-?  Related to malignancy (liver lesion seen on imaging studies).  MRCP negative for choledocholithiasis-no major pancreatic abnormality on MRI. Clinically slowly improving Continue IVF Advance to full liquid diet today  Acute metabolic encephalopathy Unclear etiology-?  Colitis/fentanyl that she received in the emergency room-superimposed on some cognitive issues at baseline (daughter acknowledges mild cognitive issues at baseline) Currently much more awake alert-no signs of confusion this morning Maintain delirium precautions  Transaminitis Potentially due to mass effect from liver lesions-alternatively could could have passed a small stone Acute hepatitis serology negative Transaminases downtrending MRCP as above Trend LFTs Continue supportive care  Multicystic enhancing liver lesions on MRCP abdomen  Unclear whether this is abscess (does have colitis on CT imaging) versus metastatic disease Continue Zosyn Discussed with IR-PA-C-Charles Lavetta 11/15-biopsy planned for 11/17-Will need pathology and also Gram stain/culture sensitivity.  ? Colitis No diarrhea Given liver masses-this could be a real issue that has caused liver abscesses. Continue Zosyn See above regarding plan liver biopsy 11/17.  HTN BP slowly creeping up Start amlodipine  and see how she does  Hypomagnesemia Repleted  Hypokalemia Replete/recheck  Gram-positive bacteremia-coag negative staph (preliminary result)  likely a contaminant Await final results-doubt needs to be treated at this  point.  ?  Mild dementia/cognitive issues B12/TSH/RPR all stable Ammonia level is only minimally elevated-doubt of any clinical significance. No confusion this morning-continue to maintain delirium precautions  Palliative care Long discussion with patient/daughter at bedside-regarding MRI abdomen findings-abscess versus metastatic disease.  They seem okay with proceeding with a liver biopsy to differentiate between the two, however if it turns out that this is in fact malignancy-they may not want any further workup/treatment.  I have asked them to continue discussion amongst themselves, our service will follow tomorrow-and continue to clarify on goals of care.  Code status:   Code Status: Full Code   DVT Prophylaxis: enoxaparin (LOVENOX) injection 30 mg Start: 10/14/24 1000   Family Communication: Daughter at bedside   Disposition Plan: Status is: Observation The patient will require care spanning > 2 midnights and should be moved to inpatient because: Severity of illness   Planned Discharge Destination:Home   Diet: Diet Order             Diet full liquid Fluid consistency: Thin  Diet effective now                     Antimicrobial agents: Anti-infectives (From admission, onward)    Start     Dose/Rate Route Frequency Ordered Stop   10/14/24 1400  piperacillin-tazobactam (ZOSYN) IVPB 2.25 g        2.25 g 100 mL/hr over 30 Minutes Intravenous Every 8 hours 10/14/24 1024     10/13/24 1400  piperacillin-tazobactam (ZOSYN) IVPB 3.375 g  Status:  Discontinued        3.375 g 100 mL/hr over 30 Minutes Intravenous Every 8 hours 10/13/24 1036 10/13/24 1038   10/13/24 1400  piperacillin-tazobactam (ZOSYN) IVPB 3.375 g  Status:  Discontinued        3.375 g 12.5 mL/hr over 240 Minutes Intravenous Every 8 hours 10/13/24 1039 10/14/24 1024   10/13/24 1045  piperacillin-tazobactam (ZOSYN) IVPB 3.375 g  Status:  Discontinued        3.375 g 12.5 mL/hr over 240 Minutes  Intravenous Every 8 hours 10/13/24 1038 10/13/24 1039   10/13/24 0530  piperacillin-tazobactam (ZOSYN) IVPB 3.375 g        3.375 g 100 mL/hr over 30 Minutes Intravenous  Once 10/13/24 0525 10/13/24 9362        MEDICATIONS: Scheduled Meds:  amLODipine   5 mg Oral Daily   enoxaparin (LOVENOX) injection  30 mg Subcutaneous Daily   feeding supplement  1 Container Oral TID BM   pantoprazole (PROTONIX) IV  40 mg Intravenous Q12H   Continuous Infusions:  sodium chloride 100 mL/hr at 10/15/24 0420   piperacillin-tazobactam (ZOSYN)  IV 2.25 g (10/15/24 0506)   PRN Meds:.albuterol, fentaNYL (SUBLIMAZE) injection, hydrALAZINE, hydrALAZINE, ondansetron **OR** ondansetron (ZOFRAN) IV   I have personally reviewed following labs and imaging studies  LABORATORY DATA: CBC: Recent Labs  Lab 10/13/24 0517 10/13/24 0518 10/14/24 0237 10/15/24 0344  WBC 17.4*  --  14.1* 11.4*  NEUTROABS 15.0*  --   --   --   HGB 10.8* 11.6* 10.0* 9.1*  HCT 34.6* 34.0* 31.8* 28.7*  MCV 82.8  --  81.5 80.8  PLT 209  --  131* 121*    Basic Metabolic Panel: Recent Labs  Lab 10/13/24 0517 10/13/24 0518 10/14/24 0237 10/15/24 0344  NA 140 141 140 137  K 3.9 3.9 4.0 3.2*  CL 108 110 111  110  CO2 19*  --  17* 18*  GLUCOSE 201* 203* 214* 117*  BUN 18 18 21 19   CREATININE 1.44* 1.40* 1.79* 1.57*  CALCIUM  9.4  --  8.8* 8.2*  MG  --   --  1.6* 2.7*    GFR: Estimated Creatinine Clearance: 19 mL/min (A) (by C-G formula based on SCr of 1.57 mg/dL (H)).  Liver Function Tests: Recent Labs  Lab 10/13/24 0517 10/14/24 0237 10/15/24 0344  AST 293* 372* 135*  ALT 102* 225* 134*  ALKPHOS 88 62 50  BILITOT 1.7* 2.0* 2.4*  PROT 6.6 5.7* 5.0*  ALBUMIN 3.5 3.0* 2.4*   Recent Labs  Lab 10/13/24 0517  LIPASE >2,800*   Recent Labs  Lab 10/13/24 1756  AMMONIA 37*    Coagulation Profile: Recent Labs  Lab 10/13/24 0517  INR 1.1    Cardiac Enzymes: No results for input(s): CKTOTAL, CKMB,  CKMBINDEX, TROPONINI in the last 168 hours.  BNP (last 3 results) No results for input(s): PROBNP in the last 8760 hours.  Lipid Profile: No results for input(s): CHOL, HDL, LDLCALC, TRIG, CHOLHDL, LDLDIRECT in the last 72 hours.  Thyroid  Function Tests: Recent Labs    10/14/24 1614  TSH 1.241    Anemia Panel: Recent Labs    10/14/24 1614  VITAMINB12 481    Urine analysis:    Component Value Date/Time   COLORURINE YELLOW 10/13/2024 0616   APPEARANCEUR CLOUDY (A) 10/13/2024 0616   LABSPEC 1.010 10/13/2024 0616   PHURINE 6.0 10/13/2024 0616   GLUCOSEU 50 (A) 10/13/2024 0616   GLUCOSEU NEGATIVE 11/19/2021 1009   HGBUR SMALL (A) 10/13/2024 0616   BILIRUBINUR NEGATIVE 10/13/2024 0616   KETONESUR NEGATIVE 10/13/2024 0616   PROTEINUR NEGATIVE 10/13/2024 0616   UROBILINOGEN 0.2 11/19/2021 1009   NITRITE NEGATIVE 10/13/2024 0616   LEUKOCYTESUR LARGE (A) 10/13/2024 0616    Sepsis Labs: Lactic Acid, Venous    Component Value Date/Time   LATICACIDVEN 1.8 10/13/2024 0640    MICROBIOLOGY: Recent Results (from the past 240 hours)  Blood Culture (routine x 2)     Status: None (Preliminary result)   Collection Time: 10/13/24  5:17 AM   Specimen: BLOOD  Result Value Ref Range Status   Specimen Description BLOOD BLOOD LEFT ARM  Final   Special Requests   Final    BOTTLES DRAWN AEROBIC AND ANAEROBIC Blood Culture adequate volume   Culture   Final    NO GROWTH 2 DAYS Performed at [facility], [address], [place], [place] [address]    Report Status PENDING  Incomplete  Blood Culture (routine x 2)     Status: None (Preliminary result)   Collection Time: 10/13/24  5:17 AM   Specimen: BLOOD  Result Value Ref Range Status   Specimen Description BLOOD BLOOD LEFT HAND  Final   Special Requests   Final    BOTTLES DRAWN AEROBIC ONLY Blood Culture results may not be optimal due to an inadequate volume of blood received in culture bottles   Culture   Setup Time   Final    GRAM POSITIVE COCCI IN CLUSTERS AEROBIC BOTTLE ONLY CRITICAL RESULT CALLED TO, READ BACK BY AND VERIFIED WITH: MAYA Ditch on [id number] @1010  by SM Performed at [facility], [address] [place], [place] [address]    Culture GRAM POSITIVE COCCI IN CLUSTERS  Final   Report Status PENDING  Incomplete  Blood Culture ID Panel (Reflexed)     Status: Abnormal  Collection Time: 10/13/24  5:17 AM  Result Value Ref Range Status   Enterococcus faecalis NOT DETECTED NOT DETECTED Final   Enterococcus Faecium NOT DETECTED NOT DETECTED Final   Listeria monocytogenes NOT DETECTED NOT DETECTED Final   Staphylococcus species DETECTED (A) NOT DETECTED Final    Comment: CRITICAL RESULT CALLED TO, READ BACK BY AND VERIFIED WITH: PHARMD Jeremy on [phone] @1010  by SM    Staphylococcus aureus (BCID) NOT DETECTED NOT DETECTED Final   Staphylococcus epidermidis DETECTED (A) NOT DETECTED Final    Comment: CRITICAL RESULT CALLED TO, READ BACK BY AND VERIFIED WITH: PHARMD Jeremy on [phone] @1010  by SM    Staphylococcus lugdunensis NOT DETECTED NOT DETECTED Final   Streptococcus species NOT DETECTED NOT DETECTED Final   Streptococcus agalactiae NOT DETECTED NOT DETECTED Final   Streptococcus pneumoniae NOT DETECTED NOT DETECTED Final   Streptococcus pyogenes NOT DETECTED NOT DETECTED Final   A.calcoaceticus-baumannii NOT DETECTED NOT DETECTED Final   Bacteroides fragilis NOT DETECTED NOT DETECTED Final   Enterobacterales NOT DETECTED NOT DETECTED Final   Enterobacter cloacae complex NOT DETECTED NOT DETECTED Final   Escherichia coli NOT DETECTED NOT DETECTED Final   Klebsiella aerogenes NOT DETECTED NOT DETECTED Final   Klebsiella oxytoca NOT DETECTED NOT DETECTED Final   Klebsiella pneumoniae NOT DETECTED NOT DETECTED Final   Proteus species NOT DETECTED NOT DETECTED Final   Salmonella species NOT DETECTED NOT DETECTED Final   Serratia marcescens NOT DETECTED NOT DETECTED  Final   Haemophilus influenzae NOT DETECTED NOT DETECTED Final   Neisseria meningitidis NOT DETECTED NOT DETECTED Final   Pseudomonas aeruginosa NOT DETECTED NOT DETECTED Final   Stenotrophomonas maltophilia NOT DETECTED NOT DETECTED Final   Candida albicans NOT DETECTED NOT DETECTED Final   Candida auris NOT DETECTED NOT DETECTED Final   Candida glabrata NOT DETECTED NOT DETECTED Final   Candida krusei NOT DETECTED NOT DETECTED Final   Candida parapsilosis NOT DETECTED NOT DETECTED Final   Candida tropicalis NOT DETECTED NOT DETECTED Final   Cryptococcus neoformans/gattii NOT DETECTED NOT DETECTED Final   Methicillin resistance mecA/C NOT DETECTED NOT DETECTED Final    Comment: Performed at [facility], [address] [place], [place] [address]  Resp panel by RT-PCR (RSV, Flu A&B, Covid) Anterior Nasal Swab     Status: None   Collection Time: 10/13/24  7:14 AM   Specimen: Anterior Nasal Swab  Result Value Ref Range Status   SARS Coronavirus 2 by RT PCR NEGATIVE NEGATIVE Final   Influenza A by PCR NEGATIVE NEGATIVE Final   Influenza B by PCR NEGATIVE NEGATIVE Final    Comment: (NOTE) The Xpert Xpress SARS-CoV-2/FLU/RSV plus assay is intended as an aid in the diagnosis of influenza from Nasopharyngeal swab specimens and should not be used as a sole basis for treatment. Nasal washings and aspirates are unacceptable for Xpert Xpress SARS-CoV-2/FLU/RSV testing.  Fact Sheet for Patients: bloggercourse.com  Fact Sheet for Healthcare Providers: seriousbroker.it  This test is not yet approved or cleared by the [place]  FDA and has been authorized for detection and/or diagnosis of SARS-CoV-2 by FDA under an Emergency Use Authorization (EUA). This EUA will remain in effect (meaning this test can be used) for the duration of the COVID-19 declaration under Section 564(b)(1) of the Act, 21 U.S.C. section 360bbb-3(b)(1), unless  the authorization is terminated or revoked.     Resp Syncytial Virus by PCR NEGATIVE NEGATIVE Final    Comment: (NOTE) Fact Sheet for Patients: bloggercourse.com  Fact Sheet for Healthcare Providers: seriousbroker.it  This test is not yet approved or cleared by the [place]  FDA and has been authorized for detection and/or diagnosis of SARS-CoV-2 by FDA under an Emergency Use Authorization (EUA). This EUA will remain in effect (meaning this test can be used) for the duration of the COVID-19 declaration under Section 564(b)(1) of the Act, 21 U.S.C. section 360bbb-3(b)(1), unless the authorization is terminated or revoked.  Performed at [facility], [address], [place], [place] [address]   Urine Culture (for pregnant, neutropenic or urologic patients or patients with an indwelling urinary catheter)     Status: Abnormal   Collection Time: 10/13/24  9:25 AM   Specimen: Urine, Clean Catch  Result Value Ref Range Status   Specimen Description URINE, CLEAN CATCH  Final   Special Requests   Final    NONE Performed at [facility], [address], [place], [place] [address]    Culture MULTIPLE SPECIES PRESENT, SUGGEST RECOLLECTION (A)  Final   Report Status 10/14/2024 FINAL  Final    RADIOLOGY STUDIES/RESULTS: MR ABDOMEN MRCP W WO CONTAST Result Date: 10/15/2024 CLINICAL DATA:  Pancreatitis.  Liver lesions. EXAM: MRI ABDOMEN WITHOUT AND WITH CONTRAST (INCLUDING MRCP) TECHNIQUE: Multiplanar multisequence MR imaging of the abdomen was performed both before and after the administration of intravenous contrast. Heavily T2-weighted images of the biliary and pancreatic ducts were obtained, and three-dimensional MRCP images were rendered by post processing. CONTRAST:  5mL GADAVIST GADOBUTROL 1 MMOL/ML IV SOLN COMPARISON:  CT scan and abdominal ultrasound from 1 day prior. FINDINGS: Lower chest: Right pleural effusion.  Hepatobiliary: Peripheral multicystic enhancing lesions are identified in the liver. 3.6 x 3.0 cm lesion in the hepatic dome seen on image 15/1001. Lateral right hepatic lobe lesion seen on 27/1 1,001 measures 6.3 x 3.8 cm. Mild periportal edema. No substantial intrahepatic biliary duct dilatation. Common duct measures 7 mm in the hepatoduodenal ligament with common bile duct measuring on the order of 4 mm in the pancreatic head. MRCP imaging is markedly motion degraded with no definite choledocholithiasis. Pancreas: No main duct dilatation. No focal pancreatic mass lesion. Pancreas does not appear frankly edematous by MRI. Spleen:  No splenomegaly. No suspicious focal mass lesion. Adrenals/Urinary Tract: No adrenal nodule or mass. Right kidney unremarkable. Multiple simple cysts are identified in the left kidney, lower pole predominant. Stomach/Bowel: Stomach is unremarkable. No gastric wall thickening. No evidence of outlet obstruction. Duodenum appears edematous with wall thickening. As noted on previous CT, the duodenum does not cross the midline as expected, tracking down towards the pelvis anterior to the IVC. No obvious twisting or swirling of the duodenum and stomach is nondilated. The edema/inflammation in the right upper quadrant along the duodenum, gallbladder fossa, and hepatic flexure is progressive in the interval since the prior CT. Vascular/Lymphatic: Atherosclerotic disease characterizes the abdominal aorta. IVC is collapsed but appears patent. This may be related to hypovolemia or mass-effect from the edema/inflammation in the right upper quadrant and right retroperitoneal tissues. Main portal vein and bifurcation into the left and right portal vein patent. Splenic vein is diminutive but appears to be patent. There is mass-effect on the portal splenic confluence but the SMV does opacified. Other: Extensive edema is identified in the retroperitoneal anatomy of the right abdomen, extending down  towards the pelvis and involving the musculature of the lateral right abdominal wall. Musculoskeletal: No focal suspicious marrow enhancement within the visualized bony anatomy. IMPRESSION: 1. Peripheral multicystic enhancing  lesions in the liver. Imaging features raise concern for intrahepatic abscesses. Metastatic disease or primary hepatic neoplasm not excluded. 2. Edema/inflammation in the right upper quadrant along the duodenum, gallbladder fossa, and hepatic flexure is progressive in the interval since the prior CT. Increasing right pleural effusion since prior CT. 3. Duodenum does not cross the midline as expected and appears edematous with wall thickening. No obvious twisting or swirling of the duodenum and stomach is nondilated. Imaging features compatible with component of malrotation although the SMA/SMV orientation appears appropriate 4. Gallbladder is distended with pericholecystic fluid. No definite cholelithiasis. 5. IVC is collapsed but appears patent. This may be related to hypovolemia or mass-effect from the edema/inflammation in the right upper quadrant/right retroperitoneal tissues. 6. No substantial intrahepatic biliary duct dilatation. Common duct measures 7 mm in the hepatoduodenal ligament with common bile duct measuring on the order of 4 mm in the pancreatic head. MRCP imaging is markedly motion degraded with no definite choledocholithiasis. 7. No main pancreatic duct dilatation. No focal pancreatic mass lesion. Pancreas does not appear overtly edematous by MRI. These results will be called to the ordering clinician or representative by the Radiologist Assistant, and communication documented in the PACS or [employer]. Electronically Signed   By: Camellia Candle M.D.   On: 10/15/2024 05:50     LOS: 1 day   Donalda Applebaum, MD  [facility]    To contact the attending provider between 7A-7P or the covering provider during after hours 7P-7A, please log into the web site  [email or web address] and access using universal [place] password for that web site. If you do not have the password, please call the hospital operator.  10/15/2024, 10:28 AM

## 2024-10-15 NOTE — Consult Note (Shared)
 Chief Complaint: liver lesion  Referring Provider(s): Raenelle Grew, MD  Supervising Physician: Luverne Aran  Patient Status: [facility] - In-pt  History of Present Illness: Haley Randall is a 88 y.o. female with medical history significant for HTN and HLD. She presented to the ED with c/o abdominal pain. CT abdomen and pelvis revealed right liver lesions, with one measuring 4.8 x 3.1 cm and additional smaller lesions measuring up to 2.1 cm. Received request for image guided liver lesion biopsy.  Denies fever, chills, SHOB, CP, sore throat, N/V, abd pain, blood in stool or urine, abnormal bruising, leg swelling, back pain.  Reports ***  Allergies Reviewed:  Lisinopril    Patient is Full Code  Past Medical History:  Diagnosis Date   Chest pain    Dizziness    Hyperlipidemia    Hypertension    Idiopathic gout of foot 08/30/2020    Past Surgical History:  Procedure Laterality Date   BREAST SURGERY Left 1980   biopsy      Medications: Prior to Admission medications   Not on File     Family History  Problem Relation Age of Onset   Heart disease Mother    Heart disease Father    Stroke Maternal Grandmother    Stroke Maternal Grandfather    Heart disease Paternal Grandmother    Heart disease Paternal Grandfather    Cancer Neg Hx    Diabetes Neg Hx     Social History   Socioeconomic History   Marital status: Married    Spouse name: Insurance Account Manager   Number of children: 1   Years of education: Not on file   Highest education level: Not on file  Occupational History   Occupation: retired  Tobacco Use   Smoking status: Former   Smokeless tobacco: Never   Tobacco comments:    quit 30 years ago  Vaping Use   Vaping status: Never Used  Substance and Sexual Activity   Alcohol use: Not Currently   Drug use: No   Sexual activity: Not Currently  Other Topics Concern   Not on file  Social History Narrative   05/23/22   From: originally from [place], then [place]     Living: with husband education officer, environmental) and grandson   Work: retired - book keeping      Family: one adopted daughter - [name]      Enjoys: nothing currently      Exercise: not currently   Diet: avoids certain foods - seafood, red meat      Safety   Seat belts: Yes    Guns: No   Safe in relationships: Yes       Social Drivers of Corporate Investment Banker Strain: Low Risk  (08/24/2023)   Overall Financial Resource Strain (CARDIA)    Difficulty of Paying Living Expenses: Not hard at all  Food Insecurity: No Food Insecurity (10/13/2024)   Hunger Vital Sign    Worried About Running Out of Food in the Last Year: Never true    Ran Out of Food in the Last Year: Never true  Transportation Needs: No Transportation Needs (10/13/2024)   PRAPARE - Administrator, Civil Service (Medical): No    Lack of Transportation (Non-Medical): No  Physical Activity: Inactive (08/24/2023)   Exercise Vital Sign    Days of Exercise per Week: 0 days    Minutes of Exercise per Session: 0 min  Stress: No Stress Concern Present (08/24/2023)   [employer] of Occupational  Health - Occupational Stress Questionnaire    Feeling of Stress : Not at all  Social Connections: Socially Isolated (10/13/2024)   Social Connection and Isolation Panel    Frequency of Communication with Friends and Family: Once a week    Frequency of Social Gatherings with Friends and Family: Once a week    Attends Religious Services: Never    Database Administrator or Organizations: No    Attends Banker Meetings: Never    Marital Status: Widowed     Review of Systems: A 12 point ROS discussed and pertinent positives are indicated in the HPI above.  All other systems are negative.    Vital Signs: BP (!) 158/73   Pulse 76   Temp 98.7 F (37.1 C) (Oral)   Resp (!) 22   Ht 4' 11 (1.499 m)   Wt 115 lb 4.8 oz (52.3 kg)   SpO2 96%   BMI 23.29 kg/m   Advance Care Plan: no documents on file   Physical  Exam  Imaging: MR ABDOMEN MRCP W WO CONTAST Result Date: 10/15/2024 CLINICAL DATA:  Pancreatitis.  Liver lesions. EXAM: MRI ABDOMEN WITHOUT AND WITH CONTRAST (INCLUDING MRCP) TECHNIQUE: Multiplanar multisequence MR imaging of the abdomen was performed both before and after the administration of intravenous contrast. Heavily T2-weighted images of the biliary and pancreatic ducts were obtained, and three-dimensional MRCP images were rendered by post processing. CONTRAST:  5mL GADAVIST GADOBUTROL 1 MMOL/ML IV SOLN COMPARISON:  CT scan and abdominal ultrasound from 1 day prior. FINDINGS: Lower chest: Right pleural effusion. Hepatobiliary: Peripheral multicystic enhancing lesions are identified in the liver. 3.6 x 3.0 cm lesion in the hepatic dome seen on image 15/1001. Lateral right hepatic lobe lesion seen on 27/1 1,001 measures 6.3 x 3.8 cm. Mild periportal edema. No substantial intrahepatic biliary duct dilatation. Common duct measures 7 mm in the hepatoduodenal ligament with common bile duct measuring on the order of 4 mm in the pancreatic head. MRCP imaging is markedly motion degraded with no definite choledocholithiasis. Pancreas: No main duct dilatation. No focal pancreatic mass lesion. Pancreas does not appear frankly edematous by MRI. Spleen:  No splenomegaly. No suspicious focal mass lesion. Adrenals/Urinary Tract: No adrenal nodule or mass. Right kidney unremarkable. Multiple simple cysts are identified in the left kidney, lower pole predominant. Stomach/Bowel: Stomach is unremarkable. No gastric wall thickening. No evidence of outlet obstruction. Duodenum appears edematous with wall thickening. As noted on previous CT, the duodenum does not cross the midline as expected, tracking down towards the pelvis anterior to the IVC. No obvious twisting or swirling of the duodenum and stomach is nondilated. The edema/inflammation in the right upper quadrant along the duodenum, gallbladder fossa, and hepatic  flexure is progressive in the interval since the prior CT. Vascular/Lymphatic: Atherosclerotic disease characterizes the abdominal aorta. IVC is collapsed but appears patent. This may be related to hypovolemia or mass-effect from the edema/inflammation in the right upper quadrant and right retroperitoneal tissues. Main portal vein and bifurcation into the left and right portal vein patent. Splenic vein is diminutive but appears to be patent. There is mass-effect on the portal splenic confluence but the SMV does opacified. Other: Extensive edema is identified in the retroperitoneal anatomy of the right abdomen, extending down towards the pelvis and involving the musculature of the lateral right abdominal wall. Musculoskeletal: No focal suspicious marrow enhancement within the visualized bony anatomy. IMPRESSION: 1. Peripheral multicystic enhancing lesions in the liver. Imaging features raise concern for intrahepatic  abscesses. Metastatic disease or primary hepatic neoplasm not excluded. 2. Edema/inflammation in the right upper quadrant along the duodenum, gallbladder fossa, and hepatic flexure is progressive in the interval since the prior CT. Increasing right pleural effusion since prior CT. 3. Duodenum does not cross the midline as expected and appears edematous with wall thickening. No obvious twisting or swirling of the duodenum and stomach is nondilated. Imaging features compatible with component of malrotation although the SMA/SMV orientation appears appropriate 4. Gallbladder is distended with pericholecystic fluid. No definite cholelithiasis. 5. IVC is collapsed but appears patent. This may be related to hypovolemia or mass-effect from the edema/inflammation in the right upper quadrant/right retroperitoneal tissues. 6. No substantial intrahepatic biliary duct dilatation. Common duct measures 7 mm in the hepatoduodenal ligament with common bile duct measuring on the order of 4 mm in the pancreatic head. MRCP  imaging is markedly motion degraded with no definite choledocholithiasis. 7. No main pancreatic duct dilatation. No focal pancreatic mass lesion. Pancreas does not appear overtly edematous by MRI. These results will be called to the ordering clinician or representative by the Radiologist Assistant, and communication documented in the PACS or [employer]. Electronically Signed   By: Camellia Candle M.D.   On: 10/15/2024 05:50   US  Abdomen Limited RUQ (LIVER/GB) Result Date: 10/13/2024 CLINICAL DATA:  Right upper quadrant abdominal pain. EXAM: ULTRASOUND ABDOMEN LIMITED RIGHT UPPER QUADRANT COMPARISON:  None Available. FINDINGS: Gallbladder: Gallbladder wall is upper normal to borderline thickened at 3.1 mm. No echogenic gallstones evident. Pericholecystic fluid evident. Sonographer reports no sonographic Murphy sign. Common bile duct: Diameter: 8 mm Liver: Liver lesion seen on CT earlier today not readily evident by ultrasound. Liver appears heterogeneous and echogenic. Portal vein is patent on color Doppler imaging with normal direction of blood flow towards the liver. Other: None. IMPRESSION: 1. Gallbladder wall is upper normal to borderline thickened at 3.1 mm. No echogenic gallstones evident and no sonographic Murphy sign. As on recent CT, pericholecystic fluid noted. Cholecystitis is not excluded. Correlation for signs/symptoms of cholecystitis recommended. Nuclear scintigraphy may prove helpful to further evaluate as clinically warranted. 2. Common bile duct is dilated at 8 mm. Correlation with liver function test recommended. 3. Liver appears heterogeneous and echogenic. This is a nonspecific finding but can be seen in the setting of hepatic steatosis. 4. Liver lesions seen on CT earlier today not readily evident by ultrasound. Electronically Signed   By: Camellia Candle M.D.   On: 10/13/2024 07:27   CT ABDOMEN PELVIS WO CONTRAST Result Date: 10/13/2024 CLINICAL DATA:  Abdominal pain with nausea.  EXAM: CT ABDOMEN AND PELVIS WITHOUT CONTRAST TECHNIQUE: Multidetector CT imaging of the abdomen and pelvis was performed following the standard protocol without IV contrast. RADIATION DOSE REDUCTION: This exam was performed according to the departmental dose-optimization program which includes automated exposure control, adjustment of the mA and/or kV according to patient size and/or use of iterative reconstruction technique. COMPARISON:  None Available. FINDINGS: Lower chest: Calcified granuloma noted left lower lobe. Hepatobiliary: Small volume perihepatic free fluid with findings suspicious for periportal edema although assessment is limited by lack of intravenous contrast material. 4.8 x 3.1 cm subtle lesion identified right liver on image 15 of series 3. Additional smaller lesions seen towards the dome of the liver measuring up to 2.1 cm on image 9 of series 3. Gallbladder is distended with features suggesting gallbladder wall thickening. No calcified gallstones evident. There is edema/fluid in the region the gallbladder fossa and hepatoduodenal ligament. Although assessment  limited by lack of intravenous contrast material, no gross dilatation of the common bile duct. Pancreas: No definite pancreatic mass.  No main duct dilatation. Spleen: No splenomegaly. No suspicious focal mass lesion. Adrenals/Urinary Tract: No adrenal nodule or mass. Right kidney unremarkable. Low-density lesions in the left kidney approach water density and are likely cysts. No evidence for hydroureter. Bladder unremarkable. Stomach/Bowel: Stomach is unremarkable. No gastric wall thickening. No evidence of outlet obstruction. Duodenum passes through the area of edema/inflammatory change of the right upper quadrant. Duodenum does not cross the midline as expected suggesting a component of small bowel malrotation. No small bowel wall thickening. No small bowel dilatation. Despite the course of the duodenum, the cecum is positioned in the  right lower quadrant. The terminal ileum is normal. The appendix is normal. There is some fluid adjacent to the distal ascending colon and hepatic flexure. Diverticular disease is noted in the left colon without diverticulitis. Vascular/Lymphatic: There is advanced atherosclerotic calcification of the abdominal aorta without aneurysm. No abdominal lymphadenopathy. No pelvic lymphadenopathy. Reproductive: Uterine fibroids evident.  There is no adnexal mass. Other: No substantial free fluid in the pelvis. Musculoskeletal: No worrisome lytic or sclerotic osseous abnormality. IMPRESSION: 1. 4.8 x 3.1 cm subtle lesion identified right liver with additional smaller lesions seen towards the dome of the liver measuring up to 2.1 cm. Imaging features are indeterminate. MRI of the abdomen with and without contrast recommended to further evaluate as metastatic disease or primary hepatic neoplasm not excluded. 2. Gallbladder is distended with features suggesting ill-defined gallbladder wall thickening on this noncontrast study. No calcified gallstones evident. There is edema/fluid in the region the gallbladder fossa and hepatoduodenal ligament. Imaging features are nonspecific but could be seen in the setting of acute cholecystitis. Right upper quadrant ultrasound may prove helpful to further evaluate. 3. Edema/inflammation is identified along the distal ascending colon and hepatic flexure, potentially secondary to changes described above in the gallbladder fossa. No associated colonic wall thickening to suggest colon represents etiology of these findings, but colitis is not excluded. 4. Small volume perihepatic free fluid with question of periportal edema. 5. Left colonic diverticulosis without diverticulitis. 6. Uterine fibroids. 7.  Aortic Atherosclerosis (ICD10-I70.0). Electronically Signed   By: Camellia Candle M.D.   On: 10/13/2024 06:41   DG Chest Port 1 View Result Date: 10/13/2024 CLINICAL DATA:  Possible sepsis.  EXAM: PORTABLE CHEST 1 VIEW COMPARISON:  None Available. FINDINGS: The lungs are clear without focal pneumonia, edema, pneumothorax or pleural effusion. Biapical pleuroparenchymal scarring evident. Cardiopericardial silhouette is at upper limits of normal for size. No acute bony abnormality. Telemetry leads overlie the chest. IMPRESSION: No active disease. Electronically Signed   By: Camellia Candle M.D.   On: 10/13/2024 05:46    Labs:  CBC: Recent Labs    10/13/24 0517 10/13/24 0518 10/14/24 0237 10/15/24 0344  WBC 17.4*  --  14.1* 11.4*  HGB 10.8* 11.6* 10.0* 9.1*  HCT 34.6* 34.0* 31.8* 28.7*  PLT 209  --  131* 121*    COAGS: Recent Labs    10/13/24 0517  INR 1.1    BMP: Recent Labs    10/13/24 0517 10/13/24 0518 10/14/24 0237 10/15/24 0344  NA 140 141 140 137  K 3.9 3.9 4.0 3.2*  CL 108 110 111 110  CO2 19*  --  17* 18*  GLUCOSE 201* 203* 214* 117*  BUN 18 18 21 19   CALCIUM  9.4  --  8.8* 8.2*  CREATININE 1.44* 1.40* 1.79* 1.57*  GFRNONAA 35*  --  27* 32*    LIVER FUNCTION TESTS: Recent Labs    10/13/24 0517 10/14/24 0237 10/15/24 0344  BILITOT 1.7* 2.0* 2.4*  AST 293* 372* 135*  ALT 102* 225* 134*  ALKPHOS 88 62 50  PROT 6.6 5.7* 5.0*  ALBUMIN 3.5 3.0* 2.4*    TUMOR MARKERS: No results for input(s): AFPTM, CEA, CA199, CHROMGRNA in the last 8760 hours.  Assessment and Plan: Liver lesion-  Request for image guided liver lesion biopsy Approved by Dr Luverne. Tentatively planned for Monday, 10/17/2024. No contraindications for procedure identified in ROS, physical exam, or review of pre-sedation considerations.  Labs reviewed and within acceptable range Imaging available and reviewed VSS, afebrile ***On Lovenox Abx not indicated   Risks and benefits of image guided aspiration and biopsy were discussed with the patient including bleeding, infection, damage to adjacent structures, and sepsis.  All of the patient's questions were  answered, patient is agreeable to proceed. Consent signed and in chart.  ***  Thank you for allowing our service to participate in Rain Wilhide 's care.    Electronically Signed: Fleurette Woolbright B Sabas Frett, NP   10/15/2024, 1:35 PM     I spent a total of 20 Minutes in face to face in clinical consultation, greater than 50% of which was counseling/coordinating care for image guided liver lesion biopsy.   (A copy of this note was sent to the referring provider and the time of visit.)

## 2024-10-16 DIAGNOSIS — R109 Unspecified abdominal pain: Secondary | ICD-10-CM | POA: Diagnosis not present

## 2024-10-16 LAB — COMPREHENSIVE METABOLIC PANEL WITH GFR
ALT: 83 U/L — ABNORMAL HIGH (ref 0–44)
AST: 61 U/L — ABNORMAL HIGH (ref 15–41)
Albumin: 2.2 g/dL — ABNORMAL LOW (ref 3.5–5.0)
Alkaline Phosphatase: 57 U/L (ref 38–126)
Anion gap: 12 (ref 5–15)
BUN: 17 mg/dL (ref 8–23)
CO2: 17 mmol/L — ABNORMAL LOW (ref 22–32)
Calcium: 8.6 mg/dL — ABNORMAL LOW (ref 8.9–10.3)
Chloride: 108 mmol/L (ref 98–111)
Creatinine, Ser: 1.45 mg/dL — ABNORMAL HIGH (ref 0.44–1.00)
GFR, Estimated: 35 mL/min — ABNORMAL LOW (ref >60)
Glucose, Bld: 92 mg/dL (ref 70–99)
Potassium: 3.8 mmol/L (ref 3.5–5.1)
Sodium: 137 mmol/L (ref 135–145)
Total Bilirubin: 2.4 mg/dL — ABNORMAL HIGH (ref 0.0–1.2)
Total Protein: 5.3 g/dL — ABNORMAL LOW (ref 6.5–8.1)

## 2024-10-16 LAB — CBC WITH DIFFERENTIAL/PLATELET
Basophils Absolute: 0 K/uL (ref 0.0–0.1)
Basophils Relative: 0 %
Eosinophils Absolute: 0 K/uL (ref 0.0–0.5)
Eosinophils Relative: 0 %
HCT: 27.3 % — ABNORMAL LOW (ref 36.0–46.0)
Hemoglobin: 8.6 g/dL — ABNORMAL LOW (ref 12.0–15.0)
Lymphocytes Relative: 5 %
Lymphs Abs: 0.4 K/uL — ABNORMAL LOW (ref 0.7–4.0)
MCH: 26 pg (ref 26.0–34.0)
MCHC: 31.5 g/dL (ref 30.0–36.0)
MCV: 82.5 fL (ref 80.0–100.0)
Monocytes Absolute: 0.2 K/uL (ref 0.1–1.0)
Monocytes Relative: 2 %
Neutro Abs: 7.9 K/uL — ABNORMAL HIGH (ref 1.7–7.7)
Neutrophils Relative %: 93 %
Platelets: 125 K/uL — ABNORMAL LOW (ref 150–400)
RBC: 3.31 MIL/uL — ABNORMAL LOW (ref 3.87–5.11)
RDW: 16.4 % — ABNORMAL HIGH (ref 11.5–15.5)
WBC: 8.5 K/uL (ref 4.0–10.5)
nRBC: 0.4 % — ABNORMAL HIGH (ref 0.0–0.2)

## 2024-10-16 LAB — ABO/RH: ABO/RH(D): AB POS

## 2024-10-16 LAB — MAGNESIUM: Magnesium: 2.2 mg/dL (ref 1.7–2.4)

## 2024-10-16 MED ORDER — IBUPROFEN 400 MG PO TABS: 200.0000 mg | ORAL_TABLET | ORAL | Status: DC | PRN

## 2024-10-16 MED ORDER — ENOXAPARIN SODIUM 30 MG/0.3ML IJ SOSY: 30.0000 mg | PREFILLED_SYRINGE | Freq: Every day | INTRAMUSCULAR | Status: DC

## 2024-10-16 NOTE — Plan of Care (Signed)
  Problem: Health Behavior/Discharge Planning: Goal: Ability to manage health-related needs will improve Outcome: Progressing   Problem: Activity: Goal: Risk for activity intolerance will decrease Outcome: Progressing   Problem: Nutrition: Goal: Adequate nutrition will be maintained Outcome: Progressing   Problem: Coping: Goal: Level of anxiety will decrease Outcome: Progressing   Problem: Elimination: Goal: Will not experience complications related to bowel motility Outcome: Progressing Goal: Will not experience complications related to urinary retention Outcome: Progressing   

## 2024-10-16 NOTE — Progress Notes (Signed)
 PROGRESS NOTE        PATIENT DETAILS Name: Haley Randall Age: 88 y.o. Sex: female Date of Birth: 06/16/1936 Admit Date: 10/13/2024 Admitting Physician [name], MD ERE:Ilhjo, Ginger, FNP  Brief Summary: Patient is a 88 y.o.  female with history of HTN, HLD-who presented with abdominal pain-found to have pancreatitis/vague liver lesions on CT abdomen.  Significant events: 11/13>> admit to [facility]  Significant studies: 11/13>> CXR: No PNA 11/13>> CT abdomen/pelvis: 4.8 x 3.1 cm subtle lesion-right liver-additional small lesions measuring up to 2.1 cm.  Gallbladder distended.  Edema/inflammation along the descending ascending colon/hepatic flexure. 11/13>> RUQ ultrasound: Gallbladder wall upper normal to borderline thickened, no echogenic gallstones.  CBD 8 mm. 11/14>> MRI abdomen: Multicystic enhancing lesions in the liver-abscesses versus metastatic disease  Significant microbiology data: 11/13>> COVID/influenza/RSV PCR: Negative 11/13>> urine culture: Multiple species 11/13>> 1/2: Gram-positive cocci (prelim Staph epidermidis)-likely contamination. 11/14>> acute hepatitis serology: Negative 11/14>> RPR: Nonreactive  Procedures: None  Consults: General surgery IR  Subjective:  Patient in bed, appears comfortable, denies any headache, no fever, no chest pain or pressure, no shortness of breath , no abdominal pain. No new focal weakness.   Objective: Vitals: Blood pressure (!) 138/46, pulse 65, temperature 98.6 F (37 C), temperature source Oral, resp. rate 16, height 4' 11 (1.499 m), weight 52.3 kg, SpO2 94%.   Exam:  Awake Alert, No new F.N deficits, Normal affect [place].AT,PERRAL Supple Neck, No JVD,   Symmetrical Chest wall movement, Good air movement bilaterally, CTAB RRR,No Gallops, Rubs or new Murmurs,  +ve B.Sounds, Abd Soft, minimal upper quadrant tenderness,   No Cyanosis, Clubbing or edema     Assessment/Plan: Acute  pancreatitis No gallstones seen on imaging studies-no history of EtOH use-?  Related to malignancy (liver lesion seen on imaging studies).  MRCP negative for choledocholithiasis-no major pancreatic abnormality on MRI. Clinically slowly improving Continue IVF Advance to full liquid diet today  Acute metabolic encephalopathy Unclear etiology-?  Colitis/fentanyl that she received in the emergency room-superimposed on some cognitive issues at baseline (daughter acknowledges mild cognitive issues at baseline) Currently much more awake alert-no signs of confusion this morning Maintain delirium precautions  Transaminitis Potentially due to mass effect from liver lesions-alternatively could could have passed a small stone Acute hepatitis serology negative Transaminases downtrending MRCP as above Trend LFTs Continue supportive care  Multicystic enhancing liver lesions on MRCP abdomen  Unclear whether this is abscess (does have colitis on CT imaging) versus metastatic disease Continue Zosyn Discussed with IR-PA-C-Charles Karim 11/15-biopsy planned for 11/17-Will need pathology and also Gram stain/culture sensitivity, appropriate orders also placed, checking INR and type screen as well.  Will hold Lovenox on the 10/17/2024  ? Colitis No diarrhea Given liver masses-this could be a real issue that has caused liver abscesses. Continue Zosyn See above regarding plan liver biopsy 11/17.  HTN BP slowly creeping up Start amlodipine  and see how she does  Hypomagnesemia Repleted  Hypokalemia Replete/recheck  Gram-positive bacteremia-coag negative staph (preliminary result)  likely a contaminant Await final results-doubt needs to be treated at this point.  ?  Mild dementia/cognitive issues B12/TSH/RPR all stable Ammonia level is only minimally elevated-doubt of any clinical significance. No confusion this morning-continue to maintain delirium precautions  Palliative care Long discussion  with patient/daughter at bedside-regarding MRI abdomen findings-abscess versus metastatic disease.  They seem okay with proceeding with  a liver biopsy to differentiate between the two, however if it turns out that this is in fact malignancy-they may not want any further workup/treatment.  I have asked them to continue discussion amongst themselves, our service will follow tomorrow-and continue to clarify on goals of care.  Code status:   Code Status: Full Code   DVT Prophylaxis: enoxaparin (LOVENOX) injection 30 mg Start: 10/14/24 1000   Family Communication: Daughter at bedside   Disposition Plan: Status is: Observation The patient will require care spanning > 2 midnights and should be moved to inpatient because: Severity of illness   Planned Discharge Destination:Home   Diet: Diet Order             Diet full liquid Fluid consistency: Thin  Diet effective now                    MEDICATIONS: Scheduled Meds:  amLODipine   5 mg Oral Daily   enoxaparin (LOVENOX) injection  30 mg Subcutaneous Daily   feeding supplement  1 Container Oral TID BM   pantoprazole (PROTONIX) IV  40 mg Intravenous Q12H   Continuous Infusions:  sodium chloride 75 mL/hr at 10/16/24 0509   piperacillin-tazobactam (ZOSYN)  IV 3.375 g (10/16/24 0507)   PRN Meds:.albuterol, fentaNYL (SUBLIMAZE) injection, hydrALAZINE, hydrALAZINE, ondansetron **OR** ondansetron (ZOFRAN) IV, oxyCODONE   I have personally reviewed following labs and imaging studies  LABORATORY DATA:   Data Review:   Inpatient Medications  Scheduled Meds:  amLODipine   5 mg Oral Daily   enoxaparin (LOVENOX) injection  30 mg Subcutaneous Daily   feeding supplement  1 Container Oral TID BM   pantoprazole (PROTONIX) IV  40 mg Intravenous Q12H   Continuous Infusions:  sodium chloride 75 mL/hr at 10/16/24 0509   piperacillin-tazobactam (ZOSYN)  IV 3.375 g (10/16/24 0507)   PRN Meds:.albuterol, fentaNYL (SUBLIMAZE) injection,  hydrALAZINE, hydrALAZINE, ondansetron **OR** ondansetron (ZOFRAN) IV, oxyCODONE  DVT Prophylaxis  enoxaparin (LOVENOX) injection 30 mg Start: 10/14/24 1000   Recent Labs  Lab 10/13/24 0517 10/13/24 0518 10/14/24 0237 10/15/24 0344 10/16/24 0627  WBC 17.4*  --  14.1* 11.4* 8.5  HGB 10.8* 11.6* 10.0* 9.1* 8.6*  HCT 34.6* 34.0* 31.8* 28.7* 27.3*  PLT 209  --  131* 121* 125*  MCV 82.8  --  81.5 80.8 82.5  MCH 25.8*  --  25.6* 25.6* 26.0  MCHC 31.2  --  31.4 31.7 31.5  RDW 15.3  --  15.9* 16.0* 16.4*  LYMPHSABS 1.6  --   --   --  0.4*  MONOABS 0.7  --   --   --  0.2  EOSABS 0.0  --   --   --  0.0  BASOSABS 0.0  --   --   --  0.0    Recent Labs  Lab 10/13/24 0517 10/13/24 0518 10/13/24 0640 10/13/24 1756 10/14/24 0237 10/14/24 1614 10/15/24 0344 10/16/24 0627  NA 140 141  --   --  140  --  137 137  K 3.9 3.9  --   --  4.0  --  3.2* 3.8  CL 108 110  --   --  111  --  110 108  CO2 19*  --   --   --  17*  --  18* 17*  ANIONGAP 13  --   --   --  12  --  9 12  GLUCOSE 201* 203*  --   --  214*  --  117* 92  BUN 18 18  --   --  21  --  19 17  CREATININE 1.44* 1.40*  --   --  1.79*  --  1.57* 1.45*  AST 293*  --   --   --  372*  --  135* 61*  ALT 102*  --   --   --  225*  --  134* 83*  ALKPHOS 88  --   --   --  62  --  50 57  BILITOT 1.7*  --   --   --  2.0*  --  2.4* 2.4*  ALBUMIN 3.5  --   --   --  3.0*  --  2.4* 2.2*  LATICACIDVEN  --  2.7* 1.8  --   --   --   --   --   INR 1.1  --   --   --   --   --   --   --   TSH  --   --   --   --   --  1.241  --   --   AMMONIA  --   --   --  37*  --   --   --   --   MG  --   --   --   --  1.6*  --  2.7* 2.2  CALCIUM  9.4  --   --   --  8.8*  --  8.2* 8.6*      Recent Labs  Lab 10/13/24 0517 10/13/24 0518 10/13/24 0640 10/13/24 1756 10/14/24 0237 10/14/24 1614 10/15/24 0344 10/16/24 0627  LATICACIDVEN  --  2.7* 1.8  --   --   --   --   --   INR 1.1  --   --   --   --   --   --   --   TSH  --   --   --   --   --  1.241   --   --   AMMONIA  --   --   --  37*  --   --   --   --   MG  --   --   --   --  1.6*  --  2.7* 2.2  CALCIUM  9.4  --   --   --  8.8*  --  8.2* 8.6*    --------------------------------------------------------------------------------------------------------------- Lab Results  Component Value Date   CHOL 157 09/01/2023   HDL 86 09/01/2023   LDLCALC 57 09/01/2023   TRIG 74 09/01/2023   CHOLHDL 1.8 09/01/2023    Lab Results  Component Value Date   HGBA1C 6.2 10/02/2022   Recent Labs    10/14/24 1614  TSH 1.241   Recent Labs    10/14/24 1614  VITAMINB12 481   ------------------------------------------------------------------------------------------------------------------ Cardiac Enzymes No results for input(s): CKMB, TROPONINI, MYOGLOBIN in the last 168 hours.  Invalid input(s): CK  Micro Results Recent Results (from the past 240 hours)  Blood Culture (routine x 2)     Status: None (Preliminary result)   Collection Time: 10/13/24  5:17 AM   Specimen: BLOOD  Result Value Ref Range Status   Specimen Description BLOOD BLOOD LEFT ARM  Final   Special Requests   Final    BOTTLES DRAWN AEROBIC AND ANAEROBIC Blood Culture adequate volume   Culture   Final    NO GROWTH 2 DAYS Performed at [facility], [address], [place],  [place] [address]    Report Status PENDING  Incomplete  Blood Culture (routine x 2)     Status: Abnormal   Collection Time: 10/13/24  5:17 AM   Specimen: BLOOD  Result Value Ref Range Status   Specimen Description BLOOD BLOOD LEFT HAND  Final   Special Requests   Final    BOTTLES DRAWN AEROBIC ONLY Blood Culture results may not be optimal due to an inadequate volume of blood received in culture bottles   Culture  Setup Time   Final    GRAM POSITIVE COCCI IN CLUSTERS AEROBIC BOTTLE ONLY CRITICAL RESULT CALLED TO, READ BACK BY AND VERIFIED WITH: PHARMD Jeremy on [phone] @1010  by SM    Culture (A)  Final    STAPHYLOCOCCUS  EPIDERMIDIS THE SIGNIFICANCE OF ISOLATING THIS ORGANISM FROM A SINGLE SET OF BLOOD CULTURES WHEN MULTIPLE SETS ARE DRAWN IS UNCERTAIN. PLEASE NOTIFY THE MICROBIOLOGY DEPARTMENT WITHIN ONE WEEK IF SPECIATION AND SENSITIVITIES ARE REQUIRED. Performed at [facility], [address] [place], [place] [address]    Report Status 10/15/2024 FINAL  Final  Blood Culture ID Panel (Reflexed)     Status: Abnormal   Collection Time: 10/13/24  5:17 AM  Result Value Ref Range Status   Enterococcus faecalis NOT DETECTED NOT DETECTED Final   Enterococcus Faecium NOT DETECTED NOT DETECTED Final   Listeria monocytogenes NOT DETECTED NOT DETECTED Final   Staphylococcus species DETECTED (A) NOT DETECTED Final    Comment: CRITICAL RESULT CALLED TO, READ BACK BY AND VERIFIED WITH: MAYA Ditch on [phone] @1010  by SM    Staphylococcus aureus (BCID) NOT DETECTED NOT DETECTED Final   Staphylococcus epidermidis DETECTED (A) NOT DETECTED Final    Comment: CRITICAL RESULT CALLED TO, READ BACK BY AND VERIFIED WITH: PHARMD Jeremy on [phone] @1010  by SM    Staphylococcus lugdunensis NOT DETECTED NOT DETECTED Final   Streptococcus species NOT DETECTED NOT DETECTED Final   Streptococcus agalactiae NOT DETECTED NOT DETECTED Final   Streptococcus pneumoniae NOT DETECTED NOT DETECTED Final   Streptococcus pyogenes NOT DETECTED NOT DETECTED Final   A.calcoaceticus-baumannii NOT DETECTED NOT DETECTED Final   Bacteroides fragilis NOT DETECTED NOT DETECTED Final   Enterobacterales NOT DETECTED NOT DETECTED Final   Enterobacter cloacae complex NOT DETECTED NOT DETECTED Final   Escherichia coli NOT DETECTED NOT DETECTED Final   Klebsiella aerogenes NOT DETECTED NOT DETECTED Final   Klebsiella oxytoca NOT DETECTED NOT DETECTED Final   Klebsiella pneumoniae NOT DETECTED NOT DETECTED Final   Proteus species NOT DETECTED NOT DETECTED Final   Salmonella species NOT DETECTED NOT DETECTED Final   Serratia marcescens NOT  DETECTED NOT DETECTED Final   Haemophilus influenzae NOT DETECTED NOT DETECTED Final   Neisseria meningitidis NOT DETECTED NOT DETECTED Final   Pseudomonas aeruginosa NOT DETECTED NOT DETECTED Final   Stenotrophomonas maltophilia NOT DETECTED NOT DETECTED Final   Candida albicans NOT DETECTED NOT DETECTED Final   Candida auris NOT DETECTED NOT DETECTED Final   Candida glabrata NOT DETECTED NOT DETECTED Final   Candida krusei NOT DETECTED NOT DETECTED Final   Candida parapsilosis NOT DETECTED NOT DETECTED Final   Candida tropicalis NOT DETECTED NOT DETECTED Final   Cryptococcus neoformans/gattii NOT DETECTED NOT DETECTED Final   Methicillin resistance mecA/C NOT DETECTED NOT DETECTED Final    Comment: Performed at [facility], [address], [place], [place] [address]  Resp panel by RT-PCR (RSV, Flu A&B, Covid) Anterior Nasal Swab     Status:  None   Collection Time: 10/13/24  7:14 AM   Specimen: Anterior Nasal Swab  Result Value Ref Range Status   SARS Coronavirus 2 by RT PCR NEGATIVE NEGATIVE Final   Influenza A by PCR NEGATIVE NEGATIVE Final   Influenza B by PCR NEGATIVE NEGATIVE Final    Comment: (NOTE) The Xpert Xpress SARS-CoV-2/FLU/RSV plus assay is intended as an aid in the diagnosis of influenza from Nasopharyngeal swab specimens and should not be used as a sole basis for treatment. Nasal washings and aspirates are unacceptable for Xpert Xpress SARS-CoV-2/FLU/RSV testing.  Fact Sheet for Patients: bloggercourse.com  Fact Sheet for Healthcare Providers: seriousbroker.it  This test is not yet approved or cleared by the [place]  FDA and has been authorized for detection and/or diagnosis of SARS-CoV-2 by FDA under an Emergency Use Authorization (EUA). This EUA will remain in effect (meaning this test can be used) for the duration of the COVID-19 declaration under Section 564(b)(1) of the Act, 21 U.S.C. section  360bbb-3(b)(1), unless the authorization is terminated or revoked.     Resp Syncytial Virus by PCR NEGATIVE NEGATIVE Final    Comment: (NOTE) Fact Sheet for Patients: bloggercourse.com  Fact Sheet for Healthcare Providers: seriousbroker.it  This test is not yet approved or cleared by the [place]  FDA and has been authorized for detection and/or diagnosis of SARS-CoV-2 by FDA under an Emergency Use Authorization (EUA). This EUA will remain in effect (meaning this test can be used) for the duration of the COVID-19 declaration under Section 564(b)(1) of the Act, 21 U.S.C. section 360bbb-3(b)(1), unless the authorization is terminated or revoked.  Performed at [facility], [address] [place], [place] [address]   Urine Culture (for pregnant, neutropenic or urologic patients or patients with an indwelling urinary catheter)     Status: Abnormal   Collection Time: 10/13/24  9:25 AM   Specimen: Urine, Clean Catch  Result Value Ref Range Status   Specimen Description URINE, CLEAN CATCH  Final   Special Requests   Final    NONE Performed at [facility], [address] [place], [place] [address]    Culture MULTIPLE SPECIES PRESENT, SUGGEST RECOLLECTION (A)  Final   Report Status 10/14/2024 FINAL  Final    Radiology Reports  MR ABDOMEN MRCP W WO CONTAST Result Date: 10/15/2024 CLINICAL DATA:  Pancreatitis.  Liver lesions. EXAM: MRI ABDOMEN WITHOUT AND WITH CONTRAST (INCLUDING MRCP) TECHNIQUE: Multiplanar multisequence MR imaging of the abdomen was performed both before and after the administration of intravenous contrast. Heavily T2-weighted images of the biliary and pancreatic ducts were obtained, and three-dimensional MRCP images were rendered by post processing. CONTRAST:  5mL GADAVIST GADOBUTROL 1 MMOL/ML IV SOLN COMPARISON:  CT scan and abdominal ultrasound from 1 day prior. FINDINGS: Lower chest: Right pleural  effusion. Hepatobiliary: Peripheral multicystic enhancing lesions are identified in the liver. 3.6 x 3.0 cm lesion in the hepatic dome seen on image 15/1001. Lateral right hepatic lobe lesion seen on 27/1 1,001 measures 6.3 x 3.8 cm. Mild periportal edema. No substantial intrahepatic biliary duct dilatation. Common duct measures 7 mm in the hepatoduodenal ligament with common bile duct measuring on the order of 4 mm in the pancreatic head. MRCP imaging is markedly motion degraded with no definite choledocholithiasis. Pancreas: No main duct dilatation. No focal pancreatic mass lesion. Pancreas does not appear frankly edematous by MRI. Spleen:  No splenomegaly. No suspicious focal mass lesion. Adrenals/Urinary Tract: No adrenal nodule or mass. Right kidney unremarkable.  Multiple simple cysts are identified in the left kidney, lower pole predominant. Stomach/Bowel: Stomach is unremarkable. No gastric wall thickening. No evidence of outlet obstruction. Duodenum appears edematous with wall thickening. As noted on previous CT, the duodenum does not cross the midline as expected, tracking down towards the pelvis anterior to the IVC. No obvious twisting or swirling of the duodenum and stomach is nondilated. The edema/inflammation in the right upper quadrant along the duodenum, gallbladder fossa, and hepatic flexure is progressive in the interval since the prior CT. Vascular/Lymphatic: Atherosclerotic disease characterizes the abdominal aorta. IVC is collapsed but appears patent. This may be related to hypovolemia or mass-effect from the edema/inflammation in the right upper quadrant and right retroperitoneal tissues. Main portal vein and bifurcation into the left and right portal vein patent. Splenic vein is diminutive but appears to be patent. There is mass-effect on the portal splenic confluence but the SMV does opacified. Other: Extensive edema is identified in the retroperitoneal anatomy of the right abdomen, extending  down towards the pelvis and involving the musculature of the lateral right abdominal wall. Musculoskeletal: No focal suspicious marrow enhancement within the visualized bony anatomy. IMPRESSION: 1. Peripheral multicystic enhancing lesions in the liver. Imaging features raise concern for intrahepatic abscesses. Metastatic disease or primary hepatic neoplasm not excluded. 2. Edema/inflammation in the right upper quadrant along the duodenum, gallbladder fossa, and hepatic flexure is progressive in the interval since the prior CT. Increasing right pleural effusion since prior CT. 3. Duodenum does not cross the midline as expected and appears edematous with wall thickening. No obvious twisting or swirling of the duodenum and stomach is nondilated. Imaging features compatible with component of malrotation although the SMA/SMV orientation appears appropriate 4. Gallbladder is distended with pericholecystic fluid. No definite cholelithiasis. 5. IVC is collapsed but appears patent. This may be related to hypovolemia or mass-effect from the edema/inflammation in the right upper quadrant/right retroperitoneal tissues. 6. No substantial intrahepatic biliary duct dilatation. Common duct measures 7 mm in the hepatoduodenal ligament with common bile duct measuring on the order of 4 mm in the pancreatic head. MRCP imaging is markedly motion degraded with no definite choledocholithiasis. 7. No main pancreatic duct dilatation. No focal pancreatic mass lesion. Pancreas does not appear overtly edematous by MRI. These results will be called to the ordering clinician or representative by the Radiologist Assistant, and communication documented in the PACS or [employer]. Electronically Signed   By: Camellia Candle M.D.   On: 10/15/2024 05:50      Signature  -   Lavada Stank M.D on 10/16/2024 at 8:39 AM   -  To page go to [email or web address]

## 2024-10-16 NOTE — Care Plan (Addendum)
 Patient up to chair for lunch / fall mat in place / scd's on / chair alarm on / call bell in reach

## 2024-10-17 ENCOUNTER — Inpatient Hospital Stay (HOSPITAL_COMMUNITY)

## 2024-10-17 DIAGNOSIS — R109 Unspecified abdominal pain: Secondary | ICD-10-CM | POA: Diagnosis not present

## 2024-10-17 LAB — CBC WITH DIFFERENTIAL/PLATELET
Basophils Absolute: 0 K/uL (ref 0.0–0.1)
Basophils Relative: 0 %
Eosinophils Absolute: 0.1 K/uL (ref 0.0–0.5)
Eosinophils Relative: 1 %
HCT: 25.2 % — ABNORMAL LOW (ref 36.0–46.0)
Hemoglobin: 8.2 g/dL — ABNORMAL LOW (ref 12.0–15.0)
Lymphocytes Relative: 5 %
Lymphs Abs: 0.4 K/uL — ABNORMAL LOW (ref 0.7–4.0)
MCH: 26.1 pg (ref 26.0–34.0)
MCHC: 32.5 g/dL (ref 30.0–36.0)
MCV: 80.3 fL (ref 80.0–100.0)
Monocytes Absolute: 0.6 K/uL (ref 0.1–1.0)
Monocytes Relative: 8 %
Neutro Abs: 6.9 K/uL (ref 1.7–7.7)
Neutrophils Relative %: 86 %
Platelets: 153 K/uL (ref 150–400)
RBC: 3.14 MIL/uL — ABNORMAL LOW (ref 3.87–5.11)
RDW: 16.3 % — ABNORMAL HIGH (ref 11.5–15.5)
WBC: 8 K/uL (ref 4.0–10.5)
nRBC: 0.6 % — ABNORMAL HIGH (ref 0.0–0.2)

## 2024-10-17 LAB — COMPREHENSIVE METABOLIC PANEL WITH GFR
ALT: 58 U/L — ABNORMAL HIGH (ref 0–44)
AST: 40 U/L (ref 15–41)
Albumin: 2 g/dL — ABNORMAL LOW (ref 3.5–5.0)
Alkaline Phosphatase: 61 U/L (ref 38–126)
Anion gap: 10 (ref 5–15)
BUN: 16 mg/dL (ref 8–23)
CO2: 19 mmol/L — ABNORMAL LOW (ref 22–32)
Calcium: 8.5 mg/dL — ABNORMAL LOW (ref 8.9–10.3)
Chloride: 108 mmol/L (ref 98–111)
Creatinine, Ser: 1.26 mg/dL — ABNORMAL HIGH (ref 0.44–1.00)
GFR, Estimated: 42 mL/min — ABNORMAL LOW (ref >60)
Glucose, Bld: 108 mg/dL — ABNORMAL HIGH (ref 70–99)
Potassium: 3.3 mmol/L — ABNORMAL LOW (ref 3.5–5.1)
Sodium: 137 mmol/L (ref 135–145)
Total Bilirubin: 1.7 mg/dL — ABNORMAL HIGH (ref 0.0–1.2)
Total Protein: 5.1 g/dL — ABNORMAL LOW (ref 6.5–8.1)

## 2024-10-17 LAB — PROTIME-INR
INR: 1.1 (ref 0.8–1.2)
Prothrombin Time: 15.2 s (ref 11.4–15.2)

## 2024-10-17 LAB — MAGNESIUM: Magnesium: 2 mg/dL (ref 1.7–2.4)

## 2024-10-17 MED ORDER — LIDOCAINE 1 % OPTIME INJ - NO CHARGE
10.0000 mL | Freq: Once | INTRAMUSCULAR | Status: AC
  Administered 2024-10-17: 10 mL via INTRADERMAL
  Filled 2024-10-17: qty 10

## 2024-10-17 MED ORDER — DILTIAZEM HCL 25 MG/5ML IV SOLN: 10.0000 mg | Freq: Four times a day (QID) | INTRAVENOUS | Status: DC | PRN

## 2024-10-17 MED ORDER — MIDAZOLAM HCL (PF) 2 MG/2ML IJ SOLN
INTRAMUSCULAR | Status: AC | PRN
  Administered 2024-10-17: .5 mg via INTRAVENOUS

## 2024-10-17 MED ORDER — MIDAZOLAM HCL 2 MG/2ML IJ SOLN
INTRAMUSCULAR | Status: AC
  Filled 2024-10-17: qty 2

## 2024-10-17 MED ORDER — FENTANYL CITRATE (PF) 100 MCG/2ML IJ SOLN
INTRAMUSCULAR | Status: AC
  Filled 2024-10-17: qty 2

## 2024-10-17 MED ORDER — HYDRALAZINE HCL 20 MG/ML IJ SOLN
INTRAMUSCULAR | Status: AC
  Filled 2024-10-17: qty 1

## 2024-10-17 MED ORDER — FUROSEMIDE 10 MG/ML IJ SOLN
40.0000 mg | Freq: Once | INTRAMUSCULAR | Status: AC
  Administered 2024-10-17: 40 mg via INTRAVENOUS
  Filled 2024-10-17: qty 4

## 2024-10-17 MED ORDER — HYDRALAZINE HCL 20 MG/ML IJ SOLN
10.0000 mg | Freq: Four times a day (QID) | INTRAMUSCULAR | Status: DC | PRN
  Administered 2024-10-19 – 2024-10-28 (×5): 10 mg via INTRAVENOUS
  Filled 2024-10-17 (×6): qty 1

## 2024-10-17 MED ORDER — POTASSIUM CHLORIDE CRYS ER 20 MEQ PO TBCR
40.0000 meq | EXTENDED_RELEASE_TABLET | Freq: Two times a day (BID) | ORAL | Status: AC
  Administered 2024-10-17 (×2): 40 meq via ORAL
  Filled 2024-10-17 (×2): qty 2

## 2024-10-17 MED ORDER — ENOXAPARIN SODIUM 30 MG/0.3ML IJ SOSY
30.0000 mg | PREFILLED_SYRINGE | Freq: Every day | INTRAMUSCULAR | Status: DC
  Administered 2024-10-18 – 2024-10-31 (×12): 30 mg via SUBCUTANEOUS
  Filled 2024-10-17 (×12): qty 0.3

## 2024-10-17 MED ORDER — METOPROLOL TARTRATE 50 MG PO TABS
50.0000 mg | ORAL_TABLET | Freq: Two times a day (BID) | ORAL | Status: DC
  Administered 2024-10-17 – 2024-10-29 (×25): 50 mg via ORAL
  Filled 2024-10-17 (×26): qty 1

## 2024-10-17 MED ORDER — FENTANYL CITRATE (PF) 100 MCG/2ML IJ SOLN
INTRAMUSCULAR | Status: AC | PRN
  Administered 2024-10-17: 25 ug via INTRAVENOUS

## 2024-10-17 NOTE — TOC Progression Note (Signed)
 Transition of Care [facility]) - Progression Note    Patient Details  Name: Haley Randall MRN: [id number] Date of Birth: 03/10/1936  [facility] CM/SW Contact  Andrez JULIANNA George, RN Phone Number: 10/17/2024, 3:16 PM  Clinical Narrative:      Pt is s/p: CT guided biopsy of right lobe liver mass  She continues of IV abx.  IP Care management following.                   Expected Discharge Plan and Services                                               Social Drivers of Health (SDOH) Interventions SDOH Screenings   Food Insecurity: No Food Insecurity (10/13/2024)  Housing: Low Risk  (10/13/2024)  Transportation Needs: No Transportation Needs (10/13/2024)  Utilities: Not At Risk (10/13/2024)  Alcohol Screen: Low Risk  (08/24/2023)  Depression (PHQ2-9): Low Risk  (08/24/2023)  Financial Resource Strain: Low Risk  (08/24/2023)  Physical Activity: Inactive (08/24/2023)  Social Connections: Socially Isolated (10/13/2024)  Stress: No Stress Concern Present (08/24/2023)  Tobacco Use: Medium Risk (10/13/2024)  Health Literacy: Adequate Health Literacy (08/24/2023)    Readmission Risk Interventions     No data to display

## 2024-10-17 NOTE — Progress Notes (Signed)
 Physical Therapy Treatment Patient Details Name: Haley Randall MRN: [id number] DOB: [birth year] Today's Date: 10/17/2024   History of Present Illness 88 y.o. female adm 10/13/24 with medical history significant of hypertension, hyperlipidemia, and gout presents with abdominal cramps.  Admitted for abdominal cramps.    PT Comments  Pt with fair tolerance to treatment today. Pt today was able to ambulate in hallway and navigate stairs with CGA however was noticeably more fatigued today compared to previous session requiring several rest breaks. HR up 156 during ambulation. DC recs updated to HHPT as pt would benefit from increased mobility during acute stay. PT will continue to follow.      If plan is discharge home, recommend the following: A little help with walking and/or transfers;A little help with bathing/dressing/bathroom;Assistance with cooking/housework;Help with stairs or ramp for entrance;Assist for transportation   Can travel by private vehicle        Equipment Recommendations  None recommended by PT    Recommendations for Other Services       Precautions / Restrictions Precautions Precautions: Fall Recall of Precautions/Restrictions: Impaired Restrictions Weight Bearing Restrictions Per Provider Order: No     Mobility  Bed Mobility               General bed mobility comments: Up in recliner    Transfers Overall transfer level: Needs assistance Equipment used: None Transfers: Sit to/from Stand Sit to Stand: Supervision                Ambulation/Gait Ambulation/Gait assistance: Supervision, Contact guard assist Gait Distance (Feet): 150 Feet Assistive device: None Gait Pattern/deviations: Narrow base of support, Scissoring, Decreased stride length, Step-through pattern Gait velocity: decreased     General Gait Details: Pt noticably more fatigued today compared to previous session. 1 seated rest breaks. Pt reaching for external support in hallway.  HR up to 156 during ambulation.   Stairs Stairs: Yes Stairs assistance: Contact guard assist Stair Management: Alternating pattern, Sideways, One rail Left, One rail Right, Forwards Number of Stairs: 5 General stair comments: no LOB noted. Very cautious with pt navigating stairs sideways.   Wheelchair Mobility     Tilt Bed    Modified Rankin (Stroke Patients Only)       Balance Overall balance assessment: Needs assistance Sitting-balance support: Feet supported Sitting balance-Leahy Scale: Good     Standing balance support: Single extremity supported, No upper extremity supported Standing balance-Leahy Scale: Fair                              Hotel Manager: No apparent difficulties  Cognition Arousal: Alert Behavior During Therapy: WFL for tasks assessed/performed   PT - Cognitive impairments: No apparent impairments                         Following commands: Intact      Cueing Cueing Techniques: Verbal cues  Exercises      General Comments General comments (skin integrity, edema, etc.): HR up to 156 during ambulation      Pertinent Vitals/Pain Pain Assessment Pain Assessment: No/denies pain    Home Living                          Prior Function            PT Goals (current goals can now be found in the care plan section)  Progress towards PT goals: Progressing toward goals    Frequency    Min 2X/week      PT Plan      Co-evaluation              AM-PAC PT 6 Clicks Mobility   Outcome Measure  Help needed turning from your back to your side while in a flat bed without using bedrails?: A Little Help needed moving from lying on your back to sitting on the side of a flat bed without using bedrails?: A Little Help needed moving to and from a bed to a chair (including a wheelchair)?: A Little Help needed standing up from a chair using your arms (e.g., wheelchair or bedside  chair)?: A Little Help needed to walk in hospital room?: A Little Help needed climbing 3-5 steps with a railing? : A Little 6 Click Score: 18    End of Session Equipment Utilized During Treatment: Gait belt Activity Tolerance: Patient tolerated treatment well Patient left: in chair;with call bell/phone within reach;with chair alarm set Nurse Communication: Mobility status PT Visit Diagnosis: Other abnormalities of gait and mobility (R26.89)     Time: 1441-1457 PT Time Calculation (min) (ACUTE ONLY): 16 min  Charges:    $Gait Training: 8-22 mins PT General Charges $$ ACUTE PT VISIT: 1 Visit                     Sueellen NOVAK, PT, DPT Acute Rehab Services [phone]    Haley Randall 10/17/2024, 4:18 PM

## 2024-10-17 NOTE — Procedures (Signed)
 Pre procedural Dx: liver mass  Post procedural Dx: Same  Technically successful CT guided biopsy of right lobe liver mass   EBL: None.   Complications: None immediate.   KANDICE Banner, MD Pager #: [phone]

## 2024-10-17 NOTE — Care Management Important Message (Signed)
 Important Message  Patient Details  Name: Haley Randall MRN: [id number] Date of Birth: 1936-01-21   Important Message Given:  Yes - Medicare IM     Claretta Deed 10/17/2024, 1:52 PM

## 2024-10-17 NOTE — Progress Notes (Signed)
 PT Cancellation Note  Patient Details Name: Haley Randall MRN: [id number] DOB: November 15, 1936   Cancelled Treatment:    Reason Eval/Treat Not Completed: Active bedrest order (Pt on bedrest for one hour followed biopsy. Will follow up later.)   [name] 10/17/2024, 10:07 AM

## 2024-10-17 NOTE — Progress Notes (Signed)
 PT Cancellation Note  Patient Details Name: Haley Randall MRN: [id number] DOB: 1936/11/23   Cancelled Treatment:    Reason Eval/Treat Not Completed: Patient at procedure or test/unavailable (Pt off the floor at CT. Will follow up if time allows.)   Joesphine Schemm 10/17/2024, 9:04 AM

## 2024-10-17 NOTE — Progress Notes (Addendum)
 PROGRESS NOTE        PATIENT DETAILS Name: Haley Randall Age: 88 y.o. Sex: female Date of Birth: [birth year] Admit Date: 10/13/2024 Admitting Physician [name], MD ERE:Ilhjo, Ginger, FNP  Brief Summary: Patient is a 88 y.o.  female with history of HTN, HLD-who presented with abdominal pain-found to have pancreatitis/vague liver lesions on CT abdomen.  Significant events: 11/13>> admit to [facility]  Significant studies: 11/13>> CXR: No PNA 11/13>> CT abdomen/pelvis: 4.8 x 3.1 cm subtle lesion-right liver-additional small lesions measuring up to 2.1 cm.  Gallbladder distended.  Edema/inflammation along the descending ascending colon/hepatic flexure. 11/13>> RUQ ultrasound: Gallbladder wall upper normal to borderline thickened, no echogenic gallstones.  CBD 8 mm. 11/14>> MRI abdomen: Multicystic enhancing lesions in the liver-abscesses versus metastatic disease 10/17/2024.  CT-guided liver biopsy by IR scheduled.  Significant microbiology data: 11/13>> COVID/influenza/RSV PCR: Negative 11/13>> urine culture: Multiple species 11/13>> 1/2: Gram-positive cocci (prelim Staph epidermidis)-likely contamination. 11/14>> acute hepatitis serology: Negative 11/14>> RPR: Nonreactive     Consults: General surgery IR  Subjective:  Patient in bed, appears comfortable, denies any headache, no fever, no chest pain or pressure, no shortness of breath , no abdominal pain. No new focal weakness.    Objective: Vitals: Blood pressure 138/88, pulse 86, temperature 98.6 F (37 C), temperature source Oral, resp. rate 15, height 4' 11 (1.499 m), weight 52.3 kg, SpO2 98%.   Exam:  Awake Alert, No new F.N deficits, Normal affect [place].AT,PERRAL Supple Neck, No JVD,   Symmetrical Chest wall movement, Good air movement bilaterally, CTAB RRR,No Gallops, Rubs or new Murmurs,  +ve B.Sounds, Abd Soft, minimal upper quadrant tenderness, some suprapubic fullness. No  Cyanosis, Clubbing or edema     Assessment/Plan:  Acute pancreatitis No gallstones seen on imaging studies-no history of EtOH use-?  Related to malignancy (liver lesion seen on imaging studies).  MRCP negative for choledocholithiasis-no major pancreatic abnormality on MRI. Clinically slowly improving Continue IVF Tolerating full liquid diet  Acute metabolic encephalopathy Unclear etiology-?  Colitis/fentanyl that she received in the emergency room-superimposed on some cognitive issues at baseline (daughter acknowledges mild cognitive issues at baseline) Currently much more awake alert-no signs of confusion this morning Maintain delirium precautions  Transaminitis Potentially due to mass effect from liver lesions-alternatively could could have passed a small stone Acute hepatitis serology negative Transaminases downtrending MRCP as above Trend LFTs Continue supportive care  Multicystic enhancing liver lesions on MRCP abdomen  Unclear whether this is abscess (does have colitis on CT imaging) versus metastatic disease Continue Zosyn Discussed with IR-PA-C-Charles Karim 11/15-biopsy planned for 11/17-Will need pathology and also Gram stain/culture sensitivity, appropriate orders also placed, checking INR and type screen as well.  Will hold Lovenox on the 10/17/2024  ? Colitis No diarrhea Given liver masses-this could be a real issue that has caused liver abscesses. Continue Zosyn See above regarding plan liver biopsy 11/17.  Suprapubic fullness on 10/17/2024.  Check bladder scan, question incomplete emptying.    HTN BP slowly creeping up Start amlodipine  and see how she does  Hypomagnesemia Repleted  Hypokalemia Replete/recheck  Gram-positive bacteremia-coag negative staph (preliminary result)  likely a contaminant Await final results-doubt needs to be treated at this point.  ?  Mild dementia/cognitive issues B12/TSH/RPR all stable Ammonia level is only minimally  elevated-doubt of any clinical significance. No confusion this morning-continue to maintain delirium precautions  Palliative care Long discussion with patient/daughter at bedside-regarding MRI abdomen findings-abscess versus metastatic disease.  They seem okay with proceeding with a liver biopsy to differentiate between the two, however if it turns out that this is in fact malignancy-they may not want any further workup/treatment.  I have asked them to continue discussion amongst themselves, our service will follow tomorrow-and continue to clarify on goals of care.  Code status:   Code Status: Full Code   DVT Prophylaxis: enoxaparin (LOVENOX) injection 30 mg Start: 10/17/24 2200 Place and maintain sequential compression device Start: 10/16/24 0843   Family Communication: Daughter [name] [phone]  called and updated over the phone on 10/17/2024  Disposition Plan: Status is: Observation The patient will require care spanning > 2 midnights and should be moved to inpatient because: Severity of illness   Planned Discharge Destination:Home   Diet: Diet Order             Diet NPO time specified Except for: Sips with Meds  Diet effective midnight                    MEDICATIONS: Scheduled Meds:  amLODipine   5 mg Oral Daily   enoxaparin (LOVENOX) injection  30 mg Subcutaneous Daily   feeding supplement  1 Container Oral TID BM   lidocaine  1 %  10 mL Intradermal Once   pantoprazole (PROTONIX) IV  40 mg Intravenous Q12H   potassium chloride  40 mEq Oral BID   Continuous Infusions:  sodium chloride 75 mL/hr at 10/16/24 2231   piperacillin-tazobactam (ZOSYN)  IV 3.375 g (10/17/24 0526)   PRN Meds:.albuterol, fentaNYL (SUBLIMAZE) injection, hydrALAZINE, hydrALAZINE, ibuprofen , ondansetron **OR** ondansetron (ZOFRAN) IV, oxyCODONE   I have personally reviewed following labs and imaging studies  LABORATORY DATA:   Data Review:   Inpatient Medications  Scheduled Meds:   amLODipine   5 mg Oral Daily   enoxaparin (LOVENOX) injection  30 mg Subcutaneous Daily   feeding supplement  1 Container Oral TID BM   lidocaine  1 %  10 mL Intradermal Once   pantoprazole (PROTONIX) IV  40 mg Intravenous Q12H   potassium chloride  40 mEq Oral BID   Continuous Infusions:  sodium chloride 75 mL/hr at 10/16/24 2231   piperacillin-tazobactam (ZOSYN)  IV 3.375 g (10/17/24 0526)   PRN Meds:.albuterol, fentaNYL (SUBLIMAZE) injection, hydrALAZINE, hydrALAZINE, ibuprofen , ondansetron **OR** ondansetron (ZOFRAN) IV, oxyCODONE  DVT Prophylaxis  enoxaparin (LOVENOX) injection 30 mg Start: 10/17/24 2200 Place and maintain sequential compression device Start: 10/16/24 0843   Recent Labs  Lab 10/13/24 0517 10/13/24 0518 10/14/24 0237 10/15/24 0344 10/16/24 0627 10/17/24 0655  WBC 17.4*  --  14.1* 11.4* 8.5 8.0  HGB 10.8* 11.6* 10.0* 9.1* 8.6* 8.2*  HCT 34.6* 34.0* 31.8* 28.7* 27.3* 25.2*  PLT 209  --  131* 121* 125* 153  MCV 82.8  --  81.5 80.8 82.5 80.3  MCH 25.8*  --  25.6* 25.6* 26.0 26.1  MCHC 31.2  --  31.4 31.7 31.5 32.5  RDW 15.3  --  15.9* 16.0* 16.4* 16.3*  LYMPHSABS 1.6  --   --   --  0.4* 0.4*  MONOABS 0.7  --   --   --  0.2 0.6  EOSABS 0.0  --   --   --  0.0 0.1  BASOSABS 0.0  --   --   --  0.0 0.0    Recent Labs  Lab 10/13/24 0517 10/13/24 0518 10/13/24 0640 10/13/24 1756 10/14/24 0237 10/14/24 1614  10/15/24 0344 10/16/24 0627 10/17/24 0404 10/17/24 0655  NA 140 141  --   --  140  --  137 137  --  137  K 3.9 3.9  --   --  4.0  --  3.2* 3.8  --  3.3*  CL 108 110  --   --  111  --  110 108  --  108  CO2 19*  --   --   --  17*  --  18* 17*  --  19*  ANIONGAP 13  --   --   --  12  --  9 12  --  10  GLUCOSE 201* 203*  --   --  214*  --  117* 92  --  108*  BUN 18 18  --   --  21  --  19 17  --  16  CREATININE 1.44* 1.40*  --   --  1.79*  --  1.57* 1.45*  --  1.26*  AST 293*  --   --   --  372*  --  135* 61*  --  40  ALT 102*  --   --   --  225*   --  134* 83*  --  58*  ALKPHOS 88  --   --   --  62  --  50 57  --  61  BILITOT 1.7*  --   --   --  2.0*  --  2.4* 2.4*  --  1.7*  ALBUMIN 3.5  --   --   --  3.0*  --  2.4* 2.2*  --  2.0*  LATICACIDVEN  --  2.7* 1.8  --   --   --   --   --   --   --   INR 1.1  --   --   --   --   --   --   --  1.1  --   TSH  --   --   --   --   --  1.241  --   --   --   --   AMMONIA  --   --   --  37*  --   --   --   --   --   --   MG  --   --   --   --  1.6*  --  2.7* 2.2  --  2.0  CALCIUM  9.4  --   --   --  8.8*  --  8.2* 8.6*  --  8.5*      Recent Labs  Lab 10/13/24 0517 10/13/24 0518 10/13/24 0640 10/13/24 1756 10/14/24 0237 10/14/24 1614 10/15/24 0344 10/16/24 0627 10/17/24 0404 10/17/24 0655  LATICACIDVEN  --  2.7* 1.8  --   --   --   --   --   --   --   INR 1.1  --   --   --   --   --   --   --  1.1  --   TSH  --   --   --   --   --  1.241  --   --   --   --   AMMONIA  --   --   --  37*  --   --   --   --   --   --   MG  --   --   --   --  1.6*  --  2.7* 2.2  --  2.0  CALCIUM  9.4  --   --   --  8.8*  --  8.2* 8.6*  --  8.5*    --------------------------------------------------------------------------------------------------------------- Lab Results  Component Value Date   CHOL 157 09/01/2023   HDL 86 09/01/2023   LDLCALC 57 09/01/2023   TRIG 74 09/01/2023   CHOLHDL 1.8 09/01/2023    Lab Results  Component Value Date   HGBA1C 6.2 10/02/2022   Recent Labs    10/14/24 1614  TSH 1.241   Recent Labs    10/14/24 1614  VITAMINB12 481   ------------------------------------------------------------------------------------------------------------------ Cardiac Enzymes No results for input(s): CKMB, TROPONINI, MYOGLOBIN in the last 168 hours.  Invalid input(s): CK  Micro Results Recent Results (from the past 240 hours)  Blood Culture (routine x 2)     Status: None (Preliminary result)   Collection Time: 10/13/24  5:17 AM   Specimen: BLOOD  Result Value Ref Range  Status   Specimen Description BLOOD BLOOD LEFT ARM  Final   Special Requests   Final    BOTTLES DRAWN AEROBIC AND ANAEROBIC Blood Culture adequate volume   Culture   Final    NO GROWTH 3 DAYS Performed at [facility], [address], [place], [place] [address]    Report Status PENDING  Incomplete  Blood Culture (routine x 2)     Status: Abnormal   Collection Time: 10/13/24  5:17 AM   Specimen: BLOOD  Result Value Ref Range Status   Specimen Description BLOOD BLOOD LEFT HAND  Final   Special Requests   Final    BOTTLES DRAWN AEROBIC ONLY Blood Culture results may not be optimal due to an inadequate volume of blood received in culture bottles   Culture  Setup Time   Final    GRAM POSITIVE COCCI IN CLUSTERS AEROBIC BOTTLE ONLY CRITICAL RESULT CALLED TO, READ BACK BY AND VERIFIED WITH: MAYA Ditch on [id number] @1010  by SM    Culture (A)  Final    STAPHYLOCOCCUS EPIDERMIDIS THE SIGNIFICANCE OF ISOLATING THIS ORGANISM FROM A SINGLE SET OF BLOOD CULTURES WHEN MULTIPLE SETS ARE DRAWN IS UNCERTAIN. PLEASE NOTIFY THE MICROBIOLOGY DEPARTMENT WITHIN ONE WEEK IF SPECIATION AND SENSITIVITIES ARE REQUIRED. Performed at [facility], [address], [place], [place] [address]    Report Status 10/15/2024 FINAL  Final  Blood Culture ID Panel (Reflexed)     Status: Abnormal   Collection Time: 10/13/24  5:17 AM  Result Value Ref Range Status   Enterococcus faecalis NOT DETECTED NOT DETECTED Final   Enterococcus Faecium NOT DETECTED NOT DETECTED Final   Listeria monocytogenes NOT DETECTED NOT DETECTED Final   Staphylococcus species DETECTED (A) NOT DETECTED Final    Comment: CRITICAL RESULT CALLED TO, READ BACK BY AND VERIFIED WITH: PHARMD Jeremy on [phone] @1010  by SM    Staphylococcus aureus (BCID) NOT DETECTED NOT DETECTED Final   Staphylococcus epidermidis DETECTED (A) NOT DETECTED Final    Comment: CRITICAL RESULT CALLED TO, READ BACK BY AND VERIFIED WITH: PHARMD Jeremy on [phone]  @1010  by SM    Staphylococcus lugdunensis NOT DETECTED NOT DETECTED Final   Streptococcus species NOT DETECTED NOT DETECTED Final   Streptococcus agalactiae NOT DETECTED NOT DETECTED Final   Streptococcus pneumoniae NOT DETECTED NOT DETECTED Final   Streptococcus pyogenes NOT DETECTED NOT DETECTED Final   A.calcoaceticus-baumannii NOT DETECTED NOT DETECTED Final   Bacteroides fragilis NOT DETECTED NOT DETECTED Final   Enterobacterales  NOT DETECTED NOT DETECTED Final   Enterobacter cloacae complex NOT DETECTED NOT DETECTED Final   Escherichia coli NOT DETECTED NOT DETECTED Final   Klebsiella aerogenes NOT DETECTED NOT DETECTED Final   Klebsiella oxytoca NOT DETECTED NOT DETECTED Final   Klebsiella pneumoniae NOT DETECTED NOT DETECTED Final   Proteus species NOT DETECTED NOT DETECTED Final   Salmonella species NOT DETECTED NOT DETECTED Final   Serratia marcescens NOT DETECTED NOT DETECTED Final   Haemophilus influenzae NOT DETECTED NOT DETECTED Final   Neisseria meningitidis NOT DETECTED NOT DETECTED Final   Pseudomonas aeruginosa NOT DETECTED NOT DETECTED Final   Stenotrophomonas maltophilia NOT DETECTED NOT DETECTED Final   Candida albicans NOT DETECTED NOT DETECTED Final   Candida auris NOT DETECTED NOT DETECTED Final   Candida glabrata NOT DETECTED NOT DETECTED Final   Candida krusei NOT DETECTED NOT DETECTED Final   Candida parapsilosis NOT DETECTED NOT DETECTED Final   Candida tropicalis NOT DETECTED NOT DETECTED Final   Cryptococcus neoformans/gattii NOT DETECTED NOT DETECTED Final   Methicillin resistance mecA/C NOT DETECTED NOT DETECTED Final    Comment: Performed at [facility], [address] [place], [place] [address]  Resp panel by RT-PCR (RSV, Flu A&B, Covid) Anterior Nasal Swab     Status: None   Collection Time: 10/13/24  7:14 AM   Specimen: Anterior Nasal Swab  Result Value Ref Range Status   SARS Coronavirus 2 by RT PCR NEGATIVE NEGATIVE Final   Influenza A  by PCR NEGATIVE NEGATIVE Final   Influenza B by PCR NEGATIVE NEGATIVE Final    Comment: (NOTE) The Xpert Xpress SARS-CoV-2/FLU/RSV plus assay is intended as an aid in the diagnosis of influenza from Nasopharyngeal swab specimens and should not be used as a sole basis for treatment. Nasal washings and aspirates are unacceptable for Xpert Xpress SARS-CoV-2/FLU/RSV testing.  Fact Sheet for Patients: bloggercourse.com  Fact Sheet for Healthcare Providers: seriousbroker.it  This test is not yet approved or cleared by the [place]  FDA and has been authorized for detection and/or diagnosis of SARS-CoV-2 by FDA under an Emergency Use Authorization (EUA). This EUA will remain in effect (meaning this test can be used) for the duration of the COVID-19 declaration under Section 564(b)(1) of the Act, 21 U.S.C. section 360bbb-3(b)(1), unless the authorization is terminated or revoked.     Resp Syncytial Virus by PCR NEGATIVE NEGATIVE Final    Comment: (NOTE) Fact Sheet for Patients: bloggercourse.com  Fact Sheet for Healthcare Providers: seriousbroker.it  This test is not yet approved or cleared by the [place]  FDA and has been authorized for detection and/or diagnosis of SARS-CoV-2 by FDA under an Emergency Use Authorization (EUA). This EUA will remain in effect (meaning this test can be used) for the duration of the COVID-19 declaration under Section 564(b)(1) of the Act, 21 U.S.C. section 360bbb-3(b)(1), unless the authorization is terminated or revoked.  Performed at [facility], [address], [place], [place] [address]   Urine Culture (for pregnant, neutropenic or urologic patients or patients with an indwelling urinary catheter)     Status: Abnormal   Collection Time: 10/13/24  9:25 AM   Specimen: Urine, Clean Catch  Result Value Ref Range Status   Specimen  Description URINE, CLEAN CATCH  Final   Special Requests   Final    NONE Performed at [facility], [address] [place], [place] [address]    Culture MULTIPLE SPECIES PRESENT, SUGGEST RECOLLECTION (A)  Final  Report Status 10/14/2024 FINAL  Final    Radiology Reports  No results found.     Signature  -   Lavada Stank M.D on 10/17/2024 at 8:43 AM   -  To page go to [email or web address]

## 2024-10-18 DIAGNOSIS — R109 Unspecified abdominal pain: Secondary | ICD-10-CM | POA: Diagnosis not present

## 2024-10-18 LAB — CBC WITH DIFFERENTIAL/PLATELET
Abs Immature Granulocytes: 0.18 K/uL — ABNORMAL HIGH (ref 0.00–0.07)
Basophils Absolute: 0 K/uL (ref 0.0–0.1)
Basophils Relative: 0 %
Eosinophils Absolute: 0.1 K/uL (ref 0.0–0.5)
Eosinophils Relative: 1 %
HCT: 25.3 % — ABNORMAL LOW (ref 36.0–46.0)
Hemoglobin: 8.1 g/dL — ABNORMAL LOW (ref 12.0–15.0)
Immature Granulocytes: 2 %
Lymphocytes Relative: 7 %
Lymphs Abs: 0.7 K/uL (ref 0.7–4.0)
MCH: 25.6 pg — ABNORMAL LOW (ref 26.0–34.0)
MCHC: 32 g/dL (ref 30.0–36.0)
MCV: 80.1 fL (ref 80.0–100.0)
Monocytes Absolute: 0.7 K/uL (ref 0.1–1.0)
Monocytes Relative: 8 %
Neutro Abs: 7.8 K/uL — ABNORMAL HIGH (ref 1.7–7.7)
Neutrophils Relative %: 82 %
Platelets: 176 K/uL (ref 150–400)
RBC: 3.16 MIL/uL — ABNORMAL LOW (ref 3.87–5.11)
RDW: 16.3 % — ABNORMAL HIGH (ref 11.5–15.5)
WBC: 9.5 K/uL (ref 4.0–10.5)
nRBC: 0.4 % — ABNORMAL HIGH (ref 0.0–0.2)

## 2024-10-18 LAB — COMPREHENSIVE METABOLIC PANEL WITH GFR
ALT: 60 U/L — ABNORMAL HIGH (ref 0–44)
AST: 49 U/L — ABNORMAL HIGH (ref 15–41)
Albumin: 2.4 g/dL — ABNORMAL LOW (ref 3.5–5.0)
Alkaline Phosphatase: 67 U/L (ref 38–126)
Anion gap: 11 (ref 5–15)
BUN: 13 mg/dL (ref 8–23)
CO2: 16 mmol/L — ABNORMAL LOW (ref 22–32)
Calcium: 9.2 mg/dL (ref 8.9–10.3)
Chloride: 109 mmol/L (ref 98–111)
Creatinine, Ser: 1.43 mg/dL — ABNORMAL HIGH (ref 0.44–1.00)
GFR, Estimated: 36 mL/min — ABNORMAL LOW (ref >60)
Glucose, Bld: 109 mg/dL — ABNORMAL HIGH (ref 70–99)
Potassium: 4.2 mmol/L (ref 3.5–5.1)
Sodium: 136 mmol/L (ref 135–145)
Total Bilirubin: 1.6 mg/dL — ABNORMAL HIGH (ref 0.0–1.2)
Total Protein: 5.9 g/dL — ABNORMAL LOW (ref 6.5–8.1)

## 2024-10-18 LAB — CULTURE, BLOOD (ROUTINE X 2)
Culture: NO GROWTH
Special Requests: ADEQUATE

## 2024-10-18 LAB — T4, FREE: Free T4: 1.04 ng/dL (ref 0.61–1.12)

## 2024-10-18 LAB — IRON AND TIBC
Iron: 33 ug/dL (ref 28–170)
Saturation Ratios: 11 % (ref 10.4–31.8)
TIBC: 288 ug/dL (ref 250–450)
UIBC: 255 ug/dL

## 2024-10-18 LAB — VITAMIN B12: Vitamin B-12: 826 pg/mL (ref 180–914)

## 2024-10-18 LAB — PHOSPHORUS: Phosphorus: 1.3 mg/dL — ABNORMAL LOW (ref 2.5–4.6)

## 2024-10-18 LAB — MAGNESIUM: Magnesium: 1.9 mg/dL (ref 1.7–2.4)

## 2024-10-18 LAB — TSH: TSH: 5.88 u[IU]/mL — ABNORMAL HIGH (ref 0.350–4.500)

## 2024-10-18 LAB — FERRITIN: Ferritin: 190 ng/mL (ref 11–307)

## 2024-10-18 MED ORDER — GLUCERNA SHAKE PO LIQD
237.0000 mL | Freq: Three times a day (TID) | ORAL | Status: DC
  Administered 2024-10-18 (×3): 237 mL via ORAL

## 2024-10-18 MED ORDER — FOLIC ACID 1 MG PO TABS
1.0000 mg | ORAL_TABLET | Freq: Every day | ORAL | Status: DC
  Administered 2024-10-18 – 2024-10-31 (×14): 1 mg via ORAL
  Filled 2024-10-18 (×14): qty 1

## 2024-10-18 MED ORDER — SODIUM PHOSPHATES 45 MMOLE/15ML IV SOLN
30.0000 mmol | Freq: Once | INTRAVENOUS | Status: AC
  Administered 2024-10-18: 30 mmol via INTRAVENOUS
  Filled 2024-10-18: qty 10

## 2024-10-18 MED ORDER — FERROUS SULFATE 325 (65 FE) MG PO TABS
325.0000 mg | ORAL_TABLET | Freq: Every day | ORAL | Status: DC
  Administered 2024-10-19 – 2024-10-31 (×13): 325 mg via ORAL
  Filled 2024-10-18 (×13): qty 1

## 2024-10-18 NOTE — Progress Notes (Addendum)
   10/18/24 1000  Mobility  Activity Ambulated with assistance  Level of Assistance Contact guard assist, steadying assist  Assistive Device Other (Comment) (Handrails)  Distance Ambulated (ft) 150 ft  Activity Response Tolerated fair  Mobility Referral Yes  Mobility visit 1 Mobility  Mobility Specialist Start Time (ACUTE ONLY) 0947  Mobility Specialist Stop Time (ACUTE ONLY) 1000  Mobility Specialist Time Calculation (min) (ACUTE ONLY) 13 min   Mobility Specialist: Progress Note  Pre-Mobility:      HR 67, SpO2 94% RA Post-Mobility:    HR 74, SpO2 97% RA  Pt agreeable to mobility session - received in chair. C/o BLE weakness. Returned to chair with all needs met - call bell within reach. Chair alarm on.    Additional comments:Pt drifting L to R. Pt seen for additional visit as this MS responded to chair alarm, pt requested to use BR. Pericare performed ind, left in chair with chair alarm on, call bell within reach.   Virgle Boards, BS Mobility Specialist Please contact via SecureChat or  Rehab office at [phone].

## 2024-10-18 NOTE — Plan of Care (Signed)

## 2024-10-18 NOTE — Progress Notes (Addendum)
 PROGRESS NOTE        PATIENT DETAILS Name: Haley Randall Age: 88 y.o. Sex: female Date of Birth: 1935/12/03 Admit Date: 10/13/2024 Admitting Physician [name], MD ERE:Ilhjo, Ginger, FNP  Brief Summary: Patient is a 88 y.o.  female with history of HTN, HLD-who presented with abdominal pain-found to have pancreatitis/vague liver lesions on CT abdomen.  Significant events: 11/13>> admit to [facility]  Significant studies: 11/13>> CXR: No PNA 11/13>> CT abdomen/pelvis: 4.8 x 3.1 cm subtle lesion-right liver-additional small lesions measuring up to 2.1 cm.  Gallbladder distended.  Edema/inflammation along the descending ascending colon/hepatic flexure. 11/13>> RUQ ultrasound: Gallbladder wall upper normal to borderline thickened, no echogenic gallstones.  CBD 8 mm. 11/14>> MRI abdomen: Multicystic enhancing lesions in the liver-abscesses versus metastatic disease 10/17/2024.  CT-guided liver biopsy by IR scheduled.  Significant microbiology data: 11/13>> COVID/influenza/RSV PCR: Negative 11/13>> urine culture: Multiple species 11/13>> 1/2: Gram-positive cocci (prelim Staph epidermidis)-likely contamination. 11/14>> acute hepatitis serology: Negative 11/14>> RPR: Nonreactive     Consults: General surgery IR  Subjective:  Patient laying in bed.  No new or worsening concerns.  Abdominal pain is resolved.  She did urinate a lot last night after receiving Lasix and reports no dysuria, no shortness of breath.  Objective: Vitals: Blood pressure (!) 156/49, pulse 78, temperature 97.9 F (36.6 C), temperature source Axillary, resp. rate 20, height 4' 11 (1.499 m), weight 52.3 kg, SpO2 98%.   Exam:  Awake Alert, No new F.N deficits, Normal affect [place].AT,PERRAL Supple Neck, No JVD,   Symmetrical Chest wall movement, Good air movement bilaterally, CTAB RRR,No Gallops, Rubs or new Murmurs,  +ve B.Sounds, Abd Soft, nontender No Cyanosis, Clubbing or edema      Assessment/Plan:  Acute pancreatitis No gallstones seen on imaging studies-no history of EtOH use- ?Related to malignancy (liver lesion seen on imaging studies).  MRCP negative for choledocholithiasis-no major pancreatic abnormality on MRI. Clinically slowly improving, abdominal pain resolved Stopped IV fluids with signs of volume overload, added Lasix last night with benefit Tolerating full liquid diet, advance as tolerated  Acute metabolic encephalopathy Unclear etiology-?  Colitis/fentanyl that she received in the emergency room-superimposed on some cognitive issues at baseline (daughter acknowledges mild cognitive issues at baseline) Resolved after admission and stable this morning without any signs of confusion Maintain delirium precautions  Transaminitis Hyperbilirubinemia Potentially due to mass effect from liver lesions-alternatively could could have passed a small stone Acute hepatitis serology negative Transaminases and bilirubin down from admission and stable just above upper limits of normal MRCP as above S/p liver biopsy 11/17 Trend LFTs Continue supportive care  Multicystic enhancing liver lesions on MRCP abdomen  Unclear whether this is abscess (does have colitis on CT imaging) versus metastatic disease Continue Zosyn S/p liver mass biopsy 11/17, pathology pending, will ensure Gram stain/culture is added No signs of bleeding after procedure, hemoglobin stable at 8.1  NAGMA Bicarb 16 this morning which is slightly down from admission Mild AKI on admission and had been getting bicarb free fluids which were stopped last night Continue to monitor   ? Colitis No diarrhea Given liver masses-this could be a real issue that has caused liver abscesses. Continue Zosyn See above regarding liver biopsy  HTN BP stable this morning but tachycardic yesterday evening so we stopped amlodipine  in favor of metoprolol but think this was due to volume overload  from IV  fluids Plan to add back amlodipine  if tachycardia does not return and still hypertensive  Normocytic anemia No recent baseline but hemoglobin had been normal over 1 year ago Slight drop since admission from 11-8 with significant hemodilution S/p liver biopsy 11/17 without signs of new bleeding Iron labs last checked in 2023 Add on iron panel If below 8 tomorrow we will plan on blood transfusion of 1 unit PRBCs, informed consent obtained on 10/18/2024  Hypomagnesemia Repleted  Hypokalemia Repleted/recheck  Hypophosphatemia Replete with sodium phosphate, recheck tomorrow  Gram-positive bacteremia-coag negative staph (preliminary result)  likely a contaminant Await final results-doubt needs to be treated at this point.  ?  Mild dementia/cognitive issues B12/RPR all stable Ammonia level is only minimally elevated-doubt of any clinical significance. No confusion this morning-continue to maintain delirium precautions  Elevated TSH TSH 5.8 with T4 1.04.  Likely sick euthyroid and will need repeat TSH in 4 to 6 weeks  Palliative care Previously had long discussion with the patient/daughter regarding findings on MRI that could represent metastatic disease.  Underwent liver biopsy 11/17 and awaiting results but if metastatic disease they seem to be leaning towards stopping further workup but will continue to discuss  Code status:   Code Status: Full Code   DVT Prophylaxis: enoxaparin (LOVENOX) injection 30 mg Start: 10/18/24 1000 Place and maintain sequential compression device Start: 10/16/24 0843   Family Communication: Daughter [name] [phone]  called and updated over the phone on 10/17/2024  Disposition Plan: Status is: Observation The patient will require care spanning > 2 midnights and should be moved to inpatient because: Severity of illness   Planned Discharge Destination:Home   Diet: Diet Order             Diet full liquid Room service appropriate? Yes; Fluid  consistency: Thin  Diet effective now                    MEDICATIONS: Scheduled Meds:  enoxaparin (LOVENOX) injection  30 mg Subcutaneous Daily   feeding supplement  1 Container Oral TID BM   metoprolol tartrate  50 mg Oral BID   pantoprazole (PROTONIX) IV  40 mg Intravenous Q12H   Continuous Infusions:  piperacillin-tazobactam (ZOSYN)  IV 3.375 g (10/18/24 0614)   sodium PHOSPHATE IVPB (in mmol) 30 mmol (10/18/24 0626)   PRN Meds:.albuterol, diltiazem, fentaNYL (SUBLIMAZE) injection, hydrALAZINE, ondansetron **OR** ondansetron (ZOFRAN) IV, oxyCODONE   I have personally reviewed following labs and imaging studies  LABORATORY DATA:   Data Review:   Inpatient Medications  Scheduled Meds:  enoxaparin (LOVENOX) injection  30 mg Subcutaneous Daily   feeding supplement  1 Container Oral TID BM   metoprolol tartrate  50 mg Oral BID   pantoprazole (PROTONIX) IV  40 mg Intravenous Q12H   Continuous Infusions:  piperacillin-tazobactam (ZOSYN)  IV 3.375 g (10/18/24 0614)   sodium PHOSPHATE IVPB (in mmol) 30 mmol (10/18/24 0626)   PRN Meds:.albuterol, diltiazem, fentaNYL (SUBLIMAZE) injection, hydrALAZINE, ondansetron **OR** ondansetron (ZOFRAN) IV, oxyCODONE  DVT Prophylaxis  enoxaparin (LOVENOX) injection 30 mg Start: 10/18/24 1000 Place and maintain sequential compression device Start: 10/16/24 0843   Recent Labs  Lab 10/13/24 0517 10/13/24 0518 10/14/24 0237 10/15/24 0344 10/16/24 0627 10/17/24 0655 10/18/24 0640  WBC 17.4*  --  14.1* 11.4* 8.5 8.0 9.5  HGB 10.8*   < > 10.0* 9.1* 8.6* 8.2* 8.1*  HCT 34.6*   < > 31.8* 28.7* 27.3* 25.2* 25.3*  PLT 209  --  131*  121* 125* 153 176  MCV 82.8  --  81.5 80.8 82.5 80.3 80.1  MCH 25.8*  --  25.6* 25.6* 26.0 26.1 25.6*  MCHC 31.2  --  31.4 31.7 31.5 32.5 32.0  RDW 15.3  --  15.9* 16.0* 16.4* 16.3* 16.3*  LYMPHSABS 1.6  --   --   --  0.4* 0.4* 0.7  MONOABS 0.7  --   --   --  0.2 0.6 0.7  EOSABS 0.0  --   --   --  0.0  0.1 0.1  BASOSABS 0.0  --   --   --  0.0 0.0 0.0   < > = values in this interval not displayed.    Recent Labs  Lab 10/13/24 0517 10/13/24 0518 10/13/24 0640 10/13/24 1756 10/14/24 0237 10/14/24 1614 10/15/24 0344 10/16/24 0627 10/17/24 0404 10/17/24 0655 10/18/24 0343  NA 140 141  --   --  140  --  137 137  --  137 136  K 3.9 3.9  --   --  4.0  --  3.2* 3.8  --  3.3* 4.2  CL 108 110  --   --  111  --  110 108  --  108 109  CO2 19*  --   --   --  17*  --  18* 17*  --  19* 16*  ANIONGAP 13  --   --   --  12  --  9 12  --  10 11  GLUCOSE 201* 203*  --   --  214*  --  117* 92  --  108* 109*  BUN 18 18  --   --  21  --  19 17  --  16 13  CREATININE 1.44* 1.40*  --   --  1.79*  --  1.57* 1.45*  --  1.26* 1.43*  AST 293*  --   --   --  372*  --  135* 61*  --  40 49*  ALT 102*  --   --   --  225*  --  134* 83*  --  58* 60*  ALKPHOS 88  --   --   --  62  --  50 57  --  61 67  BILITOT 1.7*  --   --   --  2.0*  --  2.4* 2.4*  --  1.7* 1.6*  ALBUMIN 3.5  --   --   --  3.0*  --  2.4* 2.2*  --  2.0* 2.4*  LATICACIDVEN  --  2.7* 1.8  --   --   --   --   --   --   --   --   INR 1.1  --   --   --   --   --   --   --  1.1  --   --   TSH  --   --   --   --   --  1.241  --   --   --   --  5.880*  AMMONIA  --   --   --  37*  --   --   --   --   --   --   --   MG  --   --   --   --  1.6*  --  2.7* 2.2  --  2.0 1.9  PHOS  --   --   --   --   --   --   --   --   --   --  1.3*  CALCIUM  9.4  --   --   --  8.8*  --  8.2* 8.6*  --  8.5* 9.2      Recent Labs  Lab 10/13/24 0517 10/13/24 0518 10/13/24 0640 10/13/24 1756 10/14/24 0237 10/14/24 1614 10/15/24 0344 10/16/24 0627 10/17/24 0404 10/17/24 0655 10/18/24 0343  LATICACIDVEN  --  2.7* 1.8  --   --   --   --   --   --   --   --   INR 1.1  --   --   --   --   --   --   --  1.1  --   --   TSH  --   --   --   --   --  1.241  --   --   --   --  5.880*  AMMONIA  --   --   --  37*  --   --   --   --   --   --   --   MG  --   --   --   --   1.6*  --  2.7* 2.2  --  2.0 1.9  CALCIUM  9.4  --   --   --  8.8*  --  8.2* 8.6*  --  8.5* 9.2    --------------------------------------------------------------------------------------------------------------- Lab Results  Component Value Date   CHOL 157 09/01/2023   HDL 86 09/01/2023   LDLCALC 57 09/01/2023   TRIG 74 09/01/2023   CHOLHDL 1.8 09/01/2023    Lab Results  Component Value Date   HGBA1C 6.2 10/02/2022   Recent Labs    10/18/24 0343  TSH 5.880*  FREET4 1.04   No results for input(s): VITAMINB12, FOLATE, FERRITIN, TIBC, IRON, RETICCTPCT in the last 72 hours.  ------------------------------------------------------------------------------------------------------------------ Cardiac Enzymes No results for input(s): CKMB, TROPONINI, MYOGLOBIN in the last 168 hours.  Invalid input(s): CK  Micro Results Recent Results (from the past 240 hours)  Blood Culture (routine x 2)     Status: None (Preliminary result)   Collection Time: 10/13/24  5:17 AM   Specimen: BLOOD  Result Value Ref Range Status   Specimen Description BLOOD BLOOD LEFT ARM  Final   Special Requests   Final    BOTTLES DRAWN AEROBIC AND ANAEROBIC Blood Culture adequate volume   Culture   Final    NO GROWTH 4 DAYS Performed at [facility], [address], [place], [place] [address]    Report Status PENDING  Incomplete  Blood Culture (routine x 2)     Status: Abnormal   Collection Time: 10/13/24  5:17 AM   Specimen: BLOOD  Result Value Ref Range Status   Specimen Description BLOOD BLOOD LEFT HAND  Final   Special Requests   Final    BOTTLES DRAWN AEROBIC ONLY Blood Culture results may not be optimal due to an inadequate volume of blood received in culture bottles   Culture  Setup Time   Final    GRAM POSITIVE COCCI IN CLUSTERS AEROBIC BOTTLE ONLY CRITICAL RESULT CALLED TO, READ BACK BY AND VERIFIED WITH: PHARMD Jeremy on [phone] @1010  by SM    Culture (A)  Final     STAPHYLOCOCCUS EPIDERMIDIS THE SIGNIFICANCE OF ISOLATING THIS ORGANISM FROM A SINGLE SET OF BLOOD CULTURES WHEN MULTIPLE SETS ARE DRAWN IS UNCERTAIN. PLEASE NOTIFY THE MICROBIOLOGY DEPARTMENT WITHIN ONE WEEK IF SPECIATION AND SENSITIVITIES ARE REQUIRED. Performed at  [facility], [address], [place], [place] [address]    Report Status 10/15/2024 FINAL  Final  Blood Culture ID Panel (Reflexed)     Status: Abnormal   Collection Time: 10/13/24  5:17 AM  Result Value Ref Range Status   Enterococcus faecalis NOT DETECTED NOT DETECTED Final   Enterococcus Faecium NOT DETECTED NOT DETECTED Final   Listeria monocytogenes NOT DETECTED NOT DETECTED Final   Staphylococcus species DETECTED (A) NOT DETECTED Final    Comment: CRITICAL RESULT CALLED TO, READ BACK BY AND VERIFIED WITH: PHARMD Jeremy on [phone] @1010  by SM    Staphylococcus aureus (BCID) NOT DETECTED NOT DETECTED Final   Staphylococcus epidermidis DETECTED (A) NOT DETECTED Final    Comment: CRITICAL RESULT CALLED TO, READ BACK BY AND VERIFIED WITH: PHARMD Jeremy on [phone] @1010  by SM    Staphylococcus lugdunensis NOT DETECTED NOT DETECTED Final   Streptococcus species NOT DETECTED NOT DETECTED Final   Streptococcus agalactiae NOT DETECTED NOT DETECTED Final   Streptococcus pneumoniae NOT DETECTED NOT DETECTED Final   Streptococcus pyogenes NOT DETECTED NOT DETECTED Final   A.calcoaceticus-baumannii NOT DETECTED NOT DETECTED Final   Bacteroides fragilis NOT DETECTED NOT DETECTED Final   Enterobacterales NOT DETECTED NOT DETECTED Final   Enterobacter cloacae complex NOT DETECTED NOT DETECTED Final   Escherichia coli NOT DETECTED NOT DETECTED Final   Klebsiella aerogenes NOT DETECTED NOT DETECTED Final   Klebsiella oxytoca NOT DETECTED NOT DETECTED Final   Klebsiella pneumoniae NOT DETECTED NOT DETECTED Final   Proteus species NOT DETECTED NOT DETECTED Final   Salmonella species NOT DETECTED NOT DETECTED Final   Serratia  marcescens NOT DETECTED NOT DETECTED Final   Haemophilus influenzae NOT DETECTED NOT DETECTED Final   Neisseria meningitidis NOT DETECTED NOT DETECTED Final   Pseudomonas aeruginosa NOT DETECTED NOT DETECTED Final   Stenotrophomonas maltophilia NOT DETECTED NOT DETECTED Final   Candida albicans NOT DETECTED NOT DETECTED Final   Candida auris NOT DETECTED NOT DETECTED Final   Candida glabrata NOT DETECTED NOT DETECTED Final   Candida krusei NOT DETECTED NOT DETECTED Final   Candida parapsilosis NOT DETECTED NOT DETECTED Final   Candida tropicalis NOT DETECTED NOT DETECTED Final   Cryptococcus neoformans/gattii NOT DETECTED NOT DETECTED Final   Methicillin resistance mecA/C NOT DETECTED NOT DETECTED Final    Comment: Performed at [facility], [address], [place], [place] [address]  Resp panel by RT-PCR (RSV, Flu A&B, Covid) Anterior Nasal Swab     Status: None   Collection Time: 10/13/24  7:14 AM   Specimen: Anterior Nasal Swab  Result Value Ref Range Status   SARS Coronavirus 2 by RT PCR NEGATIVE NEGATIVE Final   Influenza A by PCR NEGATIVE NEGATIVE Final   Influenza B by PCR NEGATIVE NEGATIVE Final    Comment: (NOTE) The Xpert Xpress SARS-CoV-2/FLU/RSV plus assay is intended as an aid in the diagnosis of influenza from Nasopharyngeal swab specimens and should not be used as a sole basis for treatment. Nasal washings and aspirates are unacceptable for Xpert Xpress SARS-CoV-2/FLU/RSV testing.  Fact Sheet for Patients: bloggercourse.com  Fact Sheet for Healthcare Providers: seriousbroker.it  This test is not yet approved or cleared by the [place]  FDA and has been authorized for detection and/or diagnosis of SARS-CoV-2 by FDA under an Emergency Use Authorization (EUA). This EUA will remain in effect (meaning this test can be used) for the duration of the COVID-19 declaration under Section 564(b)(1) of the Act, 21  U.S.C. section 360bbb-3(b)(1), unless the  authorization is terminated or revoked.     Resp Syncytial Virus by PCR NEGATIVE NEGATIVE Final    Comment: (NOTE) Fact Sheet for Patients: bloggercourse.com  Fact Sheet for Healthcare Providers: seriousbroker.it  This test is not yet approved or cleared by the [place]  FDA and has been authorized for detection and/or diagnosis of SARS-CoV-2 by FDA under an Emergency Use Authorization (EUA). This EUA will remain in effect (meaning this test can be used) for the duration of the COVID-19 declaration under Section 564(b)(1) of the Act, 21 U.S.C. section 360bbb-3(b)(1), unless the authorization is terminated or revoked.  Performed at [facility], [address], [place], [place] [address]   Urine Culture (for pregnant, neutropenic or urologic patients or patients with an indwelling urinary catheter)     Status: Abnormal   Collection Time: 10/13/24  9:25 AM   Specimen: Urine, Clean Catch  Result Value Ref Range Status   Specimen Description URINE, CLEAN CATCH  Final   Special Requests   Final    NONE Performed at [facility], [address], [place], [place] [address]    Culture MULTIPLE SPECIES PRESENT, SUGGEST RECOLLECTION (A)  Final   Report Status 10/14/2024 FINAL  Final    Radiology Reports  CT LIVER MASS BIOPSY Result Date: 10/17/2024 INDICATION: The patient has a right lobe liver mass which is difficult to see ultrasound as well as CT without IV contrast. Planned CT-guided biopsy. EXAM: CT-guided biopsy TECHNIQUE: Multidetector CT imaging of the abdomen was performed following the standard protocol without IV contrast. RADIATION DOSE REDUCTION: This exam was performed according to the departmental dose-optimization program which includes automated exposure control, adjustment of the mA and/or kV according to patient size and/or use of iterative reconstruction  technique. MEDICATIONS: None. ANESTHESIA/SEDATION: Moderate (conscious) sedation was employed during this procedure. A total of Versed 0.5 mg and Fentanyl 25 mcg was administered intravenously by the radiology nurse. Total intra-service moderate Sedation Time: 30 minutes. The patient's level of consciousness and vital signs were monitored continuously by radiology nursing throughout the procedure under my direct supervision. COMPLICATIONS: None immediate. PROCEDURE: Informed written consent was obtained from the patient after a thorough discussion of the procedural risks, benefits and alternatives. All questions were addressed. Maximal Sterile Barrier Technique was utilized including caps, mask, sterile gowns, sterile gloves, sterile drape, hand hygiene and skin antiseptic. A timeout was performed prior to the initiation of the procedure. With the patient in a right anterior oblique position axial images of the liver were obtained. The liver was evaluated and measurements were created. Radiopaque markers were then placed on the patient's right upper quadrant and axial imaging was performed in order to further evaluate percutaneous access. The patient's skin was then marked, prepped, and draped in usual sterile fashion. Local anesthesia was achieved with 1% lidocaine . A small incision was made in the right upper quadrant and an access needle was then advanced through the incision into the liver capsule. Repeat imaging demonstrated needle position in satisfactory position. The stylet was removed and 2 core needle biopsies were then performed of the suspected lesion in the right upper quadrant lateral aspect of the right lobe. All needles were then removed from the patient sterile dressing was applied. Samples were sent to pathology for further evaluation. IMPRESSION: Satisfactory needle core biopsy under CT guidance of a right lobe liver mass. Electronically Signed   By: Cordella Banner   On: 10/17/2024 10:01        Signature  -  Fairy Pool, DO Internal Medicine Resident, PGY-3 7:54 AM 10/18/2024   To contact the attending provider between 7A-7P or the covering provider during after hours 7P-7A, please log into the web site [email or web address] and access using universal [place] password for that web site. If you do not have the password, please call the hospital operator.

## 2024-10-18 NOTE — Progress Notes (Signed)
 Occupational Therapy Treatment Patient Details Name: Haley Randall MRN: [id number] DOB: Oct 29, 1936 Today's Date: 10/18/2024   History of present illness 88 y.o. female adm 10/13/24 with medical history significant of hypertension, hyperlipidemia, and gout presents with abdominal cramps.  Admitted for abdominal cramps.   OT comments  Patient with good progression toward patient focused goals.  Improved activity tolerance, but with assist to donn socks this date.  Patient generalized supervision for mobility, but trending closer to Mod I than from eval.  OT will continue efforts in the acute setting and no post acute OT is anticipated.        If plan is discharge home, recommend the following:  Assist for transportation;Assistance with cooking/housework   Equipment Recommendations  None recommended by OT    Recommendations for Other Services      Precautions / Restrictions Precautions Precautions: Fall Restrictions Weight Bearing Restrictions Per Provider Order: No       Mobility Bed Mobility               General bed mobility comments: Up in recliner    Transfers Overall transfer level: Needs assistance Equipment used: None Transfers: Sit to/from Stand Sit to Stand: Supervision     Step pivot transfers: Supervision           Balance Overall balance assessment: Needs assistance Sitting-balance support: Feet supported Sitting balance-Leahy Scale: Good     Standing balance support: No upper extremity supported Standing balance-Leahy Scale: Fair                             ADL either performed or assessed with clinical judgement   ADL       Grooming: Supervision/safety;Standing               Lower Body Dressing: Minimal assistance;Sit to/from stand   Toilet Transfer: Retail [employer]er;Ambulation                  Extremity/Trunk Assessment Upper Extremity Assessment Upper Extremity Assessment: Overall WFL for  tasks assessed            Vision Patient Visual Report: No change from baseline     Perception Perception Perception: Not tested   Praxis Praxis Praxis: Not tested   Communication Communication Communication: No apparent difficulties   Cognition Arousal: Alert Behavior During Therapy: WFL for tasks assessed/performed Cognition: History of cognitive impairments             OT - Cognition Comments: Forgetfulness and ST Memory deficits.                 Following commands: Intact        Cueing   Cueing Techniques: Verbal cues  Exercises      Shoulder Instructions       General Comments      Pertinent Vitals/ Pain       Pain Assessment Pain Assessment: No/denies pain                                                          Frequency  Min 2X/week        Progress Toward Goals  OT Goals(current goals can now be found in the care plan section)  Progress towards OT goals: Progressing toward  goals  Acute Rehab OT Goals OT Goal Formulation: With patient Time For Goal Achievement: 10/28/24 Potential to Achieve Goals: Good  Plan      Co-evaluation                 AM-PAC OT 6 Clicks Daily Activity     Outcome Measure   Help from another person eating meals?: None Help from another person taking care of personal grooming?: None Help from another person toileting, which includes using toliet, bedpan, or urinal?: A Little Help from another person bathing (including washing, rinsing, drying)?: A Little Help from another person to put on and taking off regular upper body clothing?: None Help from another person to put on and taking off regular lower body clothing?: A Little 6 Click Score: 21    End of Session    OT Visit Diagnosis: Unsteadiness on feet (R26.81)   Activity Tolerance Patient tolerated treatment well   Patient Left in chair;with call bell/phone within reach;with chair alarm set   Nurse  Communication Mobility status        Time: 1040-1100 OT Time Calculation (min): 20 min  Charges: OT General Charges $OT Visit: 1 Visit OT Treatments $Self Care/Home Management : 8-22 mins  10/18/2024  RP, OTR/L  Acute Rehabilitation Services  Office:  [phone]   Haley Randall 10/18/2024, 11:05 AM

## 2024-10-18 NOTE — Progress Notes (Signed)
 Physical Therapy Treatment Patient Details Name: Haley Randall MRN: [id number] DOB: 24-Oct-1936 Today's Date: 10/18/2024   History of Present Illness 88 y.o. female adm 10/13/24 with medical history significant of hypertension, hyperlipidemia, and gout presents with abdominal cramps.  Admitted for abdominal cramps.    PT Comments  Pt tolerated treatment well today. Trialed rollator today. Pt educated on proper brake management and safety with rollator. Pt did not appear to demonstrate the best carryover however was much more steady today compared to previous session. Pt also appeared to be more confused today compared to yesterday repeatedly asking what time it was and that she was late for school. No change in DC/DME recs at this time. PT will continue to follow.      If plan is discharge home, recommend the following: A little help with walking and/or transfers;A little help with bathing/dressing/bathroom;Assistance with cooking/housework;Help with stairs or ramp for entrance;Assist for transportation   Can travel by private Psychologist, Clinical (4 wheels)    Recommendations for Other Services       Precautions / Restrictions Precautions Precautions: Fall Restrictions Weight Bearing Restrictions Per Provider Order: No     Mobility  Bed Mobility Overal bed mobility: Needs Assistance Bed Mobility: Supine to Sit     Supine to sit: Supervision          Transfers Overall transfer level: Needs assistance Equipment used: None Transfers: Sit to/from Stand Sit to Stand: Supervision                Ambulation/Gait Ambulation/Gait assistance: Supervision Gait Distance (Feet): 1503 Feet Assistive device: Rollator (4 wheels) Gait Pattern/deviations: Narrow base of support, Scissoring, Decreased stride length, Step-through pattern Gait velocity: decreased     General Gait Details: trialed rollator today. Pt educated on proper brake  management and safety with rollator. Pt did not appear to demonstrate the best carryover however was much more steady today compared to previous session.   Stairs             Wheelchair Mobility     Tilt Bed    Modified Rankin (Stroke Patients Only)       Balance Overall balance assessment: Needs assistance Sitting-balance support: Feet supported Sitting balance-Leahy Scale: Good     Standing balance support: No upper extremity supported Standing balance-Leahy Scale: Fair                              Hotel Manager: No apparent difficulties  Cognition Arousal: Alert Behavior During Therapy: WFL for tasks assessed/performed                             Following commands: Intact      Cueing Cueing Techniques: Verbal cues  Exercises      General Comments General comments (skin integrity, edema, etc.): VSS      Pertinent Vitals/Pain Pain Assessment Pain Assessment: No/denies pain    Home Living                          Prior Function            PT Goals (current goals can now be found in the care plan section) Progress towards PT goals: Progressing toward goals    Frequency    Min 2X/week  PT Plan      Co-evaluation              AM-PAC PT 6 Clicks Mobility   Outcome Measure  Help needed turning from your back to your side while in a flat bed without using bedrails?: A Little Help needed moving from lying on your back to sitting on the side of a flat bed without using bedrails?: A Little Help needed moving to and from a bed to a chair (including a wheelchair)?: A Little Help needed standing up from a chair using your arms (e.g., wheelchair or bedside chair)?: A Little Help needed to walk in hospital room?: A Little Help needed climbing 3-5 steps with a railing? : A Little 6 Click Score: 18    End of Session Equipment Utilized During Treatment: Gait belt Activity  Tolerance: Patient tolerated treatment well Patient left: in chair;with call bell/phone within reach;with chair alarm set Nurse Communication: Mobility status PT Visit Diagnosis: Other abnormalities of gait and mobility (R26.89)     Time: 8580-8564 PT Time Calculation (min) (ACUTE ONLY): 16 min  Charges:    $Gait Training: 8-22 mins PT General Charges $$ ACUTE PT VISIT: 1 Visit                     Sueellen NOVAK, PT, DPT Acute Rehab Services [phone]    Deantae Shackleton 10/18/2024, 4:27 PM

## 2024-10-18 NOTE — TOC Initial Note (Signed)
 Transition of Care [facility]) - Initial/Assessment Note    Patient Details  Name: Haley Randall MRN: [id number] Date of Birth: 08-27-1936  [facility]) CM/SW Contact:    Marval Gell, RN Phone Number: 10/18/2024, 11:19 AM  Clinical Narrative:                  Spoke w patient at bedside to discuss dispo plan. She would like to return home w [facility] services.  Discissed HH options, patient had no preference, referral accepted by [facility], Added to HUB and AVS.  Patient states that she has DME RW at home and does not need any further equipment.  Patient states that grandson will transport home   Expected Discharge Plan: Home w Home Health Services Barriers to Discharge: Continued Medical Work up   Patient Goals and CMS Choice Patient states their goals for this hospitalization and ongoing recovery are:: to go home CMS Medicare.gov Compare Post Acute Care list provided to:: Patient Choice offered to / list presented to : Patient      Expected Discharge Plan and Services   Discharge Planning Services: CM Consult Post Acute Care Choice: Home Health Living arrangements for the past 2 months: Single Family Home                 DME Arranged: N/A         HH Arranged: PT HH Agency: [facility] Date [facility] Agency Contacted: 10/18/24 Time HH Agency Contacted: 1119 Representative spoke with at [facility] Agency: Darleene  Prior Living Arrangements/Services Living arrangements for the past 2 months: Single Family Home                     Activities of Daily Living   ADL Screening (condition at time of admission) Independently performs ADLs?: No Does the patient have a NEW difficulty with bathing/dressing/toileting/self-feeding that is expected to last >3 days?: Yes (Initiates electronic notice to provider for possible OT consult) Does the patient have a NEW difficulty with getting in/out of bed, walking, or climbing stairs that is expected to last >3 days?: Yes (Initiates  electronic notice to provider for possible PT consult) Does the patient have a NEW difficulty with communication that is expected to last >3 days?: No Is the patient deaf or have difficulty hearing?: No Does the patient have difficulty seeing, even when wearing glasses/contacts?: No Does the patient have difficulty concentrating, remembering, or making decisions?: Yes  Permission Sought/Granted                  Emotional Assessment              Admission diagnosis:  Transaminitis [R74.01] Liver lesion [K76.9] Abdominal pain [R10.9] Abdominal pain, unspecified abdominal location [R10.9] Patient Active Problem List   Diagnosis Date Noted   Pancreatitis 10/14/2024   Gallbladder dilatation 10/14/2024   Acute metabolic encephalopathy 10/14/2024   Leukocytosis 10/14/2024   Elevated liver function tests 10/14/2024   Liver lesion, right lobe 10/14/2024   Abdominal pain 10/13/2024   Vitamin D  insufficiency 08/07/2023   Chronic kidney disease, stage 3b (HCC) 08/07/2023   Primary hyperparathyroidism 10/07/2022   Anemia 10/02/2022   Hyperglycemia 10/02/2022   Hypercalcemia 10/02/2022   Orthostatic hypotension 06/24/2022   Primary hypertension 11/19/2021   Seborrheic dermatitis of scalp 11/19/2021   Stage 3a chronic kidney disease (HCC) 03/04/2021   Carotid atherosclerosis, bilateral 08/24/2018   HLD (hyperlipidemia) 11/08/2015   PCP:  Corwin Antu, FNP Pharmacy:  [id number] [address] [place],  - [address] [place] [address] Phone: [phone] Fax: [phone]     Social Drivers of Health (SDOH) Social History: SDOH Screenings   Food Insecurity: No Food Insecurity (10/13/2024)  Housing: Low Risk  (10/13/2024)  Transportation Needs: No Transportation Needs (10/13/2024)  Utilities: Not At Risk (10/13/2024)  Alcohol Screen: Low Risk  (08/24/2023)  Depression (PHQ2-9): Low Risk  (08/24/2023)  Financial Resource Strain: Low Risk   (08/24/2023)  Physical Activity: Inactive (08/24/2023)  Social Connections: Socially Isolated (10/13/2024)  Stress: No Stress Concern Present (08/24/2023)  Tobacco Use: Medium Risk (10/13/2024)  Health Literacy: Adequate Health Literacy (08/24/2023)   SDOH Interventions:     Readmission Risk Interventions     No data to display

## 2024-10-19 DIAGNOSIS — R109 Unspecified abdominal pain: Secondary | ICD-10-CM | POA: Diagnosis not present

## 2024-10-19 LAB — COMPREHENSIVE METABOLIC PANEL WITH GFR
ALT: 39 U/L (ref 0–44)
AST: 30 U/L (ref 15–41)
Albumin: 2 g/dL — ABNORMAL LOW (ref 3.5–5.0)
Alkaline Phosphatase: 62 U/L (ref 38–126)
Anion gap: 12 (ref 5–15)
BUN: 11 mg/dL (ref 8–23)
CO2: 19 mmol/L — ABNORMAL LOW (ref 22–32)
Calcium: 8.6 mg/dL — ABNORMAL LOW (ref 8.9–10.3)
Chloride: 107 mmol/L (ref 98–111)
Creatinine, Ser: 1.39 mg/dL — ABNORMAL HIGH (ref 0.44–1.00)
GFR, Estimated: 37 mL/min — ABNORMAL LOW (ref >60)
Glucose, Bld: 107 mg/dL — ABNORMAL HIGH (ref 70–99)
Potassium: 2.9 mmol/L — ABNORMAL LOW (ref 3.5–5.1)
Sodium: 138 mmol/L (ref 135–145)
Total Bilirubin: 0.9 mg/dL (ref 0.0–1.2)
Total Protein: 5.3 g/dL — ABNORMAL LOW (ref 6.5–8.1)

## 2024-10-19 LAB — CBC WITH DIFFERENTIAL/PLATELET
Abs Immature Granulocytes: 0.26 K/uL — ABNORMAL HIGH (ref 0.00–0.07)
Basophils Absolute: 0 K/uL (ref 0.0–0.1)
Basophils Relative: 0 %
Eosinophils Absolute: 0.1 K/uL (ref 0.0–0.5)
Eosinophils Relative: 1 %
HCT: 22.7 % — ABNORMAL LOW (ref 36.0–46.0)
Hemoglobin: 7.3 g/dL — ABNORMAL LOW (ref 12.0–15.0)
Immature Granulocytes: 3 %
Lymphocytes Relative: 9 %
Lymphs Abs: 0.7 K/uL (ref 0.7–4.0)
MCH: 25.7 pg — ABNORMAL LOW (ref 26.0–34.0)
MCHC: 32.2 g/dL (ref 30.0–36.0)
MCV: 79.9 fL — ABNORMAL LOW (ref 80.0–100.0)
Monocytes Absolute: 0.8 K/uL (ref 0.1–1.0)
Monocytes Relative: 10 %
Neutro Abs: 6.2 K/uL (ref 1.7–7.7)
Neutrophils Relative %: 77 %
Platelets: 155 K/uL (ref 150–400)
RBC: 2.84 MIL/uL — ABNORMAL LOW (ref 3.87–5.11)
RDW: 16.3 % — ABNORMAL HIGH (ref 11.5–15.5)
WBC: 8.1 K/uL (ref 4.0–10.5)
nRBC: 1 % — ABNORMAL HIGH (ref 0.0–0.2)

## 2024-10-19 LAB — PHOSPHORUS: Phosphorus: 2.8 mg/dL (ref 2.5–4.6)

## 2024-10-19 LAB — PREPARE RBC (CROSSMATCH)

## 2024-10-19 LAB — HEMOGLOBIN AND HEMATOCRIT, BLOOD
HCT: 31.6 % — ABNORMAL LOW (ref 36.0–46.0)
Hemoglobin: 10.6 g/dL — ABNORMAL LOW (ref 12.0–15.0)

## 2024-10-19 LAB — MAGNESIUM: Magnesium: 1.6 mg/dL — ABNORMAL LOW (ref 1.7–2.4)

## 2024-10-19 MED ORDER — SODIUM CHLORIDE 0.9% IV SOLUTION: Freq: Once | INTRAVENOUS | Status: AC

## 2024-10-19 MED ORDER — MAGNESIUM SULFATE 2 GM/50ML IV SOLN
2.0000 g | Freq: Once | INTRAVENOUS | Status: AC
  Administered 2024-10-19: 2 g via INTRAVENOUS
  Filled 2024-10-19: qty 50

## 2024-10-19 MED ORDER — AMLODIPINE BESYLATE 10 MG PO TABS
10.0000 mg | ORAL_TABLET | Freq: Every day | ORAL | Status: DC
  Administered 2024-10-19 – 2024-10-31 (×13): 10 mg via ORAL
  Filled 2024-10-19 (×13): qty 1

## 2024-10-19 MED ORDER — POTASSIUM CHLORIDE CRYS ER 20 MEQ PO TBCR
40.0000 meq | EXTENDED_RELEASE_TABLET | ORAL | Status: AC
  Administered 2024-10-19 (×2): 40 meq via ORAL
  Filled 2024-10-19 (×2): qty 2

## 2024-10-19 NOTE — TOC Progression Note (Signed)
 Transition of Care [facility]) - Progression Note    Patient Details  Name: Haley Randall MRN: [id number] Date of Birth: [birth year]  Transition of Care [facility]) CM/SW Contact  Marval Gell, RN Phone Number: 10/19/2024, 11:53 AM  Clinical Narrative:     Reviewed plan for DC with patient, she is still agreeable. She states that her 86 year old grandson lives with her, and her daughter is a phone call away. SHe states she feels comfortable DCing to home and being alone in the day, stating I want to go home. Anticipate DC in next 24 hours.  She states her daughter will provide transportation home   Expected Discharge Plan: Home w Home Health Services Barriers to Discharge: Continued Medical Work up               Expected Discharge Plan and Services   Discharge Planning Services: CM Consult Post Acute Care Choice: Home Health Living arrangements for the past 2 months: Single Family Home                 DME Arranged: N/A         HH Arranged: PT HH Agency: [facility] Date [facility] Agency Contacted: 10/18/24 Time HH Agency Contacted: 1119 Representative spoke with at [facility] Agency: Darleene   Social Drivers of Health (SDOH) Interventions SDOH Screenings   Food Insecurity: No Food Insecurity (10/13/2024)  Housing: Low Risk  (10/13/2024)  Transportation Needs: No Transportation Needs (10/13/2024)  Utilities: Not At Risk (10/13/2024)  Alcohol Screen: Low Risk  (08/24/2023)  Depression (PHQ2-9): Low Risk  (08/24/2023)  Financial Resource Strain: Low Risk  (08/24/2023)  Physical Activity: Inactive (08/24/2023)  Social Connections: Socially Isolated (10/13/2024)  Stress: No Stress Concern Present (08/24/2023)  Tobacco Use: Medium Risk (10/13/2024)  Health Literacy: Adequate Health Literacy (08/24/2023)    Readmission Risk Interventions     No data to display

## 2024-10-19 NOTE — Progress Notes (Signed)
 PROGRESS NOTE        PATIENT DETAILS Name: Haley Randall Age: 88 y.o. Sex: female Date of Birth: 1936/04/18 Admit Date: 10/13/2024 Admitting Physician [name], MD ERE:Ilhjo, Ginger, FNP  Brief Summary: Patient is a 88 y.o.  female with history of HTN, HLD-who presented with abdominal pain-found to have pancreatitis/vague liver lesions on CT abdomen.  Significant events: 11/13>> admit to [facility]  Significant studies: 11/13>> CXR: No PNA 11/13>> CT abdomen/pelvis: 4.8 x 3.1 cm subtle lesion-right liver-additional small lesions measuring up to 2.1 cm.  Gallbladder distended.  Edema/inflammation along the descending ascending colon/hepatic flexure. 11/13>> RUQ ultrasound: Gallbladder wall upper normal to borderline thickened, no echogenic gallstones.  CBD 8 mm. 11/14>> MRI abdomen: Multicystic enhancing lesions in the liver-abscesses versus metastatic disease 10/17/2024.  CT-guided liver biopsy by IR scheduled.  Significant microbiology data: 11/13>> COVID/influenza/RSV PCR: Negative 11/13>> urine culture: Multiple species 11/13>> 1/2: Gram-positive cocci (prelim Staph epidermidis)-likely contamination. 11/14>> acute hepatitis serology: Negative 11/14>> RPR: Nonreactive     Consults: General surgery IR  Subjective:  Patient laying in bed.  No return of abdominal pain.  She is having loose bowel movements but otherwise no new or worsening concerns.  Objective: Vitals: Blood pressure (!) 181/45, pulse 86, temperature 98.3 F (36.8 C), temperature source Oral, resp. rate (!) 21, height 4' 11 (1.499 m), weight 52.3 kg, SpO2 97%.   Exam:  Awake Alert, No new F.N deficits, Normal affect [place].AT,PERRAL Supple Neck, No JVD,   Symmetrical Chest wall movement, Good air movement bilaterally, CTAB RRR,No Gallops, Rubs or new Murmurs,  +ve B.Sounds, Abd Soft, nontender No Cyanosis, Clubbing or edema     Assessment/Plan:  Acute pancreatitis No  gallstones seen on imaging studies-no history of EtOH use- ?Related to malignancy (liver lesion seen on imaging studies).  MRCP negative for choledocholithiasis-no major pancreatic abnormality on MRI. Continues to improve and is hydrating well without IV fluids Tolerating regular diet  Acute metabolic encephalopathy Unclear etiology-?  Colitis/fentanyl that she received in the emergency room-superimposed on some cognitive issues at baseline (daughter acknowledges mild cognitive issues at baseline) Resolved after admission and stable this morning without any signs of confusion Maintain delirium precautions  Transaminitis Hyperbilirubinemia Potentially due to mass effect from liver lesions-alternatively could could have passed a small stone Acute hepatitis serology negative Transaminases and bilirubin down from admission and now within normal limits MRCP as above S/p liver biopsy 11/17 Trend LFTs Continue supportive care  Multicystic enhancing liver lesions on MRCP abdomen  Unclear whether this is abscess (does have colitis on CT imaging) versus metastatic disease Continue Zosyn S/p liver mass biopsy 11/17, pathology pending, unable to get Gram stain and culture due to specimen being put in formalin  NAGMA Bicarb 19 this morning  Improved after stopping bicarb free fluids Continue to monitor   ? Colitis No diarrhea Given liver masses-this could be a real issue that has caused liver abscesses. Continue Zosyn, day 7 See above regarding liver biopsy Discuss with GI given the lack of culture data as above  HTN Systolics remain above normal with some up to 180 while diastolics remain low 181/45 this morning No return of tachycardia after starting metoprolol Hold off on resuming amlodipine  Trial TED stockings  Normocytic anemia No recent baseline but hemoglobin had been normal over 1 year ago Slight drop since admission from 11-8 with significant  hemodilution Continue drop this  morning from 8.1-7.3 No source of bleeding identified and likely more contributable to chronic anemia S/p liver biopsy 11/17 without signs of new bleeding Iron labs last checked in 2023 Iron labs most consistent with anemia of chronic disease 1 unit PRBCs today and continue ferrous sulfate  Hypomagnesemia Continue to monitor and replete as needed  Hypokalemia Continue to monitor and replete as needed  Hypophosphatemia 2.8 today after sodium phosphate yesterday  Gram-positive bacteremia-coag negative staph (preliminary result)  likely a contaminant Await final results-doubt needs to be treated at this point.  ?  Mild dementia/cognitive issues B12/RPR all stable Ammonia level is only minimally elevated-doubt of any clinical significance. No confusion this morning-continue to maintain delirium precautions  Elevated TSH TSH 5.8 with T4 1.04.  Likely sick euthyroid and will need repeat TSH in 4 to 6 weeks  Palliative care Previously had long discussion with the patient/daughter regarding findings on MRI that could represent metastatic disease.  Underwent liver biopsy 11/17 and awaiting results but if metastatic disease they seem to be leaning towards stopping further workup but will continue to discuss  Code status:   Code Status: Full Code   DVT Prophylaxis: enoxaparin (LOVENOX) injection 30 mg Start: 10/18/24 1000 Place and maintain sequential compression device Start: 10/16/24 0843   Family Communication: Daughter [name] [phone]  called and updated over the phone on 10/17/2024  Disposition Plan: Status is: Observation The patient will require care spanning > 2 midnights and should be moved to inpatient because: Severity of illness   Planned Discharge Destination:Home   Diet: Diet Order             Diet regular Room service appropriate? Yes; Fluid consistency: Thin  Diet effective now                    MEDICATIONS: Scheduled Meds:  enoxaparin (LOVENOX)  injection  30 mg Subcutaneous Daily   feeding supplement (GLUCERNA SHAKE)  237 mL Oral TID BM   ferrous sulfate  325 mg Oral Q breakfast   folic acid  1 mg Oral Daily   metoprolol tartrate  50 mg Oral BID   pantoprazole (PROTONIX) IV  40 mg Intravenous Q12H   potassium chloride  40 mEq Oral Q4H   Continuous Infusions:  magnesium sulfate bolus IVPB     piperacillin-tazobactam (ZOSYN)  IV 3.375 g (10/19/24 0522)   PRN Meds:.albuterol, diltiazem, fentaNYL (SUBLIMAZE) injection, hydrALAZINE, ondansetron **OR** ondansetron (ZOFRAN) IV, oxyCODONE   I have personally reviewed following labs and imaging studies  LABORATORY DATA:   Data Review:   Inpatient Medications  Scheduled Meds:  enoxaparin (LOVENOX) injection  30 mg Subcutaneous Daily   feeding supplement (GLUCERNA SHAKE)  237 mL Oral TID BM   ferrous sulfate  325 mg Oral Q breakfast   folic acid  1 mg Oral Daily   metoprolol tartrate  50 mg Oral BID   pantoprazole (PROTONIX) IV  40 mg Intravenous Q12H   potassium chloride  40 mEq Oral Q4H   Continuous Infusions:  magnesium sulfate bolus IVPB     piperacillin-tazobactam (ZOSYN)  IV 3.375 g (10/19/24 0522)   PRN Meds:.albuterol, diltiazem, fentaNYL (SUBLIMAZE) injection, hydrALAZINE, ondansetron **OR** ondansetron (ZOFRAN) IV, oxyCODONE  DVT Prophylaxis  enoxaparin (LOVENOX) injection 30 mg Start: 10/18/24 1000 Place and maintain sequential compression device Start: 10/16/24 0843   Recent Labs  Lab 10/13/24 0517 10/13/24 0518 10/15/24 0344 10/16/24 0627 10/17/24 0655 10/18/24 0640 10/19/24 0347  WBC 17.4*   < >  11.4* 8.5 8.0 9.5 8.1  HGB 10.8*   < > 9.1* 8.6* 8.2* 8.1* 7.3*  HCT 34.6*   < > 28.7* 27.3* 25.2* 25.3* 22.7*  PLT 209   < > 121* 125* 153 176 155  MCV 82.8   < > 80.8 82.5 80.3 80.1 79.9*  MCH 25.8*   < > 25.6* 26.0 26.1 25.6* 25.7*  MCHC 31.2   < > 31.7 31.5 32.5 32.0 32.2  RDW 15.3   < > 16.0* 16.4* 16.3* 16.3* 16.3*  LYMPHSABS 1.6  --   --  0.4*  0.4* 0.7 0.7  MONOABS 0.7  --   --  0.2 0.6 0.7 0.8  EOSABS 0.0  --   --  0.0 0.1 0.1 0.1  BASOSABS 0.0  --   --  0.0 0.0 0.0 0.0   < > = values in this interval not displayed.    Recent Labs  Lab 10/13/24 0517 10/13/24 0518 10/13/24 0640 10/13/24 1756 10/14/24 0237 10/14/24 1614 10/15/24 0344 10/16/24 0627 10/17/24 0404 10/17/24 0655 10/18/24 0343 10/19/24 0347  NA 140 141  --   --    < >  --  137 137  --  137 136 138  K 3.9 3.9  --   --    < >  --  3.2* 3.8  --  3.3* 4.2 2.9*  CL 108 110  --   --    < >  --  110 108  --  108 109 107  CO2 19*  --   --   --    < >  --  18* 17*  --  19* 16* 19*  ANIONGAP 13  --   --   --    < >  --  9 12  --  10 11 12   GLUCOSE 201* 203*  --   --    < >  --  117* 92  --  108* 109* 107*  BUN 18 18  --   --    < >  --  19 17  --  16 13 11   CREATININE 1.44* 1.40*  --   --    < >  --  1.57* 1.45*  --  1.26* 1.43* 1.39*  AST 293*  --   --   --    < >  --  135* 61*  --  40 49* 30  ALT 102*  --   --   --    < >  --  134* 83*  --  58* 60* 39  ALKPHOS 88  --   --   --    < >  --  50 57  --  61 67 62  BILITOT 1.7*  --   --   --    < >  --  2.4* 2.4*  --  1.7* 1.6* 0.9  ALBUMIN 3.5  --   --   --    < >  --  2.4* 2.2*  --  2.0* 2.4* 2.0*  LATICACIDVEN  --  2.7* 1.8  --   --   --   --   --   --   --   --   --   INR 1.1  --   --   --   --   --   --   --  1.1  --   --   --   TSH  --   --   --   --   --  1.241  --   --   --   --  5.880*  --   AMMONIA  --   --   --  37*  --   --   --   --   --   --   --   --   MG  --   --   --   --    < >  --  2.7* 2.2  --  2.0 1.9 1.6*  PHOS  --   --   --   --   --   --   --   --   --   --  1.3* 2.8  CALCIUM  9.4  --   --   --    < >  --  8.2* 8.6*  --  8.5* 9.2 8.6*   < > = values in this interval not displayed.      Recent Labs  Lab 10/13/24 0517 10/13/24 0518 10/13/24 0640 10/13/24 1756 10/14/24 0237 10/14/24 1614 10/15/24 0344 10/16/24 0627 10/17/24 0404 10/17/24 0655 10/18/24 0343 10/19/24 0347   LATICACIDVEN  --  2.7* 1.8  --   --   --   --   --   --   --   --   --   INR 1.1  --   --   --   --   --   --   --  1.1  --   --   --   TSH  --   --   --   --   --  1.241  --   --   --   --  5.880*  --   AMMONIA  --   --   --  37*  --   --   --   --   --   --   --   --   MG  --   --   --   --    < >  --  2.7* 2.2  --  2.0 1.9 1.6*  CALCIUM  9.4  --   --   --    < >  --  8.2* 8.6*  --  8.5* 9.2 8.6*   < > = values in this interval not displayed.    --------------------------------------------------------------------------------------------------------------- Lab Results  Component Value Date   CHOL 157 09/01/2023   HDL 86 09/01/2023   LDLCALC 57 09/01/2023   TRIG 74 09/01/2023   CHOLHDL 1.8 09/01/2023    Lab Results  Component Value Date   HGBA1C 6.2 10/02/2022   Recent Labs    10/18/24 0343  TSH 5.880*  FREET4 1.04   Recent Labs    10/18/24 0640  VITAMINB12 826  FERRITIN 190  TIBC 288  IRON 33    ------------------------------------------------------------------------------------------------------------------ Cardiac Enzymes No results for input(s): CKMB, TROPONINI, MYOGLOBIN in the last 168 hours.  Invalid input(s): CK  Micro Results Recent Results (from the past 240 hours)  Blood Culture (routine x 2)     Status: None   Collection Time: 10/13/24  5:17 AM   Specimen: BLOOD  Result Value Ref Range Status   Specimen Description BLOOD BLOOD LEFT ARM  Final   Special Requests   Final    BOTTLES DRAWN AEROBIC AND ANAEROBIC Blood Culture adequate volume   Culture   Final    NO GROWTH 5 DAYS Performed at [facility], [address] [place], [place] [address]    Report Status  10/18/2024 FINAL  Final  Blood Culture (routine x 2)     Status: Abnormal   Collection Time: 10/13/24  5:17 AM   Specimen: BLOOD  Result Value Ref Range Status   Specimen Description BLOOD BLOOD LEFT HAND  Final   Special Requests   Final    BOTTLES DRAWN AEROBIC ONLY  Blood Culture results may not be optimal due to an inadequate volume of blood received in culture bottles   Culture  Setup Time   Final    GRAM POSITIVE COCCI IN CLUSTERS AEROBIC BOTTLE ONLY CRITICAL RESULT CALLED TO, READ BACK BY AND VERIFIED WITH: PHARMD Jeremy on [phone] @1010  by SM    Culture (A)  Final    STAPHYLOCOCCUS EPIDERMIDIS THE SIGNIFICANCE OF ISOLATING THIS ORGANISM FROM A SINGLE SET OF BLOOD CULTURES WHEN MULTIPLE SETS ARE DRAWN IS UNCERTAIN. PLEASE NOTIFY THE MICROBIOLOGY DEPARTMENT WITHIN ONE WEEK IF SPECIATION AND SENSITIVITIES ARE REQUIRED. Performed at [facility], [address], [place], [place] [address]    Report Status 10/15/2024 FINAL  Final  Blood Culture ID Panel (Reflexed)     Status: Abnormal   Collection Time: 10/13/24  5:17 AM  Result Value Ref Range Status   Enterococcus faecalis NOT DETECTED NOT DETECTED Final   Enterococcus Faecium NOT DETECTED NOT DETECTED Final   Listeria monocytogenes NOT DETECTED NOT DETECTED Final   Staphylococcus species DETECTED (A) NOT DETECTED Final    Comment: CRITICAL RESULT CALLED TO, READ BACK BY AND VERIFIED WITH: PHARMD Jeremy on [phone] @1010  by SM    Staphylococcus aureus (BCID) NOT DETECTED NOT DETECTED Final   Staphylococcus epidermidis DETECTED (A) NOT DETECTED Final    Comment: CRITICAL RESULT CALLED TO, READ BACK BY AND VERIFIED WITH: PHARMD Jeremy on [phone] @1010  by SM    Staphylococcus lugdunensis NOT DETECTED NOT DETECTED Final   Streptococcus species NOT DETECTED NOT DETECTED Final   Streptococcus agalactiae NOT DETECTED NOT DETECTED Final   Streptococcus pneumoniae NOT DETECTED NOT DETECTED Final   Streptococcus pyogenes NOT DETECTED NOT DETECTED Final   A.calcoaceticus-baumannii NOT DETECTED NOT DETECTED Final   Bacteroides fragilis NOT DETECTED NOT DETECTED Final   Enterobacterales NOT DETECTED NOT DETECTED Final   Enterobacter cloacae complex NOT DETECTED NOT DETECTED Final   Escherichia coli  NOT DETECTED NOT DETECTED Final   Klebsiella aerogenes NOT DETECTED NOT DETECTED Final   Klebsiella oxytoca NOT DETECTED NOT DETECTED Final   Klebsiella pneumoniae NOT DETECTED NOT DETECTED Final   Proteus species NOT DETECTED NOT DETECTED Final   Salmonella species NOT DETECTED NOT DETECTED Final   Serratia marcescens NOT DETECTED NOT DETECTED Final   Haemophilus influenzae NOT DETECTED NOT DETECTED Final   Neisseria meningitidis NOT DETECTED NOT DETECTED Final   Pseudomonas aeruginosa NOT DETECTED NOT DETECTED Final   Stenotrophomonas maltophilia NOT DETECTED NOT DETECTED Final   Candida albicans NOT DETECTED NOT DETECTED Final   Candida auris NOT DETECTED NOT DETECTED Final   Candida glabrata NOT DETECTED NOT DETECTED Final   Candida krusei NOT DETECTED NOT DETECTED Final   Candida parapsilosis NOT DETECTED NOT DETECTED Final   Candida tropicalis NOT DETECTED NOT DETECTED Final   Cryptococcus neoformans/gattii NOT DETECTED NOT DETECTED Final   Methicillin resistance mecA/C NOT DETECTED NOT DETECTED Final    Comment: Performed at [facility], [address] [place], [place] [address]  Resp panel by RT-PCR (RSV, Flu A&B, Covid) Anterior Nasal Swab     Status: None   Collection Time: 10/13/24  7:14 AM   Specimen: Anterior Nasal Swab  Result Value Ref Range Status   SARS Coronavirus 2 by RT PCR NEGATIVE NEGATIVE Final   Influenza A by PCR NEGATIVE NEGATIVE Final   Influenza B by PCR NEGATIVE NEGATIVE Final    Comment: (NOTE) The Xpert Xpress SARS-CoV-2/FLU/RSV plus assay is intended as an aid in the diagnosis of influenza from Nasopharyngeal swab specimens and should not be used as a sole basis for treatment. Nasal washings and aspirates are unacceptable for Xpert Xpress SARS-CoV-2/FLU/RSV testing.  Fact Sheet for Patients: bloggercourse.com  Fact Sheet for Healthcare Providers: seriousbroker.it  This test is not yet  approved or cleared by the [place]  FDA and has been authorized for detection and/or diagnosis of SARS-CoV-2 by FDA under an Emergency Use Authorization (EUA). This EUA will remain in effect (meaning this test can be used) for the duration of the COVID-19 declaration under Section 564(b)(1) of the Act, 21 U.S.C. section 360bbb-3(b)(1), unless the authorization is terminated or revoked.     Resp Syncytial Virus by PCR NEGATIVE NEGATIVE Final    Comment: (NOTE) Fact Sheet for Patients: bloggercourse.com  Fact Sheet for Healthcare Providers: seriousbroker.it  This test is not yet approved or cleared by the [place]  FDA and has been authorized for detection and/or diagnosis of SARS-CoV-2 by FDA under an Emergency Use Authorization (EUA). This EUA will remain in effect (meaning this test can be used) for the duration of the COVID-19 declaration under Section 564(b)(1) of the Act, 21 U.S.C. section 360bbb-3(b)(1), unless the authorization is terminated or revoked.  Performed at [facility], [address], [place], [place] [address]   Urine Culture (for pregnant, neutropenic or urologic patients or patients with an indwelling urinary catheter)     Status: Abnormal   Collection Time: 10/13/24  9:25 AM   Specimen: Urine, Clean Catch  Result Value Ref Range Status   Specimen Description URINE, CLEAN CATCH  Final   Special Requests   Final    NONE Performed at [facility], [address] [place], [place] [address]    Culture MULTIPLE SPECIES PRESENT, SUGGEST RECOLLECTION (A)  Final   Report Status 10/14/2024 FINAL  Final    Radiology Reports  CT LIVER MASS BIOPSY Result Date: 10/17/2024 INDICATION: The patient has a right lobe liver mass which is difficult to see ultrasound as well as CT without IV contrast. Planned CT-guided biopsy. EXAM: CT-guided biopsy TECHNIQUE: Multidetector CT imaging of the abdomen  was performed following the standard protocol without IV contrast. RADIATION DOSE REDUCTION: This exam was performed according to the departmental dose-optimization program which includes automated exposure control, adjustment of the mA and/or kV according to patient size and/or use of iterative reconstruction technique. MEDICATIONS: None. ANESTHESIA/SEDATION: Moderate (conscious) sedation was employed during this procedure. A total of Versed 0.5 mg and Fentanyl 25 mcg was administered intravenously by the radiology nurse. Total intra-service moderate Sedation Time: 30 minutes. The patient's level of consciousness and vital signs were monitored continuously by radiology nursing throughout the procedure under my direct supervision. COMPLICATIONS: None immediate. PROCEDURE: Informed written consent was obtained from the patient after a thorough discussion of the procedural risks, benefits and alternatives. All questions were addressed. Maximal Sterile Barrier Technique was utilized including caps, mask, sterile gowns, sterile gloves, sterile drape, hand hygiene and skin antiseptic. A timeout was performed prior to the initiation of the procedure. With the patient in a right anterior oblique position axial images of the liver were  obtained. The liver was evaluated and measurements were created. Radiopaque markers were then placed on the patient's right upper quadrant and axial imaging was performed in order to further evaluate percutaneous access. The patient's skin was then marked, prepped, and draped in usual sterile fashion. Local anesthesia was achieved with 1% lidocaine . A small incision was made in the right upper quadrant and an access needle was then advanced through the incision into the liver capsule. Repeat imaging demonstrated needle position in satisfactory position. The stylet was removed and 2 core needle biopsies were then performed of the suspected lesion in the right upper quadrant lateral aspect of  the right lobe. All needles were then removed from the patient sterile dressing was applied. Samples were sent to pathology for further evaluation. IMPRESSION: Satisfactory needle core biopsy under CT guidance of a right lobe liver mass. Electronically Signed   By: Cordella Banner   On: 10/17/2024 10:01       Signature  -    Fairy Pool, DO Internal Medicine Resident, PGY-3 9:20 AM 10/19/2024   To contact the attending provider between 7A-7P or the covering provider during after hours 7P-7A, please log into the web site [email or web address] and access using universal [place] password for that web site. If you do not have the password, please call the hospital operator.

## 2024-10-19 NOTE — Progress Notes (Signed)
   10/19/24 1254  Mobility  Activity Ambulated with assistance  Level of Assistance Minimal assist, patient does 75% or more  Assistive Device Other (Comment) (HHA)  Distance Ambulated (ft) 150 ft  Activity Response Tolerated well  Mobility Referral Yes  Mobility visit 1 Mobility  Mobility Specialist Start Time (ACUTE ONLY) 1254  Mobility Specialist Stop Time (ACUTE ONLY) 1304  Mobility Specialist Time Calculation (min) (ACUTE ONLY) 10 min   Mobility Specialist: Progress Note  Pt agreeable to mobility session - received in bed. Pt was asymptomatic throughout session with no complaints.Returned to chair with all needs met - call bell within reach. Chair alarm on.  Additional comments: MS responded to call light, pt received standing in room c/o BR urgency. Pt with large liquid BM, required pericare clean up, returned to chair with chair alarm on.   Virgle Boards, BS Mobility Specialist Please contact via SecureChat or  Rehab office at [phone].

## 2024-10-19 NOTE — Plan of Care (Signed)

## 2024-10-20 DIAGNOSIS — R109 Unspecified abdominal pain: Secondary | ICD-10-CM | POA: Diagnosis not present

## 2024-10-20 LAB — CBC WITH DIFFERENTIAL/PLATELET
Abs Immature Granulocytes: 0.37 K/uL — ABNORMAL HIGH (ref 0.00–0.07)
Basophils Absolute: 0.1 K/uL (ref 0.0–0.1)
Basophils Relative: 0 %
Eosinophils Absolute: 0 K/uL (ref 0.0–0.5)
Eosinophils Relative: 0 %
HCT: 32.2 % — ABNORMAL LOW (ref 36.0–46.0)
Hemoglobin: 10.5 g/dL — ABNORMAL LOW (ref 12.0–15.0)
Immature Granulocytes: 3 %
Lymphocytes Relative: 6 %
Lymphs Abs: 0.8 K/uL (ref 0.7–4.0)
MCH: 26.8 pg (ref 26.0–34.0)
MCHC: 32.6 g/dL (ref 30.0–36.0)
MCV: 82.1 fL (ref 80.0–100.0)
Monocytes Absolute: 1 K/uL (ref 0.1–1.0)
Monocytes Relative: 7 %
Neutro Abs: 11 K/uL — ABNORMAL HIGH (ref 1.7–7.7)
Neutrophils Relative %: 84 %
Platelets: 221 K/uL (ref 150–400)
RBC: 3.92 MIL/uL (ref 3.87–5.11)
RDW: 16.4 % — ABNORMAL HIGH (ref 11.5–15.5)
WBC: 13.2 K/uL — ABNORMAL HIGH (ref 4.0–10.5)
nRBC: 0.3 % — ABNORMAL HIGH (ref 0.0–0.2)

## 2024-10-20 LAB — COMPREHENSIVE METABOLIC PANEL WITH GFR
ALT: 34 U/L (ref 0–44)
AST: 27 U/L (ref 15–41)
Albumin: 2.3 g/dL — ABNORMAL LOW (ref 3.5–5.0)
Alkaline Phosphatase: 71 U/L (ref 38–126)
Anion gap: 16 — ABNORMAL HIGH (ref 5–15)
BUN: 9 mg/dL (ref 8–23)
CO2: 18 mmol/L — ABNORMAL LOW (ref 22–32)
Calcium: 9.3 mg/dL (ref 8.9–10.3)
Chloride: 102 mmol/L (ref 98–111)
Creatinine, Ser: 1.34 mg/dL — ABNORMAL HIGH (ref 0.44–1.00)
GFR, Estimated: 39 mL/min — ABNORMAL LOW (ref >60)
Glucose, Bld: 81 mg/dL (ref 70–99)
Potassium: 3.4 mmol/L — ABNORMAL LOW (ref 3.5–5.1)
Sodium: 136 mmol/L (ref 135–145)
Total Bilirubin: 1.5 mg/dL — ABNORMAL HIGH (ref 0.0–1.2)
Total Protein: 5.8 g/dL — ABNORMAL LOW (ref 6.5–8.1)

## 2024-10-20 LAB — TYPE AND SCREEN
ABO/RH(D): AB POS
Antibody Screen: NEGATIVE
Unit division: 0

## 2024-10-20 LAB — BPAM RBC
Blood Product Expiration Date: 202512122359
ISSUE DATE / TIME: 202511190821
Unit Type and Rh: 8400

## 2024-10-20 LAB — MAGNESIUM: Magnesium: 1.9 mg/dL (ref 1.7–2.4)

## 2024-10-20 LAB — PHOSPHORUS: Phosphorus: 2.5 mg/dL (ref 2.5–4.6)

## 2024-10-20 MED ORDER — SODIUM CHLORIDE 0.9% FLUSH
10.0000 mL | Freq: Two times a day (BID) | INTRAVENOUS | Status: DC
  Administered 2024-10-20 – 2024-10-21 (×2): 10 mL

## 2024-10-20 MED ORDER — SODIUM CHLORIDE 0.9% FLUSH
10.0000 mL | INTRAVENOUS | Status: DC | PRN
  Administered 2024-10-25 – 2024-10-27 (×2): 10 mL

## 2024-10-20 MED ORDER — POTASSIUM CHLORIDE CRYS ER 20 MEQ PO TBCR
40.0000 meq | EXTENDED_RELEASE_TABLET | ORAL | Status: AC
Start: 2024-10-20 — End: 2024-10-20
  Administered 2024-10-20 (×2): 40 meq via ORAL
  Filled 2024-10-20 (×2): qty 2

## 2024-10-20 NOTE — Progress Notes (Signed)

## 2024-10-20 NOTE — Progress Notes (Signed)
 Occupational Therapy Treatment Patient Details Name: Haley Randall MRN: [id number] DOB: 1936-07-02 Today's Date: 10/20/2024   History of present illness 88 y.o. female adm 10/13/24 with medical history significant of hypertension, hyperlipidemia, and gout presents with abdominal cramps.  Admitted for abdominal cramps.   OT comments  Patient appears to be very close to her functional baseline.  Generalized supervision/Mod I for ADL and in room mobility.  OT will likely discharge next session if she maintains her current functional status.  No post acute OT indicated.        If plan is discharge home, recommend the following:  Assist for transportation;Assistance with cooking/housework   Equipment Recommendations  None recommended by OT    Recommendations for Other Services      Precautions / Restrictions Precautions Precautions: Fall Recall of Precautions/Restrictions: Intact Restrictions Weight Bearing Restrictions Per Provider Order: No       Mobility Bed Mobility Overal bed mobility: Modified Independent                  Transfers Overall transfer level: Needs assistance Equipment used: Rollator (4 wheels) Transfers: Sit to/from Stand Sit to Stand: Modified independent (Device/Increase time)     Step pivot transfers: Supervision           Balance Overall balance assessment: Needs assistance Sitting-balance support: Feet supported Sitting balance-Leahy Scale: Normal     Standing balance support: Reliant on assistive device for balance Standing balance-Leahy Scale: Fair                             ADL either performed or assessed with clinical judgement   ADL       Grooming: Supervision/safety;Standing               Lower Body Dressing: Supervision/safety;Sit to/from stand   Toilet Transfer: Retail [employer]er;Ambulation;Rollator (4 wheels)                  Extremity/Trunk Assessment Upper Extremity  Assessment Upper Extremity Assessment: Overall WFL for tasks assessed   Lower Extremity Assessment Lower Extremity Assessment: Defer to PT evaluation   Cervical / Trunk Assessment Cervical / Trunk Assessment: Normal    Vision Patient Visual Report: No change from baseline     Perception Perception Perception: Not tested   Praxis Praxis Praxis: Not tested   Communication Communication Communication: No apparent difficulties   Cognition Arousal: Alert Behavior During Therapy: WFL for tasks assessed/performed Cognition: History of cognitive impairments             OT - Cognition Comments: ST Memory deficits.                 Following commands: Intact        Cueing   Cueing Techniques: Verbal cues  Exercises      Shoulder Instructions       General Comments      Pertinent Vitals/ Pain       Pain Assessment Pain Assessment: No/denies pain Pain Intervention(s): Monitored during session                                                          Frequency  Min 2X/week        Progress Toward Goals  OT Goals(current  goals can now be found in the care plan section)  Progress towards OT goals: Progressing toward goals  Acute Rehab OT Goals OT Goal Formulation: With patient Time For Goal Achievement: 10/28/24 Potential to Achieve Goals: Good  Plan      Co-evaluation                 AM-PAC OT 6 Clicks Daily Activity     Outcome Measure   Help from another person eating meals?: None Help from another person taking care of personal grooming?: None Help from another person toileting, which includes using toliet, bedpan, or urinal?: A Little Help from another person bathing (including washing, rinsing, drying)?: A Little Help from another person to put on and taking off regular upper body clothing?: None Help from another person to put on and taking off regular lower body clothing?: A Little 6 Click Score:  21    End of Session Equipment Utilized During Treatment: Rollator (4 wheels)  OT Visit Diagnosis: Unsteadiness on feet (R26.81)   Activity Tolerance Patient tolerated treatment well   Patient Left in chair;with call bell/phone within reach   Nurse Communication Mobility status        Time: 8986-8963 OT Time Calculation (min): 23 min  Charges: OT General Charges $OT Visit: 1 Visit OT Treatments $Self Care/Home Management : 23-37 mins  10/20/2024  RP, OTR/L  Acute Rehabilitation Services  Office:  [phone]   Haley Randall 10/20/2024, 10:40 AM

## 2024-10-20 NOTE — Plan of Care (Signed)

## 2024-10-20 NOTE — Progress Notes (Signed)
 PROGRESS NOTE        PATIENT DETAILS Name: Haley Randall Age: 88 y.o. Sex: female Date of Birth: Feb 16, 1936 Admit Date: 10/13/2024 Admitting Physician [name], MD ERE:Ilhjo, Ginger, FNP  Brief Summary: Patient is a 88 y.o.  female with history of HTN, HLD-who presented with abdominal pain-found to have pancreatitis/vague liver lesions on CT abdomen.  Significant events: 11/13>> admit to [facility]  Significant studies: 11/13>> CXR: No PNA 11/13>> CT abdomen/pelvis: 4.8 x 3.1 cm subtle lesion-right liver-additional small lesions measuring up to 2.1 cm.  Gallbladder distended.  Edema/inflammation along the descending ascending colon/hepatic flexure. 11/13>> RUQ ultrasound: Gallbladder wall upper normal to borderline thickened, no echogenic gallstones.  CBD 8 mm. 11/14>> MRI abdomen: Multicystic enhancing lesions in the liver-abscesses versus metastatic disease 10/17/2024.  CT-guided liver biopsy by IR scheduled.  Significant microbiology data: 11/13>> COVID/influenza/RSV PCR: Negative 11/13>> urine culture: Multiple species 11/13>> 1/2: Gram-positive cocci (prelim Staph epidermidis)-likely contamination. 11/14>> acute hepatitis serology: Negative 11/14>> RPR: Nonreactive     Consults: General surgery IR  Subjective:  Patient laying in bed.  No return of abdominal pain.  Still having some loose bowel movements and is not eating very much but no nausea or abdominal pain associated with eating.  Objective: Vitals: Blood pressure (!) 167/48, pulse 83, temperature 97.9 F (36.6 C), temperature source Oral, resp. rate 20, height 4' 11 (1.499 m), weight 52.3 kg, SpO2 96%.   Exam:  Awake Alert, No new F.N deficits, Normal affect [place].AT,PERRAL Supple Neck, No JVD,   Symmetrical Chest wall movement, Good air movement bilaterally, CTAB RRR,No Gallops, Rubs or new Murmurs,  +ve B.Sounds, Abd Soft, nontender No Cyanosis, Clubbing or edema      Assessment/Plan:  Acute pancreatitis No return of abdominal pain and tolerating regular diet although not eating much No gallstones seen on imaging studies-no history of EtOH use- ?Related to malignancy (liver lesion seen on imaging studies).  MRCP negative for choledocholithiasis-no major pancreatic abnormality on MRI. Tolerating regular diet  Acute metabolic encephalopathy Unclear etiology-likely acute on chronic cognitive impairment at baseline Resolved after admission and continues to be stable  Maintain delirium precautions  Transaminitis Hyperbilirubinemia Potentially due to mass effect from liver lesions-alternatively could could have passed a small stone Acute hepatitis serology negative Transaminases and bilirubin down from admission and now within normal limits MRCP as above S/p liver biopsy 11/17, pathology remains pending Trend LFTs Continue supportive care  Multicystic enhancing liver lesions on MRCP abdomen  Unclear whether this is abscess (does have colitis on CT imaging) versus metastatic disease Continue Zosyn  S/p liver mass biopsy 11/17, pathology pending, unable to get Gram stain and culture due to specimen being put in formalin  NAGMA Bicarb 19 this morning  Improved after stopping bicarb free fluids Continue to monitor   ? Colitis Discussed with GI, Dr. San, most likely more local inflammation from liver pathology rather than focal colitis With liver pathology still pending although unable to get Gram stain and culture we will continue antibiotics for now Continue Zosyn , day 8  HTN Systolic blood pressures remain elevated but diastolics remain low which has been a problem in the past No return of tachycardia after starting metoprolol  Hold off on resuming amlodipine  Trial TED stockings  Normocytic anemia No recent baseline but hemoglobin had been normal over 1 year ago Hemoglobin 11 on admission and dropped to  7.3 yesterday after steady  decline, no source of bleeding, s/p 1 unit PRBCs on 11/19 Posttransfusion H&H with hemoglobin of 10.6, morning labs pending Iron labs most consistent with anemia of chronic disease Continue ferrous sulfate  Hypomagnesemia Continue to monitor and replete as needed  Hypokalemia Continue to monitor and replete as needed  Hypophosphatemia Continue to monitor and replete as needed  Gram-positive bacteremia-coag negative staph (preliminary result)  likely a contaminant, 1 out of 2 blood cultures, other one showed no growth for completion  ?  Mild dementia/cognitive issues B12/RPR all stable Ammonia level is only minimally elevated-doubt of any clinical significance. No confusion this morning-continue to maintain delirium precautions  Elevated TSH TSH 5.8 with T4 1.04.  Likely sick euthyroid and will need repeat TSH in 4 to 6 weeks  Palliative care Previously had long discussion with the patient/daughter regarding findings on MRI that could represent metastatic disease.  Underwent liver biopsy 11/17 and awaiting results but if metastatic disease they seem to be leaning towards stopping further workup but will continue to discuss  Code status:   Code Status: Full Code   DVT Prophylaxis: Place TED hose Start: 10/19/24 0932 enoxaparin (LOVENOX) injection 30 mg Start: 10/18/24 1000 Place and maintain sequential compression device Start: 10/16/24 0843   Family Communication: Daughter [name] [phone]  called and updated over the phone on 10/17/2024  Disposition Plan: Status is: Observation The patient will require care spanning > 2 midnights and should be moved to inpatient because: Severity of illness   Planned Discharge Destination:Home   Diet: Diet Order             Diet regular Room service appropriate? Yes; Fluid consistency: Thin  Diet effective now                    MEDICATIONS: Scheduled Meds:  amLODipine   10 mg Oral Daily   enoxaparin (LOVENOX) injection   30 mg Subcutaneous Daily   feeding supplement (GLUCERNA SHAKE)  237 mL Oral TID BM   ferrous sulfate  325 mg Oral Q breakfast   folic acid  1 mg Oral Daily   metoprolol tartrate  50 mg Oral BID   pantoprazole (PROTONIX) IV  40 mg Intravenous Q12H   potassium chloride  40 mEq Oral Q4H   Continuous Infusions:  piperacillin-tazobactam (ZOSYN)  IV 3.375 g (10/20/24 0506)   PRN Meds:.albuterol, diltiazem, fentaNYL (SUBLIMAZE) injection, hydrALAZINE, ondansetron **OR** ondansetron (ZOFRAN) IV, oxyCODONE   I have personally reviewed following labs and imaging studies  LABORATORY DATA:   Data Review:   Inpatient Medications  Scheduled Meds:  amLODipine   10 mg Oral Daily   enoxaparin (LOVENOX) injection  30 mg Subcutaneous Daily   feeding supplement (GLUCERNA SHAKE)  237 mL Oral TID BM   ferrous sulfate  325 mg Oral Q breakfast   folic acid  1 mg Oral Daily   metoprolol tartrate  50 mg Oral BID   pantoprazole (PROTONIX) IV  40 mg Intravenous Q12H   potassium chloride  40 mEq Oral Q4H   Continuous Infusions:  piperacillin-tazobactam (ZOSYN)  IV 3.375 g (10/20/24 0506)   PRN Meds:.albuterol, diltiazem, fentaNYL (SUBLIMAZE) injection, hydrALAZINE, ondansetron **OR** ondansetron (ZOFRAN) IV, oxyCODONE  DVT Prophylaxis  Place TED hose Start: 10/19/24 0932 enoxaparin (LOVENOX) injection 30 mg Start: 10/18/24 1000 Place and maintain sequential compression device Start: 10/16/24 0843   Recent Labs  Lab 10/15/24 0344 10/16/24 0627 10/17/24 0655 10/18/24 0640 10/19/24 0347 10/19/24 1402  WBC 11.4* 8.5 8.0  9.5 8.1  --   HGB 9.1* 8.6* 8.2* 8.1* 7.3* 10.6*  HCT 28.7* 27.3* 25.2* 25.3* 22.7* 31.6*  PLT 121* 125* 153 176 155  --   MCV 80.8 82.5 80.3 80.1 79.9*  --   MCH 25.6* 26.0 26.1 25.6* 25.7*  --   MCHC 31.7 31.5 32.5 32.0 32.2  --   RDW 16.0* 16.4* 16.3* 16.3* 16.3*  --   LYMPHSABS  --  0.4* 0.4* 0.7 0.7  --   MONOABS  --  0.2 0.6 0.7 0.8  --   EOSABS  --  0.0 0.1 0.1 0.1   --   BASOSABS  --  0.0 0.0 0.0 0.0  --     Recent Labs  Lab 10/13/24 1756 10/14/24 0237 10/14/24 1614 10/15/24 0344 10/16/24 0627 10/17/24 0404 10/17/24 0655 10/18/24 0343 10/19/24 0347  NA  --    < >  --  137 137  --  137 136 138  K  --    < >  --  3.2* 3.8  --  3.3* 4.2 2.9*  CL  --    < >  --  110 108  --  108 109 107  CO2  --    < >  --  18* 17*  --  19* 16* 19*  ANIONGAP  --    < >  --  9 12  --  10 11 12   GLUCOSE  --    < >  --  117* 92  --  108* 109* 107*  BUN  --    < >  --  19 17  --  16 13 11   CREATININE  --    < >  --  1.57* 1.45*  --  1.26* 1.43* 1.39*  AST  --    < >  --  135* 61*  --  40 49* 30  ALT  --    < >  --  134* 83*  --  58* 60* 39  ALKPHOS  --    < >  --  50 57  --  61 67 62  BILITOT  --    < >  --  2.4* 2.4*  --  1.7* 1.6* 0.9  ALBUMIN  --    < >  --  2.4* 2.2*  --  2.0* 2.4* 2.0*  INR  --   --   --   --   --  1.1  --   --   --   TSH  --   --  1.241  --   --   --   --  5.880*  --   AMMONIA 37*  --   --   --   --   --   --   --   --   MG  --    < >  --  2.7* 2.2  --  2.0 1.9 1.6*  PHOS  --   --   --   --   --   --   --  1.3* 2.8  CALCIUM   --    < >  --  8.2* 8.6*  --  8.5* 9.2 8.6*   < > = values in this interval not displayed.      Recent Labs  Lab 10/13/24 1756 10/14/24 0237 10/14/24 1614 10/15/24 0344 10/16/24 9372 10/17/24 0404 10/17/24 0655 10/18/24 0343 10/19/24 0347  INR  --   --   --   --   --  1.1  --   --   --   TSH  --   --  1.241  --   --   --   --  5.880*  --   AMMONIA 37*  --   --   --   --   --   --   --   --   MG  --    < >  --  2.7* 2.2  --  2.0 1.9 1.6*  CALCIUM   --    < >  --  8.2* 8.6*  --  8.5* 9.2 8.6*   < > = values in this interval not displayed.    --------------------------------------------------------------------------------------------------------------- Lab Results  Component Value Date   CHOL 157 09/01/2023   HDL 86 09/01/2023   LDLCALC 57 09/01/2023   TRIG 74 09/01/2023   CHOLHDL 1.8 09/01/2023     Lab Results  Component Value Date   HGBA1C 6.2 10/02/2022   Recent Labs    10/18/24 0343  TSH 5.880*  FREET4 1.04   Recent Labs    10/18/24 0640  VITAMINB12 826  FERRITIN 190  TIBC 288  IRON 33    ------------------------------------------------------------------------------------------------------------------ Cardiac Enzymes No results for input(s): CKMB, TROPONINI, MYOGLOBIN in the last 168 hours.  Invalid input(s): CK  Micro Results Recent Results (from the past 240 hours)  Blood Culture (routine x 2)     Status: None   Collection Time: 10/13/24  5:17 AM   Specimen: BLOOD  Result Value Ref Range Status   Specimen Description BLOOD BLOOD LEFT ARM  Final   Special Requests   Final    BOTTLES DRAWN AEROBIC AND ANAEROBIC Blood Culture adequate volume   Culture   Final    NO GROWTH 5 DAYS Performed at [facility], [address] [place], [place] [address]    Report Status 10/18/2024 FINAL  Final  Blood Culture (routine x 2)     Status: Abnormal   Collection Time: 10/13/24  5:17 AM   Specimen: BLOOD  Result Value Ref Range Status   Specimen Description BLOOD BLOOD LEFT HAND  Final   Special Requests   Final    BOTTLES DRAWN AEROBIC ONLY Blood Culture results may not be optimal due to an inadequate volume of blood received in culture bottles   Culture  Setup Time   Final    GRAM POSITIVE COCCI IN CLUSTERS AEROBIC BOTTLE ONLY CRITICAL RESULT CALLED TO, READ BACK BY AND VERIFIED WITH: PHARMD Jeremy on [id number] @1010  by SM    Culture (A)  Final    STAPHYLOCOCCUS EPIDERMIDIS THE SIGNIFICANCE OF ISOLATING THIS ORGANISM FROM A SINGLE SET OF BLOOD CULTURES WHEN MULTIPLE SETS ARE DRAWN IS UNCERTAIN. PLEASE NOTIFY THE MICROBIOLOGY DEPARTMENT WITHIN ONE WEEK IF SPECIATION AND SENSITIVITIES ARE REQUIRED. Performed at [facility], [address], [place], [place] [address]    Report Status 10/15/2024 FINAL  Final  Blood Culture ID Panel (Reflexed)      Status: Abnormal   Collection Time: 10/13/24  5:17 AM  Result Value Ref Range Status   Enterococcus faecalis NOT DETECTED NOT DETECTED Final   Enterococcus Faecium NOT DETECTED NOT DETECTED Final   Listeria monocytogenes NOT DETECTED NOT DETECTED Final   Staphylococcus species DETECTED (A) NOT DETECTED Final    Comment: CRITICAL RESULT CALLED TO, READ BACK BY AND VERIFIED WITH: MAYA Ditch on [id number] @1010  by SM    Staphylococcus aureus (BCID) NOT DETECTED NOT DETECTED Final   Staphylococcus  epidermidis DETECTED (A) NOT DETECTED Final    Comment: CRITICAL RESULT CALLED TO, READ BACK BY AND VERIFIED WITH: PHARMD Jeremy on [phone] @1010  by SM    Staphylococcus lugdunensis NOT DETECTED NOT DETECTED Final   Streptococcus species NOT DETECTED NOT DETECTED Final   Streptococcus agalactiae NOT DETECTED NOT DETECTED Final   Streptococcus pneumoniae NOT DETECTED NOT DETECTED Final   Streptococcus pyogenes NOT DETECTED NOT DETECTED Final   A.calcoaceticus-baumannii NOT DETECTED NOT DETECTED Final   Bacteroides fragilis NOT DETECTED NOT DETECTED Final   Enterobacterales NOT DETECTED NOT DETECTED Final   Enterobacter cloacae complex NOT DETECTED NOT DETECTED Final   Escherichia coli NOT DETECTED NOT DETECTED Final   Klebsiella aerogenes NOT DETECTED NOT DETECTED Final   Klebsiella oxytoca NOT DETECTED NOT DETECTED Final   Klebsiella pneumoniae NOT DETECTED NOT DETECTED Final   Proteus species NOT DETECTED NOT DETECTED Final   Salmonella species NOT DETECTED NOT DETECTED Final   Serratia marcescens NOT DETECTED NOT DETECTED Final   Haemophilus influenzae NOT DETECTED NOT DETECTED Final   Neisseria meningitidis NOT DETECTED NOT DETECTED Final   Pseudomonas aeruginosa NOT DETECTED NOT DETECTED Final   Stenotrophomonas maltophilia NOT DETECTED NOT DETECTED Final   Candida albicans NOT DETECTED NOT DETECTED Final   Candida auris NOT DETECTED NOT DETECTED Final   Candida glabrata NOT DETECTED  NOT DETECTED Final   Candida krusei NOT DETECTED NOT DETECTED Final   Candida parapsilosis NOT DETECTED NOT DETECTED Final   Candida tropicalis NOT DETECTED NOT DETECTED Final   Cryptococcus neoformans/gattii NOT DETECTED NOT DETECTED Final   Methicillin resistance mecA/C NOT DETECTED NOT DETECTED Final    Comment: Performed at [facility], [address] [place], [place] [address]  Resp panel by RT-PCR (RSV, Flu A&B, Covid) Anterior Nasal Swab     Status: None   Collection Time: 10/13/24  7:14 AM   Specimen: Anterior Nasal Swab  Result Value Ref Range Status   SARS Coronavirus 2 by RT PCR NEGATIVE NEGATIVE Final   Influenza A by PCR NEGATIVE NEGATIVE Final   Influenza B by PCR NEGATIVE NEGATIVE Final    Comment: (NOTE) The Xpert Xpress SARS-CoV-2/FLU/RSV plus assay is intended as an aid in the diagnosis of influenza from Nasopharyngeal swab specimens and should not be used as a sole basis for treatment. Nasal washings and aspirates are unacceptable for Xpert Xpress SARS-CoV-2/FLU/RSV testing.  Fact Sheet for Patients: bloggercourse.com  Fact Sheet for Healthcare Providers: seriousbroker.it  This test is not yet approved or cleared by the [place]  FDA and has been authorized for detection and/or diagnosis of SARS-CoV-2 by FDA under an Emergency Use Authorization (EUA). This EUA will remain in effect (meaning this test can be used) for the duration of the COVID-19 declaration under Section 564(b)(1) of the Act, 21 U.S.C. section 360bbb-3(b)(1), unless the authorization is terminated or revoked.     Resp Syncytial Virus by PCR NEGATIVE NEGATIVE Final    Comment: (NOTE) Fact Sheet for Patients: bloggercourse.com  Fact Sheet for Healthcare Providers: seriousbroker.it  This test is not yet approved or cleared by the [place]  FDA and has been authorized for  detection and/or diagnosis of SARS-CoV-2 by FDA under an Emergency Use Authorization (EUA). This EUA will remain in effect (meaning this test can be used) for the duration of the COVID-19 declaration under Section 564(b)(1) of the Act, 21 U.S.C. section 360bbb-3(b)(1), unless the authorization is terminated or revoked.  Performed at [facility], [address], [place], [place] [address]   Urine Culture (for pregnant, neutropenic or urologic patients or patients with an indwelling urinary catheter)     Status: Abnormal   Collection Time: 10/13/24  9:25 AM   Specimen: Urine, Clean Catch  Result Value Ref Range Status   Specimen Description URINE, CLEAN CATCH  Final   Special Requests   Final    NONE Performed at [facility], [address] [place], [place] [address]    Culture MULTIPLE SPECIES PRESENT, SUGGEST RECOLLECTION (A)  Final   Report Status 10/14/2024 FINAL  Final    Radiology Reports  No results found.      Signature  -    Fairy Pool, DO Internal Medicine Resident, PGY-3 8:52 AM 10/20/2024   To contact the attending provider between 7A-7P or the covering provider during after hours 7P-7A, please log into the web site [email or web address] and access using universal Loop password for that web site. If you do not have the password, please call the hospital operator.

## 2024-10-21 DIAGNOSIS — R109 Unspecified abdominal pain: Secondary | ICD-10-CM | POA: Diagnosis not present

## 2024-10-21 LAB — COMPREHENSIVE METABOLIC PANEL WITH GFR
ALT: 28 U/L (ref 0–44)
AST: 27 U/L (ref 15–41)
Albumin: 2.2 g/dL — ABNORMAL LOW (ref 3.5–5.0)
Alkaline Phosphatase: 80 U/L (ref 38–126)
Anion gap: 14 (ref 5–15)
BUN: 10 mg/dL (ref 8–23)
CO2: 16 mmol/L — ABNORMAL LOW (ref 22–32)
Calcium: 9.4 mg/dL (ref 8.9–10.3)
Chloride: 105 mmol/L (ref 98–111)
Creatinine, Ser: 1.38 mg/dL — ABNORMAL HIGH (ref 0.44–1.00)
GFR, Estimated: 37 mL/min — ABNORMAL LOW (ref >60)
Glucose, Bld: 79 mg/dL (ref 70–99)
Potassium: 4.4 mmol/L (ref 3.5–5.1)
Sodium: 135 mmol/L (ref 135–145)
Total Bilirubin: 1.6 mg/dL — ABNORMAL HIGH (ref 0.0–1.2)
Total Protein: 5.7 g/dL — ABNORMAL LOW (ref 6.5–8.1)

## 2024-10-21 LAB — CBC WITH DIFFERENTIAL/PLATELET
Abs Immature Granulocytes: 0.44 K/uL — ABNORMAL HIGH (ref 0.00–0.07)
Basophils Absolute: 0.1 K/uL (ref 0.0–0.1)
Basophils Relative: 0 %
Eosinophils Absolute: 0 K/uL (ref 0.0–0.5)
Eosinophils Relative: 0 %
HCT: 29.9 % — ABNORMAL LOW (ref 36.0–46.0)
Hemoglobin: 9.6 g/dL — ABNORMAL LOW (ref 12.0–15.0)
Immature Granulocytes: 3 %
Lymphocytes Relative: 6 %
Lymphs Abs: 0.9 K/uL (ref 0.7–4.0)
MCH: 27 pg (ref 26.0–34.0)
MCHC: 32.1 g/dL (ref 30.0–36.0)
MCV: 84.2 fL (ref 80.0–100.0)
Monocytes Absolute: 1.1 K/uL — ABNORMAL HIGH (ref 0.1–1.0)
Monocytes Relative: 8 %
Neutro Abs: 11.8 K/uL — ABNORMAL HIGH (ref 1.7–7.7)
Neutrophils Relative %: 83 %
Platelets: 220 K/uL (ref 150–400)
RBC: 3.55 MIL/uL — ABNORMAL LOW (ref 3.87–5.11)
RDW: 17 % — ABNORMAL HIGH (ref 11.5–15.5)
WBC: 14.4 K/uL — ABNORMAL HIGH (ref 4.0–10.5)
nRBC: 0.2 % (ref 0.0–0.2)

## 2024-10-21 LAB — SURGICAL PATHOLOGY

## 2024-10-21 LAB — MAGNESIUM: Magnesium: 1.9 mg/dL (ref 1.7–2.4)

## 2024-10-21 LAB — PHOSPHORUS: Phosphorus: 2.1 mg/dL — ABNORMAL LOW (ref 2.5–4.6)

## 2024-10-21 MED ORDER — ADULT MULTIVITAMIN W/MINERALS CH
1.0000 | ORAL_TABLET | Freq: Every day | ORAL | Status: DC
  Administered 2024-10-22 – 2024-10-31 (×10): 1 via ORAL
  Filled 2024-10-21 (×10): qty 1

## 2024-10-21 MED ORDER — K PHOS MONO-SOD PHOS DI & MONO 155-852-130 MG PO TABS
500.0000 mg | ORAL_TABLET | Freq: Once | ORAL | Status: AC
  Administered 2024-10-21: 500 mg via ORAL
  Filled 2024-10-21: qty 2

## 2024-10-21 NOTE — Progress Notes (Signed)
 PROGRESS NOTE        PATIENT DETAILS Name: Haley Randall Age: 88 y.o. Sex: female Date of Birth: 1936/10/18 Admit Date: 10/13/2024 Admitting Physician [name], MD ERE:Ilhjo, Ginger, FNP  Brief Summary: Patient is a 88 y.o.  female with history of HTN, HLD-who presented with abdominal pain-found to have pancreatitis/vague liver lesions on CT abdomen.  Significant events: 11/13>> admit to [facility]  Significant studies: 11/13>> CXR: No PNA 11/13>> CT abdomen/pelvis: 4.8 x 3.1 cm subtle lesion-right liver-additional small lesions measuring up to 2.1 cm.  Gallbladder distended.  Edema/inflammation along the descending ascending colon/hepatic flexure. 11/13>> RUQ ultrasound: Gallbladder wall upper normal to borderline thickened, no echogenic gallstones.  CBD 8 mm. 11/14>> MRI abdomen: Multicystic enhancing lesions in the liver-abscesses versus metastatic disease 10/17/2024.  CT-guided liver biopsy by IR scheduled.  Significant microbiology data: 11/13>> COVID/influenza/RSV PCR: Negative 11/13>> urine culture: Multiple species 11/13>> 1/2: Gram-positive cocci (prelim Staph epidermidis)-likely contamination. 11/14>> acute hepatitis serology: Negative 11/14>> RPR: Nonreactive     Consults: General surgery IR  Subjective:  Patient laying in bed.  No return of abdominal pain or any other new or worsening issues.   Objective: Vitals: Blood pressure (!) 164/55, pulse (!) 59, temperature 98 F (36.7 C), temperature source Axillary, resp. rate (!) 27, height 4' 11 (1.499 m), weight 52.3 kg, SpO2 96%.   Exam:  Awake Alert, No new F.N deficits, Normal affect [place].AT,PERRAL Supple Neck, No JVD,   Symmetrical Chest wall movement, Good air movement bilaterally, CTAB RRR,No Gallops, Rubs or new Murmurs,  +ve B.Sounds, Abd Soft, nontender No Cyanosis, Clubbing or edema   Assessment/Plan:  Acute pancreatitis Stable for several days without return of  abdominal pain and tolerating regular diet without issue No gallstones seen on imaging studies-no history of EtOH use- ?Related to malignancy (liver lesion seen on imaging studies).  MRCP negative for choledocholithiasis-no major pancreatic abnormality on MRI.  Acute metabolic encephalopathy Unclear etiology-likely acute on chronic cognitive impairment at baseline Resolved after admission and continues to be stable  Maintain delirium precautions  Transaminitis Hyperbilirubinemia Potentially due to mass effect from liver lesions-alternatively could could have passed a small stone Acute hepatitis serology negative Transaminases remain within normal limits and bilirubin fluctuating between mildly elevated and normal MRCP as above Trend LFTs Continue supportive care  Multicystic enhancing liver lesions on MRCP abdomen  Unclear whether this is abscess (does have colitis on CT imaging) versus metastatic disease S/p liver biopsy 11/17, unable to get Gram stain and culture due to specimen being put in formalin, pathology remains pending, discussed with pathology today who needed to run extra test and could have result today Continue Zosyn  until pathology results, day 9  NAGMA Prerenal AKI on CKD stage IIIa/b Bicarb continues to fluctuate between 16 and 19 Likely 2/2 renal dysfunction, which has been stable at baseline over the past few days after initial AKI Continue to monitor   ? Colitis Discussed with GI, Dr. San, most likely more local inflammation from liver pathology rather than focal colitis With liver pathology still pending although unable to get Gram stain and culture we will continue antibiotics for now Continue Zosyn , day 9  HTN Systolic blood pressures remain elevated but diastolics remain low which has been a problem in the past No return of tachycardia after starting metoprolol  Hold off on resuming amlodipine  Trial TED stockings, issues  with orthostatic hypotension  in the past  Normocytic anemia No recent baseline but hemoglobin had been normal over 1 year ago Hemoglobin 11 on admission and dropped to 7.3 on 11/19, s/p 1 unit PRBCs on 11/19 Hemoglobin stable at 9.6 this morning Iron labs most consistent with anemia of chronic disease Continue ferrous sulfate   Hypomagnesemia Continue to monitor and replete as needed  Hypokalemia Continue to monitor and replete as needed  Hypophosphatemia Continue to monitor and replete as needed  Gram-positive bacteremia-coag negative staph (preliminary result)  likely a contaminant, 1 out of 2 blood cultures, other one showed no growth for completion  ?  Mild dementia/cognitive issues B12/RPR all stable Ammonia level is only minimally elevated-doubt of any clinical significance. No confusion this morning-continue to maintain delirium precautions  Elevated TSH TSH 5.8 with T4 1.04.  Likely sick euthyroid and will need repeat TSH in 4 to 6 weeks  Palliative care Previously had long discussion with the patient/daughter regarding findings on MRI that could represent metastatic disease.  Underwent liver biopsy 11/17 and awaiting results but if metastatic disease they seem to be leaning towards stopping further workup but will continue to discuss  Code status:   Code Status: Full Code   DVT Prophylaxis: Place TED hose Start: 10/19/24 0932 enoxaparin  (LOVENOX ) injection 30 mg Start: 10/18/24 1000 Place and maintain sequential compression device Start: 10/16/24 0843   Family Communication: Daughter [name] [phone]  called and updated over the phone on 10/17/2024  Disposition Plan: Status is: Observation The patient will require care spanning > 2 midnights and should be moved to inpatient because: Severity of illness   Planned Discharge Destination:Home   Diet: Diet Order             Diet regular Room service appropriate? Yes; Fluid consistency: Thin  Diet effective now                     MEDICATIONS: Scheduled Meds:  amLODipine   10 mg Oral Daily   enoxaparin  (LOVENOX ) injection  30 mg Subcutaneous Daily   feeding supplement (GLUCERNA SHAKE)  237 mL Oral TID BM   ferrous sulfate   325 mg Oral Q breakfast   folic acid   1 mg Oral Daily   metoprolol  tartrate  50 mg Oral BID   pantoprazole  (PROTONIX ) IV  40 mg Intravenous Q12H   phosphorus  500 mg Oral Once   sodium chloride  flush  10-40 mL Intracatheter Q12H   Continuous Infusions:  piperacillin -tazobactam (ZOSYN )  IV 3.375 g (10/21/24 0522)   PRN Meds:.albuterol , diltiazem , fentaNYL  (SUBLIMAZE ) injection, hydrALAZINE , ondansetron  **OR** ondansetron  (ZOFRAN ) IV, oxyCODONE , sodium chloride  flush   I have personally reviewed following labs and imaging studies  LABORATORY DATA:   Data Review:   Inpatient Medications  Scheduled Meds:  amLODipine   10 mg Oral Daily   enoxaparin  (LOVENOX ) injection  30 mg Subcutaneous Daily   feeding supplement (GLUCERNA SHAKE)  237 mL Oral TID BM   ferrous sulfate   325 mg Oral Q breakfast   folic acid   1 mg Oral Daily   metoprolol  tartrate  50 mg Oral BID   pantoprazole  (PROTONIX ) IV  40 mg Intravenous Q12H   phosphorus  500 mg Oral Once   sodium chloride  flush  10-40 mL Intracatheter Q12H   Continuous Infusions:  piperacillin -tazobactam (ZOSYN )  IV 3.375 g (10/21/24 0522)   PRN Meds:.albuterol , diltiazem , fentaNYL  (SUBLIMAZE ) injection, hydrALAZINE , ondansetron  **OR** ondansetron  (ZOFRAN ) IV, oxyCODONE , sodium chloride  flush  DVT Prophylaxis  Place TED hose  Start: 10/19/24 0932 enoxaparin  (LOVENOX ) injection 30 mg Start: 10/18/24 1000 Place and maintain sequential compression device Start: 10/16/24 0843   Recent Labs  Lab 10/17/24 0655 10/18/24 0640 10/19/24 0347 10/19/24 1402 10/20/24 0900 10/21/24 0255  WBC 8.0 9.5 8.1  --  13.2* 14.4*  HGB 8.2* 8.1* 7.3* 10.6* 10.5* 9.6*  HCT 25.2* 25.3* 22.7* 31.6* 32.2* 29.9*  PLT 153 176 155  --  221 220  MCV 80.3 80.1  79.9*  --  82.1 84.2  MCH 26.1 25.6* 25.7*  --  26.8 27.0  MCHC 32.5 32.0 32.2  --  32.6 32.1  RDW 16.3* 16.3* 16.3*  --  16.4* 17.0*  LYMPHSABS 0.4* 0.7 0.7  --  0.8 0.9  MONOABS 0.6 0.7 0.8  --  1.0 1.1*  EOSABS 0.1 0.1 0.1  --  0.0 0.0  BASOSABS 0.0 0.0 0.0  --  0.1 0.1    Recent Labs  Lab 10/14/24 1614 10/15/24 0344 10/17/24 0404 10/17/24 0655 10/18/24 0343 10/19/24 0347 10/20/24 0900 10/21/24 0255  NA  --    < >  --  137 136 138 136 135  K  --    < >  --  3.3* 4.2 2.9* 3.4* 4.4  CL  --    < >  --  108 109 107 102 105  CO2  --    < >  --  19* 16* 19* 18* 16*  ANIONGAP  --    < >  --  10 11 12  16* 14  GLUCOSE  --    < >  --  108* 109* 107* 81 79  BUN  --    < >  --  16 13 11 9 10   CREATININE  --    < >  --  1.26* 1.43* 1.39* 1.34* 1.38*  AST  --    < >  --  40 49* 30 27 27   ALT  --    < >  --  58* 60* 39 34 28  ALKPHOS  --    < >  --  61 67 62 71 80  BILITOT  --    < >  --  1.7* 1.6* 0.9 1.5* 1.6*  ALBUMIN  --    < >  --  2.0* 2.4* 2.0* 2.3* 2.2*  INR  --   --  1.1  --   --   --   --   --   TSH 1.241  --   --   --  5.880*  --   --   --   MG  --    < >  --  2.0 1.9 1.6* 1.9 1.9  PHOS  --   --   --   --  1.3* 2.8 2.5 2.1*  CALCIUM   --    < >  --  8.5* 9.2 8.6* 9.3 9.4   < > = values in this interval not displayed.      Recent Labs  Lab 10/14/24 1614 10/15/24 0344 10/17/24 0404 10/17/24 0655 10/18/24 0343 10/19/24 0347 10/20/24 0900 10/21/24 0255  INR  --   --  1.1  --   --   --   --   --   TSH 1.241  --   --   --  5.880*  --   --   --   MG  --    < >  --  2.0 1.9 1.6* 1.9 1.9  CALCIUM   --    < >  --  8.5* 9.2 8.6* 9.3 9.4   < > = values in this interval not displayed.    --------------------------------------------------------------------------------------------------------------- Lab Results  Component Value Date   CHOL 157 09/01/2023   HDL 86 09/01/2023   LDLCALC 57 09/01/2023   TRIG 74 09/01/2023   CHOLHDL 1.8 09/01/2023    Lab Results   Component Value Date   HGBA1C 6.2 10/02/2022   No results for input(s): TSH, T4TOTAL, FREET4, T3FREE, THYROIDAB in the last 72 hours.  No results for input(s): VITAMINB12, FOLATE, FERRITIN, TIBC, IRON, RETICCTPCT in the last 72 hours.   ------------------------------------------------------------------------------------------------------------------ Cardiac Enzymes No results for input(s): CKMB, TROPONINI, MYOGLOBIN in the last 168 hours.  Invalid input(s): CK  Micro Results Recent Results (from the past 240 hours)  Blood Culture (routine x 2)     Status: None   Collection Time: 10/13/24  5:17 AM   Specimen: BLOOD  Result Value Ref Range Status   Specimen Description BLOOD BLOOD LEFT ARM  Final   Special Requests   Final    BOTTLES DRAWN AEROBIC AND ANAEROBIC Blood Culture adequate volume   Culture   Final    NO GROWTH 5 DAYS Performed at [facility], [address] [place], [place] [address]    Report Status 10/18/2024 FINAL  Final  Blood Culture (routine x 2)     Status: Abnormal   Collection Time: 10/13/24  5:17 AM   Specimen: BLOOD  Result Value Ref Range Status   Specimen Description BLOOD BLOOD LEFT HAND  Final   Special Requests   Final    BOTTLES DRAWN AEROBIC ONLY Blood Culture results may not be optimal due to an inadequate volume of blood received in culture bottles   Culture  Setup Time   Final    GRAM POSITIVE COCCI IN CLUSTERS AEROBIC BOTTLE ONLY CRITICAL RESULT CALLED TO, READ BACK BY AND VERIFIED WITH: MAYA Ditch on [phone] @1010  by SM    Culture (A)  Final    STAPHYLOCOCCUS EPIDERMIDIS THE SIGNIFICANCE OF ISOLATING THIS ORGANISM FROM A SINGLE SET OF BLOOD CULTURES WHEN MULTIPLE SETS ARE DRAWN IS UNCERTAIN. PLEASE NOTIFY THE MICROBIOLOGY DEPARTMENT WITHIN ONE WEEK IF SPECIATION AND SENSITIVITIES ARE REQUIRED. Performed at [facility], [address], [place], [place] [address]    Report Status 10/15/2024  FINAL  Final  Blood Culture ID Panel (Reflexed)     Status: Abnormal   Collection Time: 10/13/24  5:17 AM  Result Value Ref Range Status   Enterococcus faecalis NOT DETECTED NOT DETECTED Final   Enterococcus Faecium NOT DETECTED NOT DETECTED Final   Listeria monocytogenes NOT DETECTED NOT DETECTED Final   Staphylococcus species DETECTED (A) NOT DETECTED Final    Comment: CRITICAL RESULT CALLED TO, READ BACK BY AND VERIFIED WITH: PHARMD Jeremy on [phone] @1010  by SM    Staphylococcus aureus (BCID) NOT DETECTED NOT DETECTED Final   Staphylococcus epidermidis DETECTED (A) NOT DETECTED Final    Comment: CRITICAL RESULT CALLED TO, READ BACK BY AND VERIFIED WITH: PHARMD Jeremy on [phone] @1010  by SM    Staphylococcus lugdunensis NOT DETECTED NOT DETECTED Final   Streptococcus species NOT DETECTED NOT DETECTED Final   Streptococcus agalactiae NOT DETECTED NOT DETECTED Final   Streptococcus pneumoniae NOT DETECTED NOT DETECTED Final   Streptococcus pyogenes NOT DETECTED NOT DETECTED Final   A.calcoaceticus-baumannii NOT DETECTED NOT DETECTED Final   Bacteroides fragilis NOT DETECTED NOT DETECTED Final   Enterobacterales NOT DETECTED NOT DETECTED Final   Enterobacter cloacae  complex NOT DETECTED NOT DETECTED Final   Escherichia coli NOT DETECTED NOT DETECTED Final   Klebsiella aerogenes NOT DETECTED NOT DETECTED Final   Klebsiella oxytoca NOT DETECTED NOT DETECTED Final   Klebsiella pneumoniae NOT DETECTED NOT DETECTED Final   Proteus species NOT DETECTED NOT DETECTED Final   Salmonella species NOT DETECTED NOT DETECTED Final   Serratia marcescens NOT DETECTED NOT DETECTED Final   Haemophilus influenzae NOT DETECTED NOT DETECTED Final   Neisseria meningitidis NOT DETECTED NOT DETECTED Final   Pseudomonas aeruginosa NOT DETECTED NOT DETECTED Final   Stenotrophomonas maltophilia NOT DETECTED NOT DETECTED Final   Candida albicans NOT DETECTED NOT DETECTED Final   Candida auris NOT DETECTED NOT  DETECTED Final   Candida glabrata NOT DETECTED NOT DETECTED Final   Candida krusei NOT DETECTED NOT DETECTED Final   Candida parapsilosis NOT DETECTED NOT DETECTED Final   Candida tropicalis NOT DETECTED NOT DETECTED Final   Cryptococcus neoformans/gattii NOT DETECTED NOT DETECTED Final   Methicillin resistance mecA/C NOT DETECTED NOT DETECTED Final    Comment: Performed at [facility], [address] [place], [place] [address]  Resp panel by RT-PCR (RSV, Flu A&B, Covid) Anterior Nasal Swab     Status: None   Collection Time: 10/13/24  7:14 AM   Specimen: Anterior Nasal Swab  Result Value Ref Range Status   SARS Coronavirus 2 by RT PCR NEGATIVE NEGATIVE Final   Influenza A by PCR NEGATIVE NEGATIVE Final   Influenza B by PCR NEGATIVE NEGATIVE Final    Comment: (NOTE) The Xpert Xpress SARS-CoV-2/FLU/RSV plus assay is intended as an aid in the diagnosis of influenza from Nasopharyngeal swab specimens and should not be used as a sole basis for treatment. Nasal washings and aspirates are unacceptable for Xpert Xpress SARS-CoV-2/FLU/RSV testing.  Fact Sheet for Patients: bloggercourse.com  Fact Sheet for Healthcare Providers: seriousbroker.it  This test is not yet approved or cleared by the [place]  FDA and has been authorized for detection and/or diagnosis of SARS-CoV-2 by FDA under an Emergency Use Authorization (EUA). This EUA will remain in effect (meaning this test can be used) for the duration of the COVID-19 declaration under Section 564(b)(1) of the Act, 21 U.S.C. section 360bbb-3(b)(1), unless the authorization is terminated or revoked.     Resp Syncytial Virus by PCR NEGATIVE NEGATIVE Final    Comment: (NOTE) Fact Sheet for Patients: bloggercourse.com  Fact Sheet for Healthcare Providers: seriousbroker.it  This test is not yet approved or cleared by the  [place]  FDA and has been authorized for detection and/or diagnosis of SARS-CoV-2 by FDA under an Emergency Use Authorization (EUA). This EUA will remain in effect (meaning this test can be used) for the duration of the COVID-19 declaration under Section 564(b)(1) of the Act, 21 U.S.C. section 360bbb-3(b)(1), unless the authorization is terminated or revoked.  Performed at [facility], [address], [place], [place] [address]   Urine Culture (for pregnant, neutropenic or urologic patients or patients with an indwelling urinary catheter)     Status: Abnormal   Collection Time: 10/13/24  9:25 AM   Specimen: Urine, Clean Catch  Result Value Ref Range Status   Specimen Description URINE, CLEAN CATCH  Final   Special Requests   Final    NONE Performed at [facility], [address] [place], [place] [address]    Culture MULTIPLE SPECIES PRESENT, SUGGEST RECOLLECTION (A)  Final   Report Status 10/14/2024 FINAL  Final  Radiology Reports  No results found.      Signature  -    Fairy Pool, DO Internal Medicine Resident, PGY-3 8:27 AM 10/21/2024   To contact the attending provider between 7A-7P or the covering provider during after hours 7P-7A, please log into the web site [email or web address] and access using universal [place] password for that web site. If you do not have the password, please call the hospital operator.

## 2024-10-21 NOTE — Progress Notes (Addendum)
   10/21/24 1119  Mobility  Activity Ambulated with assistance  Level of Assistance Contact guard assist, steadying assist  Assistive Device Other (Comment) (Countertop/ Furniture)  Distance Ambulated (ft) 10 ft  Activity Response Tolerated fair  Mobility Referral Yes  Mobility visit 1 Mobility  Mobility Specialist Start Time (ACUTE ONLY) 1119  Mobility Specialist Stop Time (ACUTE ONLY) 1127  Mobility Specialist Time Calculation (min) (ACUTE ONLY) 8 min   Mobility Specialist: Progress Note  Pt agreeable to mobility session - requesting to use the BR - received in bed. Pt was asymptomatic throughout session with no complaints. Returned to [facility] with all needs met - call bell within reach. Left with PT present.   Additional comments: Pt seen for additional session. C/o nose bleed from earlier has tissue in her R nostril, otherwise no complaints. Left in bed with bed alarm on.   Virgle Boards, BS Mobility Specialist Please contact via SecureChat or  Rehab office at [phone].

## 2024-10-21 NOTE — Plan of Care (Signed)

## 2024-10-21 NOTE — Progress Notes (Signed)
 Initial Nutrition Assessment  DOCUMENTATION CODES:   Non-severe (moderate) malnutrition in context of acute illness/injury  INTERVENTION:   Snacks TID between meals to augment calorie/protein intake  Continue liberalized diet to maximize PO intake  -Assistance w/ meal ordering to avoid missed meals  Add MVI w/ minerals  New weight collection to assess trend   NUTRITION DIAGNOSIS:  Moderate Malnutrition related to acute illness as evidenced by mild muscle depletion, mild fat depletion.  GOAL:  Patient will meet greater than or equal to 90% of their needs   MONITOR:  PO intake, Skin, Labs, Supplement acceptance  REASON FOR ASSESSMENT:  Malnutrition Screening Tool   ASSESSMENT:  Pt with PMH significant for: HTN, CKD III, HLD. Presented with abdominal pain and found to have pancreatitis and lever lesions on CT of abdomen.  11/13 - admitted, clear liquid diet, CT abdomen: 4.8 x 3.1 cm lesion to right side of liver w/ additional small lesions noted, distended gallbladder, edema/inflammation along descending/ascending colon/hepatic flexure 11/14 - MRI abdomen: multicystic enhancing lesions in liver - abscesses vs mets 11/15 - full liquid diet 11/17 - liver biopsy: pathology still pending 11/18 - regular diet  11/19 - 1 unit PRBCs transfused  Liver biopsy completed with pathology still pending. Diet advanced and patient is tolerating. Has been stable for several days. Seeing patient r/t positive MST score. Met with patient at bedside today who reports reduced appetite since admission.  Average Meal Intake 11/17: 0% x2 documented offerings 11/19: 0-50% x2 documented offerings  Reports adequate intake PTA, however questionable as she appears to have refed. States she just isn't hungry. She also does not prefer the Glucerna or Boost Breeze. Will d/c and order snacks TID, which she is amicable to, in an effort to augment calorie/protein intake.  24 Hour Recall B: oatmeal or  scrambled eggs w/ hot tea L: cheeseburger or hamburger from fast food establishment w/ tea or Coke D: protein, starch, veg w/ tea of Coke  Discussed importance of maintaining intake to prevent further loss of lean body mass. She verbalizes understanding. No difficulties reported with chewing or swallowing. She does endorse loose stools, which are likely r/t ABX use.   Admit/Current Weight: 52.3 kg  No new weight x1 weeks since admission. Will request new one to assess weight trend while inpatient. Per chart review, current weight is as high as patient has been in last two years. No new weight in last year, however noted with 10.6% weight gain in last year. Not considered significant, however will request another weight to make sure trend is accurate. Per patient, her UBW is around 130lbs, but she has lost down to her current body weight in last few weeks. This is 11.5% weight loss and considered clinically significant for the time frame reviewed. Some mild cognitive impairment noted this admission, but seems alert today. Questionable historian, so will rely on NFPE to assess nutrition status.   Drains/Lines: R basilic: midline, single lumen UOP: 250 ml x12 hours  Meds: ferrous sulfate , folic acid , pantoprazole , K PHOS  x1, IV ABX  Low phosphorus today. Being repleted. Has required potassium, phosphorus, and magnesium  supplementation while admitted.   Labs:  NA+ 135 (wdl) K+ 4.4 (wdl) PHOS 2.1 (L) Mg 1.6>1.9>1.9 (wdl) WBC 8.1>13.2>14.4 (H) TSH 5.880 (H) T4 1.04 (wdl) CBGs 79-81 x24 hours    NUTRITION - FOCUSED PHYSICAL EXAM: While she meets criteria for malnutrition in the context of acute illness, she likely also meets  in the setting of chronic illness (CKD III)  and social/environmental (advanced age) setting as well. All are likely contributors to her current nutrition status and place her at risk for further nutrition-related decline.  Flowsheet Row Most Recent Value  Orbital  Region Mild depletion  Upper Arm Region Moderate depletion  Thoracic and Lumbar Region No depletion  Buccal Region No depletion  Temple Region Mild depletion  Clavicle Bone Region Mild depletion  Clavicle and Acromion Bone Region No depletion  Scapular Bone Region Mild depletion  Dorsal Hand No depletion  Patellar Region Mild depletion  Anterior Thigh Region Mild depletion  Posterior Calf Region Mild depletion  Edema (RD Assessment) None  Hair Reviewed  Eyes Reviewed  Mouth Reviewed  Skin Reviewed  Nails Reviewed    Diet Order:   Diet Order             Diet regular Room service appropriate? Yes; Fluid consistency: Thin  Diet effective now            EDUCATION NEEDS:   Education needs have been addressed  Skin:  Skin Assessment: Reviewed RN Assessment  Last BM:  11/20 - type 6 x2  Height:  Ht Readings from Last 1 Encounters:  10/14/24 4' 11 (1.499 m)   Weight:  Wt Readings from Last 1 Encounters:  10/14/24 52.3 kg   Ideal Body Weight:  43.2 kg  BMI:  Body mass index is 23.29 kg/m.  Estimated Nutritional Needs:   Kcal:  1300-1500  Protein:  60-75g  Fluid:  1.3-1.5L/day  Blair Deaner MS, RD, LDN Registered Dietitian Clinical Nutrition RD Inpatient Contact Info in Amion

## 2024-10-21 NOTE — Progress Notes (Signed)
 Physical Therapy Treatment Patient Details Name: Haley Randall MRN: [id number] DOB: Jul 23, 1936 Today's Date: 10/21/2024   History of Present Illness 88 y.o. female adm 10/13/24 with medical history significant of hypertension, hyperlipidemia, and gout presents with abdominal cramps.  Admitted for abdominal cramps.    PT Comments  Pt tolerated treatment well today. Pt able to ambulate in hallway with rollator at supervision level and navigate stairs with CGA. Pt still requiring education and cues for locking brakes on rollator. No change in DC/DME recs at this time. PT will continue to follow.     If plan is discharge home, recommend the following: A little help with walking and/or transfers;A little help with bathing/dressing/bathroom;Assistance with cooking/housework;Help with stairs or ramp for entrance;Assist for transportation   Can travel by private Psychologist, Clinical (4 wheels)    Recommendations for Other Services       Precautions / Restrictions Precautions Precautions: Fall Recall of Precautions/Restrictions: Intact Restrictions Weight Bearing Restrictions Per Provider Order: No     Mobility  Bed Mobility               General bed mobility comments: Up in bathroom    Transfers Overall transfer level: Modified independent Equipment used: None Transfers: Sit to/from Stand Sit to Stand: Modified independent (Device/Increase time)           General transfer comment: Able to stand and perform pericare in bathroom independently.    Ambulation/Gait Ambulation/Gait assistance: Supervision Gait Distance (Feet): 200 Feet Assistive device: Rollator (4 wheels) Gait Pattern/deviations: Narrow base of support, Scissoring, Decreased stride length, Step-through pattern Gait velocity: decreased Gait velocity interpretation: 1.31 - 2.62 ft/sec, indicative of limited community ambulator   General Gait Details: Pt required education on  rollator brake management again. Overall steady with improved gait speed compared to previous session.   Stairs Stairs: Yes Stairs assistance: Contact guard assist Stair Management: Alternating pattern, Sideways, One rail Left, One rail Right, Forwards Number of Stairs: 3 General stair comments: no LOB noted. Very cautious with pt navigating stairs sideways. Limited by IV line.   Wheelchair Mobility     Tilt Bed    Modified Rankin (Stroke Patients Only)       Balance Overall balance assessment: Needs assistance Sitting-balance support: Feet supported Sitting balance-Leahy Scale: Normal     Standing balance support: Reliant on assistive device for balance Standing balance-Leahy Scale: Fair                              Hotel Manager: No apparent difficulties  Cognition Arousal: Alert Behavior During Therapy: WFL for tasks assessed/performed   PT - Cognitive impairments: No apparent impairments                         Following commands: Intact      Cueing Cueing Techniques: Verbal cues  Exercises      General Comments General comments (skin integrity, edema, etc.): VSS      Pertinent Vitals/Pain Pain Assessment Pain Assessment: No/denies pain    Home Living                          Prior Function            PT Goals (current goals can now be found in the care plan section) Progress towards PT goals:  Progressing toward goals    Frequency    Min 2X/week      PT Plan      Co-evaluation              AM-PAC PT 6 Clicks Mobility   Outcome Measure  Help needed turning from your back to your side while in a flat bed without using bedrails?: A Little Help needed moving from lying on your back to sitting on the side of a flat bed without using bedrails?: A Little Help needed moving to and from a bed to a chair (including a wheelchair)?: A Little Help needed standing up from a  chair using your arms (e.g., wheelchair or bedside chair)?: A Little Help needed to walk in hospital room?: A Little Help needed climbing 3-5 steps with a railing? : A Little 6 Click Score: 18    End of Session Equipment Utilized During Treatment: Gait belt Activity Tolerance: Patient tolerated treatment well Patient left: in chair;with call bell/phone within reach;with chair alarm set Nurse Communication: Mobility status PT Visit Diagnosis: Other abnormalities of gait and mobility (R26.89)     Time: 8876-8863 PT Time Calculation (min) (ACUTE ONLY): 13 min  Charges:    $Gait Training: 8-22 mins PT General Charges $$ ACUTE PT VISIT: 1 Visit                     Sueellen NOVAK, PT, DPT Acute Rehab Services [phone]    Rahiem Schellinger 10/21/2024, 11:59 AM

## 2024-10-22 ENCOUNTER — Inpatient Hospital Stay (HOSPITAL_COMMUNITY)

## 2024-10-22 DIAGNOSIS — R109 Unspecified abdominal pain: Secondary | ICD-10-CM | POA: Diagnosis not present

## 2024-10-22 DIAGNOSIS — R188 Other ascites: Secondary | ICD-10-CM | POA: Diagnosis not present

## 2024-10-22 DIAGNOSIS — K828 Other specified diseases of gallbladder: Secondary | ICD-10-CM | POA: Diagnosis not present

## 2024-10-22 DIAGNOSIS — R918 Other nonspecific abnormal finding of lung field: Secondary | ICD-10-CM | POA: Diagnosis not present

## 2024-10-22 DIAGNOSIS — N281 Cyst of kidney, acquired: Secondary | ICD-10-CM | POA: Diagnosis not present

## 2024-10-22 LAB — CBC WITH DIFFERENTIAL/PLATELET
Abs Immature Granulocytes: 0.27 K/uL — ABNORMAL HIGH (ref 0.00–0.07)
Basophils Absolute: 0 K/uL (ref 0.0–0.1)
Basophils Relative: 0 %
Eosinophils Absolute: 0.1 K/uL (ref 0.0–0.5)
Eosinophils Relative: 0 %
HCT: 27.8 % — ABNORMAL LOW (ref 36.0–46.0)
Hemoglobin: 9 g/dL — ABNORMAL LOW (ref 12.0–15.0)
Immature Granulocytes: 2 %
Lymphocytes Relative: 5 %
Lymphs Abs: 0.7 K/uL (ref 0.7–4.0)
MCH: 26.9 pg (ref 26.0–34.0)
MCHC: 32.4 g/dL (ref 30.0–36.0)
MCV: 83 fL (ref 80.0–100.0)
Monocytes Absolute: 0.9 K/uL (ref 0.1–1.0)
Monocytes Relative: 7 %
Neutro Abs: 11 K/uL — ABNORMAL HIGH (ref 1.7–7.7)
Neutrophils Relative %: 86 %
Platelets: 234 K/uL (ref 150–400)
RBC: 3.35 MIL/uL — ABNORMAL LOW (ref 3.87–5.11)
RDW: 17.2 % — ABNORMAL HIGH (ref 11.5–15.5)
WBC: 12.9 K/uL — ABNORMAL HIGH (ref 4.0–10.5)
nRBC: 0.2 % (ref 0.0–0.2)

## 2024-10-22 LAB — COMPREHENSIVE METABOLIC PANEL WITH GFR
ALT: 24 U/L (ref 0–44)
AST: 24 U/L (ref 15–41)
Albumin: 2 g/dL — ABNORMAL LOW (ref 3.5–5.0)
Alkaline Phosphatase: 57 U/L (ref 38–126)
Anion gap: 17 — ABNORMAL HIGH (ref 5–15)
BUN: 7 mg/dL — ABNORMAL LOW (ref 8–23)
CO2: 17 mmol/L — ABNORMAL LOW (ref 22–32)
Calcium: 9.5 mg/dL (ref 8.9–10.3)
Chloride: 106 mmol/L (ref 98–111)
Creatinine, Ser: 1.53 mg/dL — ABNORMAL HIGH (ref 0.44–1.00)
GFR, Estimated: 33 mL/min — ABNORMAL LOW (ref >60)
Glucose, Bld: 82 mg/dL (ref 70–99)
Potassium: 3.6 mmol/L (ref 3.5–5.1)
Sodium: 140 mmol/L (ref 135–145)
Total Bilirubin: 1.5 mg/dL — ABNORMAL HIGH (ref 0.0–1.2)
Total Protein: 5.4 g/dL — ABNORMAL LOW (ref 6.5–8.1)

## 2024-10-22 LAB — PROCALCITONIN: Procalcitonin: 0.47 ng/mL

## 2024-10-22 MED ORDER — LORAZEPAM 2 MG/ML IJ SOLN: 1.0000 mg | Freq: Once | INTRAMUSCULAR | Status: DC | PRN

## 2024-10-22 MED ORDER — GADOBUTROL 1 MMOL/ML IV SOLN
6.0000 mL | Freq: Once | INTRAVENOUS | Status: AC | PRN
  Administered 2024-10-22: 6 mL via INTRAVENOUS

## 2024-10-22 MED ORDER — LACTATED RINGERS IV SOLN: INTRAVENOUS | Status: AC

## 2024-10-22 MED ORDER — ACETAMINOPHEN 325 MG PO TABS
650.0000 mg | ORAL_TABLET | Freq: Four times a day (QID) | ORAL | Status: DC | PRN
  Administered 2024-10-27: 650 mg via ORAL
  Filled 2024-10-22: qty 2

## 2024-10-22 NOTE — Plan of Care (Signed)
   Problem: Clinical Measurements: Goal: Will remain free from infection Outcome: Progressing Goal: Diagnostic test results will improve Outcome: Progressing   Problem: Nutrition: Goal: Adequate nutrition will be maintained Outcome: Progressing

## 2024-10-22 NOTE — Progress Notes (Signed)
 PROGRESS NOTE        PATIENT DETAILS Name: Haley Randall Age: 88 y.o. Sex: female Date of Birth: 1936-02-03 Admit Date: 10/13/2024 Admitting Physician [name], MD ERE:Ilhjo, Ginger, FNP  Brief Summary: Patient is a 88 y.o.  female with history of HTN, HLD-who presented with abdominal pain-found to have pancreatitis/vague liver lesions on CT abdomen.  Significant events: 11/13>> admit to [facility]  Significant studies: 11/13>> CXR: No PNA 11/13>> CT abdomen/pelvis: 4.8 x 3.1 cm subtle lesion-right liver-additional small lesions measuring up to 2.1 cm.  Gallbladder distended.  Edema/inflammation along the descending ascending colon/hepatic flexure. 11/13>> RUQ ultrasound: Gallbladder wall upper normal to borderline thickened, no echogenic gallstones.  CBD 8 mm. 11/14>> MRI abdomen: Multicystic enhancing lesions in the liver-abscesses versus metastatic disease 10/17/2024.  CT-guided liver biopsy by IR scheduled.  Significant microbiology data: 11/13>> COVID/influenza/RSV PCR: Negative 11/13>> urine culture: Multiple species 11/13>> 1/2: Gram-positive cocci (prelim Staph epidermidis)-likely contamination. 11/14>> acute hepatitis serology: Negative 11/14>> RPR: Nonreactive     Consults: General surgery IR  Subjective:  Patient laying in bed.  No return of abdominal pain or any other new or worsening issues.   Objective: Vitals: Blood pressure 139/62, pulse 85, temperature 97.9 F (36.6 C), temperature source Oral, resp. rate 19, height 4' 11 (1.499 m), weight 58.2 kg, SpO2 96%.   Exam:  Awake Alert, No new F.N deficits, Normal affect [place].AT,PERRAL Supple Neck, No JVD,   Symmetrical Chest wall movement, Good air movement bilaterally, CTAB RRR,No Gallops, Rubs or new Murmurs,  +ve B.Sounds, Abd Soft, nontender No Cyanosis, Clubbing or edema   Assessment/Plan:  Acute pancreatitis with asymptomatic transaminitis and mildly elevated bilirubin  levels With supportive care acute pancreatitis completely resolved, no gallstones on imaging, no history of alcohol use, MRCP unremarkable in terms of no choledocholithiasis-no major pancreatic abnormality on MRI.  Transaminitis - Hyperbilirubinemia Potentially due to mass effect from liver lesions-alternatively could could have passed a small stone Acute hepatitis serology negative MRI liver/MRCP as above, LFT trend has improved minimal elevation in total bilirubin only at this time.  Remains symptom-free.  Multicystic enhancing liver lesions on MRCP abdomen, biopsy not consistent with malignancy Clinically initially was unclear whether this is abscess (does have colitis on CT imaging) versus metastatic disease S/p liver biopsy 11/17, unable to get Gram stain and culture due to specimen being put in formalin, pathology not show any malignancy.  On Zosyn  and now completely symptom-free, blood cultures 1 out of 2 suggestive of contamination, clinically much improved and symptom-free, clinically more suggestive of an abscess coming from the colon, repeating MRI liver on 10/22/2024.  ? Colitis Discussed with GI, Dr. San, most likely more local inflammation from liver pathology rather than focal colitis   Gram-positive bacteremia-coag negative staph (preliminary result)  likely a contaminant, 1 out of 2 blood cultures, other one showed no growth for completion  HTN Systolic blood pressures remain elevated but diastolics remain low which has been a problem in the past No return of tachycardia after starting metoprolol  Hold off on resuming amlodipine  Trial TED stockings, issues with orthostatic hypotension in the past  NAGMA Prerenal AKI on CKD stage IIIa/b Bicarb continues to fluctuate between 16 and 19 Likely 2/2 renal dysfunction, which has been stable at baseline over the past few days after initial AKI Continue to monitor   Normocytic anemia No recent  baseline but hemoglobin had  been normal over 1 year ago Hemoglobin 11 on admission and dropped to 7.3 on 11/19, s/p 1 unit PRBCs on 11/19 Hemoglobin stable at 9.6 this morning Iron labs most consistent with anemia of chronic disease Continue ferrous sulfate   Hypomagnesemia, hypokalemia, hypophosphatemia Continue to monitor and replete as needed  ?  Mild dementia/cognitive issues B12/RPR all stable Ammonia level is only minimally elevated-doubt of any clinical significance. No confusion this morning-continue to maintain delirium precautions  Elevated TSH TSH 5.8 with T4 1.04.  Likely sick euthyroid and will need repeat TSH in 4 to 6 weeks  Palliative care Previously had long discussion with the patient/daughter regarding findings on MRI that could represent metastatic disease.  Underwent liver biopsy 11/17 and awaiting results but if metastatic disease they seem to be leaning towards stopping further workup but will continue to discuss  Code status:   Code Status: Full Code   DVT Prophylaxis: Place TED hose Start: 10/19/24 0932 enoxaparin  (LOVENOX ) injection 30 mg Start: 10/18/24 1000 Place and maintain sequential compression device Start: 10/16/24 0843   Family Communication: Daughter [name] [phone]  called and updated over the phone on 10/17/2024  Disposition Plan: Status is: Observation The patient will require care spanning > 2 midnights and should be moved to inpatient because: Severity of illness   Planned Discharge Destination:Home   Diet: Diet Order             Diet regular Room service appropriate? Yes with Assist; Fluid consistency: Thin  Diet effective now                    MEDICATIONS: Scheduled Meds:  amLODipine   10 mg Oral Daily   enoxaparin  (LOVENOX ) injection  30 mg Subcutaneous Daily   ferrous sulfate   325 mg Oral Q breakfast   folic acid   1 mg Oral Daily   metoprolol  tartrate  50 mg Oral BID   multivitamin with minerals  1 tablet Oral Daily   pantoprazole  (PROTONIX )  IV  40 mg Intravenous Q12H   Continuous Infusions:  lactated ringers      piperacillin -tazobactam (ZOSYN )  IV 3.375 g (10/22/24 0529)   PRN Meds:.acetaminophen , albuterol , diltiazem , fentaNYL  (SUBLIMAZE ) injection, hydrALAZINE , LORazepam , ondansetron  **OR** ondansetron  (ZOFRAN ) IV, sodium chloride  flush   I have personally reviewed following labs and imaging studies  LABORATORY DATA:   Data Review:   Inpatient Medications  Scheduled Meds:  amLODipine   10 mg Oral Daily   enoxaparin  (LOVENOX ) injection  30 mg Subcutaneous Daily   ferrous sulfate   325 mg Oral Q breakfast   folic acid   1 mg Oral Daily   metoprolol  tartrate  50 mg Oral BID   multivitamin with minerals  1 tablet Oral Daily   pantoprazole  (PROTONIX ) IV  40 mg Intravenous Q12H   Continuous Infusions:  lactated ringers      piperacillin -tazobactam (ZOSYN )  IV 3.375 g (10/22/24 0529)   PRN Meds:.acetaminophen , albuterol , diltiazem , fentaNYL  (SUBLIMAZE ) injection, hydrALAZINE , LORazepam , ondansetron  **OR** ondansetron  (ZOFRAN ) IV, sodium chloride  flush  DVT Prophylaxis  Place TED hose Start: 10/19/24 0932 enoxaparin  (LOVENOX ) injection 30 mg Start: 10/18/24 1000 Place and maintain sequential compression device Start: 10/16/24 0843   Recent Labs  Lab 10/18/24 0640 10/19/24 0347 10/19/24 1402 10/20/24 0900 10/21/24 0255 10/22/24 0320  WBC 9.5 8.1  --  13.2* 14.4* 12.9*  HGB 8.1* 7.3* 10.6* 10.5* 9.6* 9.0*  HCT 25.3* 22.7* 31.6* 32.2* 29.9* 27.8*  PLT 176 155  --  221 220 234  MCV 80.1 79.9*  --  82.1 84.2 83.0  MCH 25.6* 25.7*  --  26.8 27.0 26.9  MCHC 32.0 32.2  --  32.6 32.1 32.4  RDW 16.3* 16.3*  --  16.4* 17.0* 17.2*  LYMPHSABS 0.7 0.7  --  0.8 0.9 0.7  MONOABS 0.7 0.8  --  1.0 1.1* 0.9  EOSABS 0.1 0.1  --  0.0 0.0 0.1  BASOSABS 0.0 0.0  --  0.1 0.1 0.0    Recent Labs  Lab 10/17/24 0404 10/17/24 0655 10/18/24 0343 10/19/24 0347 10/20/24 0900 10/21/24 0255 10/22/24 0320  NA  --  137 136 138  136 135 140  K  --  3.3* 4.2 2.9* 3.4* 4.4 3.6  CL  --  108 109 107 102 105 106  CO2  --  19* 16* 19* 18* 16* 17*  ANIONGAP  --  10 11 12  16* 14 17*  GLUCOSE  --  108* 109* 107* 81 79 82  BUN  --  16 13 11 9 10  7*  CREATININE  --  1.26* 1.43* 1.39* 1.34* 1.38* 1.53*  AST  --  40 49* 30 27 27 24   ALT  --  58* 60* 39 34 28 24  ALKPHOS  --  61 67 62 71 80 57  BILITOT  --  1.7* 1.6* 0.9 1.5* 1.6* 1.5*  ALBUMIN  --  2.0* 2.4* 2.0* 2.3* 2.2* 2.0*  PROCALCITON  --   --   --   --   --   --  0.47  INR 1.1  --   --   --   --   --   --   TSH  --   --  5.880*  --   --   --   --   MG  --  2.0 1.9 1.6* 1.9 1.9  --   PHOS  --   --  1.3* 2.8 2.5 2.1*  --   CALCIUM   --  8.5* 9.2 8.6* 9.3 9.4 9.5      Recent Labs  Lab 10/17/24 0404 10/17/24 0655 10/18/24 0343 10/19/24 0347 10/20/24 0900 10/21/24 0255 10/22/24 0320  PROCALCITON  --   --   --   --   --   --  0.47  INR 1.1  --   --   --   --   --   --   TSH  --   --  5.880*  --   --   --   --   MG  --  2.0 1.9 1.6* 1.9 1.9  --   CALCIUM   --  8.5* 9.2 8.6* 9.3 9.4 9.5    --------------------------------------------------------------------------------------------------------------- Lab Results  Component Value Date   CHOL 157 09/01/2023   HDL 86 09/01/2023   LDLCALC 57 09/01/2023   TRIG 74 09/01/2023   CHOLHDL 1.8 09/01/2023    Lab Results  Component Value Date   HGBA1C 6.2 10/02/2022   Radiology Reports  No results found.   Signature  -    Lavada Stank M.D on 10/22/2024 at 9:53 AM   -  To page go to [email or web address]

## 2024-10-22 NOTE — Progress Notes (Signed)
 Mob  10/22/24 1235  Mobility  Activity Ambulated with assistance  Level of Assistance Standby assist, set-up cues, supervision of patient - no hands on  Assistive Device Four wheel walker  Distance Ambulated (ft) 400 ft  Activity Response Tolerated well  Mobility Referral Yes  Mobility visit 1 Mobility  Mobility Specialist Start Time (ACUTE ONLY) 1235  Mobility Specialist Stop Time (ACUTE ONLY) 1256  Mobility Specialist Time Calculation (min) (ACUTE ONLY) 21 min   Pt agreeable to mobility. Required little to no physical assistance during ambulation, SV for safety. VSS throughout and no c/o when asked. Requested to use BR once returned to room, BM successful. Pt returned back to bed and left with all needs met, alarm on.   Lauraine Erm Mobility Specialist Please contact via SecureChat or [employer] [phone]

## 2024-10-23 DIAGNOSIS — K651 Peritoneal abscess: Secondary | ICD-10-CM

## 2024-10-23 DIAGNOSIS — R109 Unspecified abdominal pain: Secondary | ICD-10-CM | POA: Diagnosis not present

## 2024-10-23 DIAGNOSIS — K828 Other specified diseases of gallbladder: Secondary | ICD-10-CM | POA: Diagnosis not present

## 2024-10-23 MED ORDER — PANTOPRAZOLE SODIUM 40 MG PO TBEC
40.0000 mg | DELAYED_RELEASE_TABLET | Freq: Two times a day (BID) | ORAL | Status: DC
  Administered 2024-10-23 – 2024-10-31 (×16): 40 mg via ORAL
  Filled 2024-10-23 (×16): qty 1

## 2024-10-23 MED ORDER — ENSURE PLUS HIGH PROTEIN PO LIQD: 237.0000 mL | Freq: Two times a day (BID) | ORAL | Status: DC

## 2024-10-23 NOTE — Progress Notes (Signed)
 Request to IR for aspiration/possible drain placement of RUQ fluid collection +/- percutaneous cholecystostomy placement. HIDA ordered per GI.  Patient history and imaging reviewed by IR attending Dr. Philip who notes RUQ fluid collection is likely amenable to aspiration, may be too small for drain placement - will need to await results of HIDA before we can determine if patient will also need a percutaneous cholecystostomy.  Plan: - NPO at midnight for possible RUQ fluid aspiration +/- percutaneous cholecystostomy tube placement depending on results of HIDA  - Last dose of Lovenox  today at 0844, will hold Lovenox  on 11/24  - CBC/INR AM 11/24 - IR APP will review HIDA results with IR attending on 11/24 to determine plan and see patient for consult/consent following this  Please call on call IR physician with questions or concerns.  Clotilda Hesselbach, PA-C

## 2024-10-23 NOTE — Consult Note (Addendum)
 Consultation  Referring Provider: [facility]/ Dennise Primary Care Physician:  Corwin Antu, FNP Primary Gastroenterologist:  unassigned  Reason for Consultation: Abdominal pain, abnormal imaging of liver and gallbladder  HPI: Haley Randall is a 88 y.o. female with history of hypertension, hyperlipidemia, chronic kidney disease and gout who was admitted 10 days ago with complaints of abdominal pain and poor oral intake but no vomiting. Patient is very pleasant but somewhat limited historian who says today that she remembers having abdominal pain which was pretty severe, for couple days prior to admission.  She was feeling considerably better currently though still has some discomfort in her abdomen.  Appetite is not great but she is tolerating food. She has not had any prior abdominal surgery.  Initially on presentation she was a bit hypothermic and blood pressure was soft. Initial labs show WBC of 17.4/hemoglobin 10.8 BUN 18/creatinine 1.4 Lipase 2800 T. bili 1.7/AST 293/ALT 102 Lactate 2.7 on admission Chest x-ray negative UA was positive Imaging with CT of the abdomen and pelvis without contrast showed a 4.8 x 3.1 cm lesion in the right lobe of the liver and additional smaller lesions up to 2.1 cm, features indeterminant and MRI recommended to rule out evidence of metastatic disease.  Gallbladder distended with ill-defined gallbladder wall thickening no calcified gallstones seen, there was edema and fluid in the gallbladder fossa and hepatoduodenal ligament nonspecific but could be seen in setting of acute cholecystitis also had edema and inflammation along the distal ascending colon and hepatic flexure, and a small volume of free fluid question poor portal edema.  Ultrasound-on admit with gallbladder wall borderline thickened at 3.1 mm no echogenic stones seen cholecystitis not excluded as pericholecystic fluid was noted.  CBD 8 mm  Blood cultures were done, she was started on IV  Zosyn  Cultures positive only for Staph epidermidis  Was seen and evaluated by surgery who recommended MRI  MRI on 11/14-showed right pleural effusion, peripheral multicystic enhancing lesions in the liver with 1 lesion 3.6 x 3 cm in the hepatic dome and a lateral right hepatic lesion measuring 6.3 x 3.8 cm, mild Perry portal edema no significant ductal dilation pancreas appears normal, duodenum appears edematous with wall thickening and does not cross the midline as expected there is edema and inflammation in the right upper quadrant along the duodenum gallbladder fossa and hepatic flexure progressive since the previous CT also noted extensive edema in the retroperitoneal anatomy of the right abdomen extending down towards the pelvis  IR was consulted and she underwent biopsy of one of the right liver lesions on 10/17/2024. Biopsies took several days to return but returned showing reactive nonspecific changes no evidence of malignancy  Repeat MRI 10/22/2024-there is progressive complex fluid accumulation in the right hemiabdomen extending from the gallbladder into the iliac fossa on the right measuring 8.2 x 6.9 x 20 cm or some signs of early loculation and also new gallbladder wall edema with Abran cholecystic fluid no gallstones and persistent areas of hyperemia surrounding the multiple cystic areas in the dome of the liver which bray represent multiple dilated biliary radicles in setting of cholangitis interval increase in right pleural effusion now moderate  Labs yesterday WBC 12.9/hemoglobin 9/hematocrit 27.8 Potassium 3.6/BUN 7/creatinine 1.53 albumin 2 T. bili 1.5 otherwise LFTs unremarkable  procalcitonin within normal limits  Patient has been afebrile, remains on IV Zosyn    Past Medical History:  Diagnosis Date   Chest pain    Dizziness    Hyperlipidemia  Hypertension    Idiopathic gout of foot 08/30/2020    Past Surgical History:  Procedure Laterality Date   BREAST SURGERY  Left 1980   biopsy    Prior to Admission medications   Not on File    Current Facility-Administered Medications  Medication Dose Route Frequency Provider Last Rate Last Admin   acetaminophen  (TYLENOL ) tablet 650 mg  650 mg Oral Q6H PRN Singh, Prashant K, MD       albuterol  (PROVENTIL ) (2.5 MG/3ML) 0.083% nebulizer solution 2.5 mg  2.5 mg Nebulization Q6H PRN Claudene Reeves A, MD       amLODipine  (NORVASC ) tablet 10 mg  10 mg Oral Daily Singh, Prashant K, MD   10 mg at 10/22/24 0919   diltiazem  (CARDIZEM ) injection 10 mg  10 mg Intravenous Q6H PRN Singh, Prashant K, MD       enoxaparin  (LOVENOX ) injection 30 mg  30 mg Subcutaneous Daily Reome, Earle J, RPH   30 mg at 10/22/24 9075   fentaNYL  (SUBLIMAZE ) injection 25 mcg  25 mcg Intravenous Q2H PRN Claudene Reeves LABOR, MD       ferrous sulfate  tablet 325 mg  325 mg Oral Q breakfast Jolaine Pac, DO   325 mg at 10/22/24 0919   folic acid  (FOLVITE ) tablet 1 mg  1 mg Oral Daily Jolaine Pac, DO   1 mg at 10/22/24 9080   hydrALAZINE  (APRESOLINE ) injection 10 mg  10 mg Intravenous Q6H PRN Singh, Prashant K, MD   10 mg at 10/21/24 0836   LORazepam  (ATIVAN ) injection 1 mg  1 mg Intravenous Once PRN Singh, Prashant K, MD       metoprolol  tartrate (LOPRESSOR ) tablet 50 mg  50 mg Oral BID Singh, Prashant K, MD   50 mg at 10/22/24 2100   multivitamin with minerals tablet 1 tablet  1 tablet Oral Daily Singh, Prashant K, MD   1 tablet at 10/22/24 0919   ondansetron  (ZOFRAN ) tablet 4 mg  4 mg Oral Q6H PRN Claudene Reeves LABOR, MD       Or   ondansetron  (ZOFRAN ) injection 4 mg  4 mg Intravenous Q6H PRN Claudene Reeves A, MD       pantoprazole  (PROTONIX ) injection 40 mg  40 mg Intravenous Q12H Raenelle Donalda HERO, MD   40 mg at 10/22/24 2059   piperacillin -tazobactam (ZOSYN ) IVPB 3.375 g  3.375 g Intravenous Q8H Hindman, Katherine G, RPH 12.5 mL/hr at 10/23/24 0551 3.375 g at 10/23/24 0551   sodium chloride  flush (NS) 0.9 % injection 10-40 mL  10-40 mL  Intracatheter PRN Dennise Lavada POUR, MD        Allergies as of 10/13/2024 - Review Complete 10/13/2024  Allergen Reaction Noted   Lisinopril  Other (See Comments) 06/24/2022    Family History  Problem Relation Age of Onset   Heart disease Mother    Heart disease Father    Stroke Maternal Grandmother    Stroke Maternal Grandfather    Heart disease Paternal Grandmother    Heart disease Paternal Grandfather    Cancer Neg Hx    Diabetes Neg Hx     Social History   Socioeconomic History   Marital status: Married    Spouse name: Fannie   Number of children: 1   Years of education: Not on file   Highest education level: Not on file  Occupational History   Occupation: retired  Tobacco Use   Smoking status: Former   Smokeless tobacco: Never   Tobacco comments:  quit 30 years ago  Vaping Use   Vaping status: Never Used  Substance and Sexual Activity   Alcohol use: Not Currently   Drug use: No   Sexual activity: Not Currently  Other Topics Concern   Not on file  Social History Narrative   05/23/22   From: originally from [place], then [place]    Living: with husband education officer, environmental) and grandson   Work: retired - book keeping      Family: one adopted daughter - [name]      Enjoys: nothing currently      Exercise: not currently   Diet: avoids certain foods - seafood, red meat      Safety   Seat belts: Yes    Guns: No   Safe in relationships: Yes       Social Drivers of Corporate Investment Banker Strain: Low Risk  (08/24/2023)   Overall Financial Resource Strain (CARDIA)    Difficulty of Paying Living Expenses: Not hard at all  Food Insecurity: No Food Insecurity (10/13/2024)   Hunger Vital Sign    Worried About Running Out of Food in the Last Year: Never true    Ran Out of Food in the Last Year: Never true  Transportation Needs: No Transportation Needs (10/13/2024)   PRAPARE - Administrator, Civil Service (Medical): No    Lack of Transportation (Non-Medical): No   Physical Activity: Inactive (08/24/2023)   Exercise Vital Sign    Days of Exercise per Week: 0 days    Minutes of Exercise per Session: 0 min  Stress: No Stress Concern Present (08/24/2023)   [employer] of Occupational Health - Occupational Stress Questionnaire    Feeling of Stress : Not at all  Social Connections: Socially Isolated (10/13/2024)   Social Connection and Isolation Panel    Frequency of Communication with Friends and Family: Once a week    Frequency of Social Gatherings with Friends and Family: Once a week    Attends Religious Services: Never    Database Administrator or Organizations: No    Attends Banker Meetings: Never    Marital Status: Widowed  Intimate Partner Violence: Not At Risk (10/13/2024)   Humiliation, Afraid, Rape, and Kick questionnaire    Fear of Current or Ex-Partner: No    Emotionally Abused: No    Physically Abused: No    Sexually Abused: No    Review of Systems: Pertinent positive and negative review of systems were noted in the above HPI section.  All other review of systems was otherwise negative.  Physical Exam: Vital signs in last 24 hours: Temp:  [98 F (36.7 C)-99 F (37.2 C)] 99 F (37.2 C) (11/23 0723) Pulse Rate:  [70-99] 98 (11/23 0723) Resp:  [14-20] 20 (11/23 0800) BP: (137-170)/(39-81) 170/54 (11/23 0723) SpO2:  [90 %-95 %] 90 % (11/23 0723) Last BM Date : 10/22/24 General:   Alert,  Well-developed, pleasant very elderly African-American female, cooperative in NAD Head:  Normocephalic and atraumatic. Eyes:  Sclera clear, no icterus.   Conjunctiva pink. Ears:  Normal auditory acuity. Nose:  No deformity, discharge,  or lesions. Mouth:  No deformity or lesions.   Neck:  Supple; no masses or thyromegaly. Lungs:  Clear throughout to auscultation.   No wheezes, crackles, or rhonchi . Heart:  Regular rate and rhythm; no murmurs, clicks, rubs,  or gallops. Abdomen:  Soft, bowel sounds are present there is some  mild tenderness and fullness present in  the right upper quadrant and right mid quadrant, no rebound or guarding no palpable hepatosplenomegaly.   Rectal: Not done Msk:  Symmetrical without gross deformities. . Pulses:  Normal pulses noted. Extremities:  Without clubbing or edema. Neurologic:  Alert and  oriented x4;  grossly normal neurologically. Skin:  Intact without significant lesions or rashes.. Psych:  Alert and cooperative. Normal mood and affect.  Intake/Output from previous day: 11/22 0701 - 11/23 0700 In: 905.2 [I.V.:905.2] Out: -  Intake/Output this shift: No intake/output data recorded.  Lab Results: Recent Labs    10/20/24 0900 10/21/24 0255 10/22/24 0320  WBC 13.2* 14.4* 12.9*  HGB 10.5* 9.6* 9.0*  HCT 32.2* 29.9* 27.8*  PLT 221 220 234   BMET Recent Labs    10/20/24 0900 10/21/24 0255 10/22/24 0320  NA 136 135 140  K 3.4* 4.4 3.6  CL 102 105 106  CO2 18* 16* 17*  GLUCOSE 81 79 82  BUN 9 10 7*  CREATININE 1.34* 1.38* 1.53*  CALCIUM  9.3 9.4 9.5   LFT Recent Labs    10/22/24 0320  PROT 5.4*  ALBUMIN 2.0*  AST 24  ALT 24  ALKPHOS 57  BILITOT 1.5*   PT/INR No results for input(s): LABPROT, INR in the last 72 hours. Hepatitis Panel No results for input(s): HEPBSAG, HCVAB, HEPAIGM, HEPBIGM in the last 72 hours.   IMPRESSION:  #34 88 year old African-American female who was admitted on 10/13/2024 with upper abdominal pain primarily right upper quadrant initially and poor oral intake  Since of initial evaluation as outlined above with CT noncontrasted then ultrasound.  She was noted to have gallbladder wall thickening and significant edema and fluid in the area of the gallbladder fossa and hepatoduodenal ligament nonspecific but concerning for acute cholecystitis.  However due to finding of right lobe liver lesions there was concern about possible underlying neoplastic process. She was seen by surgery, has been covered with IV Zosyn  and  surgery recommended that MRI done.  MRI again identified lesions as multicystic lesions, also noted Periportal edema and progressive edema within the right upper quadrant and along the duodenum, gallbladder fossa and hepatic flexure  Biopsy of right liver lesion done 11/17-reactive changes-no evidence of malignancy  Repeat MRI yesterday now shows the multicystic process possibly to be cystic dilation of peripheral biliary radicles She has had progressive increase in the complex fluid collection in the right hemiabdomen quite large measuring 8.2 x 6.9 x 20.2 cm extending down into the right lower quadrant.  There is some evidence for early loculation, and continued gallbladder wall edema and Perry cholecystic fluid well is increasing right pleural effusion  Overall this picture is still very concerning for the initial process being acute cholecystitis/acalculous, with development of Peri cholecystic fluid, which has now become a large fluid collection measuring 8.2 x 6.9 x 20 cm and somewhat loculated over the past week.  While it is possible that she could have had some bleeding after the biopsy of the liver lesion this fluid collection was already there and has shown further progression.  Is unclear at present whether this fluid collection is an evolving abscess secondary to cholecystitis or whether this actually represents a biloma.  # 2  Mild cognitive dysfunction #3 chronic kidney disease #4 hyperlipidemia  PLAN: Diet as tolerates Continue Zosyn  Will consult IR for consideration of drainage of this large fluid collection send fluid for Gram stain, culture, and bilirubin Await HIDA scan which was ordered yesterday-if evidence of persistent cholecystitis then may  also need IR placed cholecystostomy GI will continue to follow with you   Amy Esterwood PA-C 10/23/2024, 8:28 AM    Attending physician's note   I have taken a history, reviewed the chart, and examined the patient. I  performed a substantive portion of this encounter, including complete performance of at least one of the key components, in conjunction with the APP. I agree with the APP's note, impression, and recommendations with my edits.   88 year old female with medical history as outlined above, and GI service consulted due to large fluid collection seen on multiple imaging studies.  Had initially presented on 11/13 with abdominal cramping.  Did have significantly elevated lipase >2800 on admission, but imaging without radiographic pancreatitis.  CT did show 4.8 x 3.1 cm lesion in the right lobe of the liver along with smaller lesions in the dome of the liver, GB distention with wall thickening and edema/fluid around GB fossa.  Subsequent RUQ US  with Abran cholecystic fluid.  Underwent MRI/MRCP on 11/14 showing multiple multicystic enhancing hepatic lesions up to 3.6 x 3.0 cm in the dome and 6.3 x 3.8 cm in the right hepatic lobe concerning for abscesses vs metastatic disease.  Underwent CT-guided liver biopsy on 11/17, but pathology with nonspecific inflammation and no evidence of malignancy.  Yesterday underwent repeat MRI liver, now more notable for for a large 8.2 x 6.9 x 20.2 centimeter fluid accumulation in the right hemiabdomen along with 5.6 x 3.5 x 6.3 cm area of hyperemia with GB wall edema and Perry cholecystic fluid along with multiple cystic areas possibly representing dilated biliary radicles.  T. bili 1.5 and stable, with otherwise normal ALP and normal liver enzymes.  WBC 12.9.  She is otherwise without any abdominal pain now.  Tolerating p.o. intake.  Not entirely clear with this fluid collection is.  DDx includes bile leak, instrumentation, infection/abscess, etc.  Discussed with patient and family members at bedside today. - Continue Zosyn  - HIDA scan - IR consult for fluid sampling and send for Gram stain, culture, cytology - GI service will continue to follow.  Dr. Abran will assume her  ongoing inpatient GI care starting tomorrow morning    Sandor Flatter, DO, FACG [phone] office

## 2024-10-23 NOTE — Progress Notes (Signed)
 Mobility Specialist Progress Note;   10/23/24 1220  Mobility  Activity Ambulated with assistance  Level of Assistance Standby assist, set-up cues, supervision of patient - no hands on  Assistive Device Four wheel walker  Distance Ambulated (ft) 500 ft  Activity Response Tolerated well  Mobility Referral Yes  Mobility visit 1 Mobility  Mobility Specialist Start Time (ACUTE ONLY) 1220  Mobility Specialist Stop Time (ACUTE ONLY) 1233  Mobility Specialist Time Calculation (min) (ACUTE ONLY) 13 min   Pt eager for mobility. Required no physical assistance during ambulation, SV for safety. VSS throughout and no c/o when asked. Pt returned back to bed and left with all needs met, alarm on.   Lauraine Erm Mobility Specialist Please contact via SecureChat or [employer] [phone]

## 2024-10-23 NOTE — Plan of Care (Signed)

## 2024-10-23 NOTE — Plan of Care (Signed)
   Problem: Clinical Measurements: Goal: Will remain free from infection Outcome: Progressing Goal: Diagnostic test results will improve Outcome: Progressing

## 2024-10-23 NOTE — Progress Notes (Addendum)
 PROGRESS NOTE        PATIENT DETAILS Name: Haley Randall Age: 88 y.o. Sex: female Date of Birth: 1936/09/29 Admit Date: 10/13/2024 Admitting Physician [name], MD ERE:Ilhjo, Ginger, FNP  Brief Summary: Patient is a 88 y.o.  female with history of HTN, HLD-who presented with abdominal pain-found to have pancreatitis/vague liver lesions on CT abdomen.  Significant events: 11/13>> admit to [facility]  Significant studies: 11/13>> CXR: No PNA 11/13>> CT abdomen/pelvis: 4.8 x 3.1 cm subtle lesion-right liver-additional small lesions measuring up to 2.1 cm.  Gallbladder distended.  Edema/inflammation along the descending ascending colon/hepatic flexure. 11/13>> RUQ ultrasound: Gallbladder wall upper normal to borderline thickened, no echogenic gallstones.  CBD 8 mm. 11/14>> MRI abdomen: Multicystic enhancing lesions in the liver-abscesses versus metastatic disease 10/17/2024.  CT-guided liver biopsy by IR   10/22/24 - repeat MRCP -  1. Progressive complex fluid accumulation within the right hemiabdomen with early loculation. The complex fluid shows increased T1 signal, which may be seen with underlying concentrated bile duct, blood products or proteinaceous material. A nuclear medicine HIDA scan may be helpful to exclude bile leak. 2. New gallbladder wall edema with pericholecystic fluid. No gallstones identified. 3. Persistent area of hyperemia surrounding multiple cystic areas in the periphery of segment 7 and dome of liver which may re represent multiple dilated biliary radicles in the setting of cholangitis. No discrete drainable abscess or mass noted. 4. Interval increase in volume of right pleural effusion, now moderate in volume, with overlying atelectasis or consolidation.  Significant microbiology data:  11/13>> COVID/influenza/RSV PCR: Negative 11/13>> urine culture: Multiple species 11/13>> 1/2: Gram-positive cocci (prelim Staph epidermidis)-likely  contamination. 11/14>> acute hepatitis serology: Negative 11/14>> RPR: Nonreactive     Consults: General surgery IR [place] [facility] called x 2, Dr. San.  They have nothing to offer in terms of doing a consultation.  Subjective:  Patient in bed, appears comfortable, denies any headache, no fever, no chest pain or pressure, no shortness of breath , no abdominal pain. No new focal weakness.   Objective: Vitals: Blood pressure (!) 170/54, pulse 98, temperature 99 F (37.2 C), temperature source Oral, resp. rate 20, height 4' 11 (1.499 m), weight 58.2 kg, SpO2 90%.   Exam:  Awake Alert, No new F.N deficits, Normal affect [place].AT,PERRAL Supple Neck, No JVD,   Symmetrical Chest wall movement, Good air movement bilaterally, CTAB RRR,No Gallops, Rubs or new Murmurs,  +ve B.Sounds, Abd Soft, nontender No Cyanosis, Clubbing or edema   Assessment/Plan:  Acute pancreatitis with asymptomatic transaminitis and mildly elevated bilirubin levels With supportive care acute pancreatitis completely resolved, no gallstones on imaging, no history of alcohol use, MRCP unremarkable in terms of no choledocholithiasis-no major pancreatic abnormality on MRI.  Transaminitis - Hyperbilirubinemia Potentially due to mass effect from liver lesions-alternatively could could have passed a small stone Acute hepatitis serology negative MRI liver/MRCP as above, LFT trend has improved minimal elevation in total bilirubin only at this time.  Remains symptom-free.  Multicystic enhancing liver lesions on MRCP abdomen, biopsy not consistent with malignancy Clinically initially was unclear whether this is abscess (does have colitis on CT imaging) versus metastatic disease S/p liver biopsy 11/17, unable to get Gram stain and culture due to specimen being put in formalin, pathology not show any malignancy.    On Zosyn (10/13/24) and now completely symptom-free, blood cultures 1 out of  2 suggestive of  contamination, clinically much improved and symptom-free, clinically more suggestive of an abscess coming from the colon, repeating MRI liver on 10/22/2024 shows complex fluid accumulation in the right upper quadrant with early loculation with some gallbladder edema, case discussed with GI Dr. San x 2, he has nothing to offer.  Discussed with the radiologist who suggested HIDA scan could be helpful, patient relatively stable HIDA scan ordered stat on 10/22/2024 likely will be done Monday per staffing constraints.  Updated daughter.  ? Colitis Discussed with GI, Dr. San, most likely more local inflammation from liver pathology rather than focal colitis   Gram-positive bacteremia-coag negative staph (preliminary result)  likely a contaminant, 1 out of 2 blood cultures, other one showed no growth for completion  HTN Systolic blood pressures remain elevated but diastolics remain low which has been a problem in the past No return of tachycardia after starting metoprolol  Hold off on resuming amlodipine  Trial TED stockings, issues with orthostatic hypotension in the past  NAGMA Prerenal AKI on CKD stage IIIa/b Bicarb continues to fluctuate between 16 and 19 Likely 2/2 renal dysfunction, which has been stable at baseline over the past few days after initial AKI Continue to monitor   Normocytic anemia No recent baseline but hemoglobin had been normal over 1 year ago Hemoglobin 11 on admission and dropped to 7.3 on 11/19, s/p 1 unit PRBCs on 11/19 Hemoglobin stable at 9.6 this morning Iron labs most consistent with anemia of chronic disease Continue ferrous sulfate   Hypomagnesemia, hypokalemia, hypophosphatemia Continue to monitor and replete as needed  ?  Mild dementia/cognitive issues B12/RPR all stable Ammonia level is only minimally elevated-doubt of any clinical significance. No confusion this morning-continue to maintain delirium precautions  Elevated TSH TSH 5.8 with  T4 1.04.  Likely sick euthyroid and will need repeat TSH in 4 to 6 weeks  Palliative care Previously had long discussion with the patient/daughter regarding findings on MRI that could represent metastatic disease.  Underwent liver biopsy 11/17 and awaiting results but if metastatic disease they seem to be leaning towards stopping further workup but will continue to discuss  Code status:   Code Status: Full Code   DVT Prophylaxis: Place TED hose Start: 10/19/24 0932 enoxaparin  (LOVENOX ) injection 30 mg Start: 10/18/24 1000 Place and maintain sequential compression device Start: 10/16/24 0843   Family Communication: Daughter [name] [phone]  called and updated over the phone on 10/17/2024  Disposition Plan: Status is: Observation The patient will require care spanning > 2 midnights and should be moved to inpatient because: Severity of illness   Planned Discharge Destination:Home   Diet: Diet Order             Diet regular Room service appropriate? Yes with Assist; Fluid consistency: Thin  Diet effective now                    MEDICATIONS: Scheduled Meds:  amLODipine   10 mg Oral Daily   enoxaparin  (LOVENOX ) injection  30 mg Subcutaneous Daily   ferrous sulfate   325 mg Oral Q breakfast   folic acid   1 mg Oral Daily   metoprolol  tartrate  50 mg Oral BID   multivitamin with minerals  1 tablet Oral Daily   pantoprazole  (PROTONIX ) IV  40 mg Intravenous Q12H   Continuous Infusions:  piperacillin -tazobactam (ZOSYN )  IV 3.375 g (10/23/24 0551)   PRN Meds:.acetaminophen , albuterol , diltiazem , fentaNYL  (SUBLIMAZE ) injection, hydrALAZINE , LORazepam , ondansetron  **OR** ondansetron  (ZOFRAN ) IV, sodium chloride  flush  I have personally reviewed following labs and imaging studies  LABORATORY DATA:   Data Review:   Inpatient Medications  Scheduled Meds:  amLODipine   10 mg Oral Daily   enoxaparin  (LOVENOX ) injection  30 mg Subcutaneous Daily   ferrous sulfate   325 mg Oral  Q breakfast   folic acid   1 mg Oral Daily   metoprolol  tartrate  50 mg Oral BID   multivitamin with minerals  1 tablet Oral Daily   pantoprazole  (PROTONIX ) IV  40 mg Intravenous Q12H   Continuous Infusions:  piperacillin -tazobactam (ZOSYN )  IV 3.375 g (10/23/24 0551)   PRN Meds:.acetaminophen , albuterol , diltiazem , fentaNYL  (SUBLIMAZE ) injection, hydrALAZINE , LORazepam , ondansetron  **OR** ondansetron  (ZOFRAN ) IV, sodium chloride  flush  DVT Prophylaxis  Place TED hose Start: 10/19/24 0932 enoxaparin  (LOVENOX ) injection 30 mg Start: 10/18/24 1000 Place and maintain sequential compression device Start: 10/16/24 0843   Recent Labs  Lab 10/18/24 0640 10/19/24 0347 10/19/24 1402 10/20/24 0900 10/21/24 0255 10/22/24 0320  WBC 9.5 8.1  --  13.2* 14.4* 12.9*  HGB 8.1* 7.3* 10.6* 10.5* 9.6* 9.0*  HCT 25.3* 22.7* 31.6* 32.2* 29.9* 27.8*  PLT 176 155  --  221 220 234  MCV 80.1 79.9*  --  82.1 84.2 83.0  MCH 25.6* 25.7*  --  26.8 27.0 26.9  MCHC 32.0 32.2  --  32.6 32.1 32.4  RDW 16.3* 16.3*  --  16.4* 17.0* 17.2*  LYMPHSABS 0.7 0.7  --  0.8 0.9 0.7  MONOABS 0.7 0.8  --  1.0 1.1* 0.9  EOSABS 0.1 0.1  --  0.0 0.0 0.1  BASOSABS 0.0 0.0  --  0.1 0.1 0.0    Recent Labs  Lab 10/17/24 0404 10/17/24 0655 10/17/24 0655 10/18/24 0343 10/19/24 0347 10/20/24 0900 10/21/24 0255 10/22/24 0320  NA  --  137   < > 136 138 136 135 140  K  --  3.3*   < > 4.2 2.9* 3.4* 4.4 3.6  CL  --  108   < > 109 107 102 105 106  CO2  --  19*   < > 16* 19* 18* 16* 17*  ANIONGAP  --  10   < > 11 12 16* 14 17*  GLUCOSE  --  108*   < > 109* 107* 81 79 82  BUN  --  16   < > 13 11 9 10  7*  CREATININE  --  1.26*   < > 1.43* 1.39* 1.34* 1.38* 1.53*  AST  --  40   < > 49* 30 27 27 24   ALT  --  58*   < > 60* 39 34 28 24  ALKPHOS  --  61   < > 67 62 71 80 57  BILITOT  --  1.7*   < > 1.6* 0.9 1.5* 1.6* 1.5*  ALBUMIN  --  2.0*   < > 2.4* 2.0* 2.3* 2.2* 2.0*  PROCALCITON  --   --   --   --   --   --   --  0.47   INR 1.1  --   --   --   --   --   --   --   TSH  --   --   --  5.880*  --   --   --   --   MG  --  2.0  --  1.9 1.6* 1.9 1.9  --   PHOS  --   --   --  1.3* 2.8 2.5 2.1*  --   CALCIUM   --  8.5*   < > 9.2 8.6* 9.3 9.4 9.5   < > = values in this interval not displayed.      Recent Labs  Lab 10/17/24 0404 10/17/24 0655 10/17/24 0655 10/18/24 0343 10/19/24 0347 10/20/24 0900 10/21/24 0255 10/22/24 0320  PROCALCITON  --   --   --   --   --   --   --  0.47  INR 1.1  --   --   --   --   --   --   --   TSH  --   --   --  5.880*  --   --   --   --   MG  --  2.0  --  1.9 1.6* 1.9 1.9  --   CALCIUM   --  8.5*   < > 9.2 8.6* 9.3 9.4 9.5   < > = values in this interval not displayed.    --------------------------------------------------------------------------------------------------------------- Lab Results  Component Value Date   CHOL 157 09/01/2023   HDL 86 09/01/2023   LDLCALC 57 09/01/2023   TRIG 74 09/01/2023   CHOLHDL 1.8 09/01/2023    Lab Results  Component Value Date   HGBA1C 6.2 10/02/2022   Radiology Reports  MR LIVER W WO CONTRAST Result Date: 10/22/2024 EXAM: MRI Abdomen with and without Contrast 10/22/2024 11:12:14 AM TECHNIQUE: Multiplanar multisequence MRI of the abdomen was performed with and without the administration of intravenous contrast. 6 mL (gadobutrol  (GADAVIST ) 1 MMOL/ML injection 6 mL GADOBUTROL  1 MMOL/ML IV SOLN). COMPARISON: MRI 10/14/2024. CLINICAL HISTORY: FINDINGS: LIVER: Peripheral, multicystic enhancing process within segment 7. Imaging findings are favored to represent cystic dilatation of peripheral biliary radicles. No well-defined drainable abscess identified. Surrounding hyperemia on the arterial phase images noted with mural enhancing dilated biliary radicles extending up to this area noted. On the current exam, the affected area measures approximately 5.6 x 3.5 x 6.3 cm (image 27/100). On the previous exam, this area measured 6.3 x 3.8 x 6.9  cm. No new abnormalities identified within the liver. GALLBLADDER AND BILIARY SYSTEM: New gallbladder wall edema is noted with wall thickness measuring up to 6 mm with signs of pericholecystic edema/fluid. No gallstones identified. Common bile duct measures up to 6 mm. No signs of choledocholithiasis. No intrahepatic ductal dilation. SPLEEN: Normal appearance of the spleen. PANCREAS: No signs of pancreatic main duct dilatation or mass. ADRENAL GLANDS: Unremarkable. KIDNEYS: Bosniak class 1 cyst identified within the inferior pole of the left kidney measuring up to 1.6 cm (image 26/4). LYMPH NODES: No adenopathy identified. VASCULATURE: Extensive aortic atherosclerotic calcifications. PERITONEUM: Progressive complex fluid accumulation within the right hemiabdomen extends from the gallbladder into the right iliac fossa .This collection of fluid measures approximately 8.2 x 6.9 x 20.2 cm. Overlying enhancement is noted involving the peritoneal reflection with signs suggestive of early loculation. The fluid shows mixed signal intensity but is predominantly increased on the T1 weighted sequences and increased on the T2-weighted sequences which may be seen with a biloma and/or hematoma. BOWEL: As noted previously, the duodenum appears edematous with wall thickening. The duodenum does not cross the midline consistent with a malrotation deformity. ABDOMINAL WALL: Mild body wall edema noted. BONES: No abnormal enhancement identified within the visualized osseous structures. No acute abnormality. LUNGS/PLEURAL SPACES: Interval increase in volume of right pleural effusion, which is now moderate in volume, with overlying atelectasis or consolidation. IMPRESSION: 1. Progressive complex fluid accumulation  within the right hemiabdomen with early loculation. The complex fluid shows increased T1 signal, which may be seen with underlying concentrated bile duct, blood products or proteinaceous material. A nuclear medicine HIDA scan  may be helpful to exclude bile leak. 2. New gallbladder wall edema with pericholecystic fluid. No gallstones identified. 3. Persistent area of hyperemia surrounding multiple cystic areas in the periphery of segment 7 and dome of liver which may re represent multiple dilated biliary radicles in the setting of cholangitis. No discrete drainable abscess or mass noted. 4. Interval increase in volume of right pleural effusion, now moderate in volume, with overlying atelectasis or consolidation. Electronically signed by: Waddell Calk MD 10/22/2024 12:00 PM EST RP Workstation: [id number]     Signature  -    Lavada Stank M.D on 10/23/2024 at 8:36 AM   -  To page go to [email or web address]

## 2024-10-24 ENCOUNTER — Inpatient Hospital Stay (HOSPITAL_COMMUNITY)

## 2024-10-24 DIAGNOSIS — Z0389 Encounter for observation for other suspected diseases and conditions ruled out: Secondary | ICD-10-CM | POA: Diagnosis not present

## 2024-10-24 DIAGNOSIS — R109 Unspecified abdominal pain: Secondary | ICD-10-CM | POA: Diagnosis not present

## 2024-10-24 DIAGNOSIS — E440 Moderate protein-calorie malnutrition: Secondary | ICD-10-CM | POA: Insufficient documentation

## 2024-10-24 LAB — CBC WITH DIFFERENTIAL/PLATELET
Abs Immature Granulocytes: 0.15 K/uL — ABNORMAL HIGH (ref 0.00–0.07)
Basophils Absolute: 0 K/uL (ref 0.0–0.1)
Basophils Relative: 0 %
Eosinophils Absolute: 0 K/uL (ref 0.0–0.5)
Eosinophils Relative: 0 %
HCT: 35.9 % — ABNORMAL LOW (ref 36.0–46.0)
Hemoglobin: 11.8 g/dL — ABNORMAL LOW (ref 12.0–15.0)
Immature Granulocytes: 1 %
Lymphocytes Relative: 5 %
Lymphs Abs: 0.6 K/uL — ABNORMAL LOW (ref 0.7–4.0)
MCH: 26.7 pg (ref 26.0–34.0)
MCHC: 32.9 g/dL (ref 30.0–36.0)
MCV: 81.2 fL (ref 80.0–100.0)
Monocytes Absolute: 0.5 K/uL (ref 0.1–1.0)
Monocytes Relative: 5 %
Neutro Abs: 10.4 K/uL — ABNORMAL HIGH (ref 1.7–7.7)
Neutrophils Relative %: 89 %
Platelets: 297 K/uL (ref 150–400)
RBC: 4.42 MIL/uL (ref 3.87–5.11)
RDW: 17.2 % — ABNORMAL HIGH (ref 11.5–15.5)
WBC: 11.8 K/uL — ABNORMAL HIGH (ref 4.0–10.5)
nRBC: 0 % (ref 0.0–0.2)

## 2024-10-24 LAB — COMPREHENSIVE METABOLIC PANEL WITH GFR
ALT: 27 U/L (ref 0–44)
AST: 37 U/L (ref 15–41)
Albumin: 2.5 g/dL — ABNORMAL LOW (ref 3.5–5.0)
Alkaline Phosphatase: 71 U/L (ref 38–126)
Anion gap: 17 — ABNORMAL HIGH (ref 5–15)
BUN: 7 mg/dL — ABNORMAL LOW (ref 8–23)
CO2: 19 mmol/L — ABNORMAL LOW (ref 22–32)
Calcium: 9.3 mg/dL (ref 8.9–10.3)
Chloride: 100 mmol/L (ref 98–111)
Creatinine, Ser: 1.24 mg/dL — ABNORMAL HIGH (ref 0.44–1.00)
GFR, Estimated: 42 mL/min — ABNORMAL LOW (ref >60)
Glucose, Bld: 88 mg/dL (ref 70–99)
Potassium: 3.3 mmol/L — ABNORMAL LOW (ref 3.5–5.1)
Sodium: 136 mmol/L (ref 135–145)
Total Bilirubin: 1.2 mg/dL (ref 0.0–1.2)
Total Protein: 6.9 g/dL (ref 6.5–8.1)

## 2024-10-24 LAB — C-REACTIVE PROTEIN: CRP: 15.7 mg/dL — ABNORMAL HIGH (ref <1.0)

## 2024-10-24 LAB — PROCALCITONIN: Procalcitonin: 0.19 ng/mL

## 2024-10-24 LAB — PROTIME-INR
INR: 1.2 (ref 0.8–1.2)
Prothrombin Time: 15.6 s — ABNORMAL HIGH (ref 11.4–15.2)

## 2024-10-24 LAB — MAGNESIUM: Magnesium: 1.7 mg/dL (ref 1.7–2.4)

## 2024-10-24 MED ORDER — MORPHINE SULFATE (PF) 4 MG/ML IV SOLN
2.3000 mg | Freq: Once | INTRAVENOUS | Status: AC
  Administered 2024-10-24: 2.3 mg via INTRAVENOUS
  Filled 2024-10-24: qty 1

## 2024-10-24 MED ORDER — TECHNETIUM TC 99M MEBROFENIN IV KIT
5.0000 | PACK | Freq: Once | INTRAVENOUS | Status: AC | PRN
  Administered 2024-10-24: 5.5 via INTRAVENOUS

## 2024-10-24 MED ORDER — POTASSIUM CHLORIDE 2 MEQ/ML IV SOLN
INTRAVENOUS | Status: AC
  Filled 2024-10-24 (×2): qty 1000

## 2024-10-24 MED ORDER — MAGNESIUM SULFATE 2 GM/50ML IV SOLN
2.0000 g | Freq: Once | INTRAVENOUS | Status: AC
  Administered 2024-10-24: 2 g via INTRAVENOUS
  Filled 2024-10-24: qty 50

## 2024-10-24 MED ORDER — POTASSIUM CL IN DEXTROSE 5% 20 MEQ/L IV SOLN: 20.0000 meq | INTRAVENOUS | Status: DC

## 2024-10-24 NOTE — Plan of Care (Signed)
  Problem: Health Behavior/Discharge Planning: Goal: Ability to manage health-related needs will improve Outcome: Progressing   Problem: Clinical Measurements: Goal: Will remain free from infection Outcome: Progressing Goal: Diagnostic test results will improve Outcome: Progressing   Problem: Safety: Goal: Ability to remain free from injury will improve Outcome: Progressing

## 2024-10-24 NOTE — Progress Notes (Signed)
 Occupational Therapy Treatment Patient Details Name: Haley Randall MRN: [id number] DOB: 26-Feb-1936 Today's Date: 10/24/2024   History of present illness 88 y.o. female adm 10/13/24 with medical history significant of hypertension, hyperlipidemia, and gout presents with abdominal cramps.  Admitted for abdominal cramps.   OT comments  Patient essentially Mod I for toileting task with 4WRW, stand grooming and lower body dressing from sit to stand level.  Frequency dropped to 1x/wk, and most likely OT will discharge next week if she remains in the acute setting.  No post acute OT is anticipated.        If plan is discharge home, recommend the following:  Assist for transportation;Assistance with cooking/housework   Equipment Recommendations  None recommended by OT    Recommendations for Other Services      Precautions / Restrictions Precautions Precautions: Fall Recall of Precautions/Restrictions: Intact Restrictions Weight Bearing Restrictions Per Provider Order: No       Mobility Bed Mobility Overal bed mobility: Needs Assistance Bed Mobility: Sit to Supine       Sit to supine: Modified independent (Device/Increase time)        Transfers Overall transfer level: Modified independent Equipment used: Rollator (4 wheels) Transfers: Sit to/from Stand Sit to Stand: Modified independent (Device/Increase time)                 Balance Overall balance assessment: Needs assistance Sitting-balance support: Feet supported Sitting balance-Leahy Scale: Normal     Standing balance support: Reliant on assistive device for balance Standing balance-Leahy Scale: Fair                             ADL either performed or assessed with clinical judgement   ADL       Grooming: Modified independent               Lower Body Dressing: Modified independent   Toilet Transfer: Modified Independent                  Extremity/Trunk Assessment Upper  Extremity Assessment Upper Extremity Assessment: Overall WFL for tasks assessed   Lower Extremity Assessment Lower Extremity Assessment: Defer to PT evaluation   Cervical / Trunk Assessment Cervical / Trunk Assessment: Normal    Vision Patient Visual Report: No change from baseline     Perception Perception Perception: Not tested   Praxis Praxis Praxis: Not tested   Communication Communication Communication: No apparent difficulties   Cognition Arousal: Alert Behavior During Therapy: WFL for tasks assessed/performed Cognition: History of cognitive impairments             OT - Cognition Comments: ST Memory deficits.                 Following commands: Intact        Cueing   Cueing Techniques: Verbal cues  Exercises      Shoulder Instructions       General Comments      Pertinent Vitals/ Pain       Pain Assessment Pain Assessment: No/denies pain Pain Intervention(s): Monitored during session                                                          Frequency  Min 1X/week  Progress Toward Goals  OT Goals(current goals can now be found in the care plan section)  Progress towards OT goals: Progressing toward goals;OT to reassess next treatment  Acute Rehab OT Goals OT Goal Formulation: With patient Time For Goal Achievement: 11/07/24 ADL Goals Pt Will Perform Lower Body Dressing: with modified independence  Plan      Co-evaluation                 AM-PAC OT 6 Clicks Daily Activity     Outcome Measure   Help from another person eating meals?: None Help from another person taking care of personal grooming?: None Help from another person toileting, which includes using toliet, bedpan, or urinal?: None Help from another person bathing (including washing, rinsing, drying)?: None Help from another person to put on and taking off regular upper body clothing?: None Help from another person to put on  and taking off regular lower body clothing?: None 6 Click Score: 24    End of Session Equipment Utilized During Treatment: Rollator (4 wheels)  OT Visit Diagnosis: Unsteadiness on feet (R26.81)   Activity Tolerance Patient tolerated treatment well   Patient Left in bed;with call bell/phone within reach   Nurse Communication Mobility status        Time: 1130-1147 OT Time Calculation (min): 17 min  Charges: OT General Charges $OT Visit: 1 Visit OT Treatments $Self Care/Home Management : 8-22 mins  10/24/2024  RP, OTR/L  Acute Rehabilitation Services  Office:  [phone]   Haley Randall 10/24/2024, 11:49 AM

## 2024-10-24 NOTE — Progress Notes (Signed)
 PROGRESS NOTE        PATIENT DETAILS Name: Haley Randall Age: 88 y.o. Sex: female Date of Birth: [birth year] Admit Date: 10/13/2024 Admitting Physician [name], MD ERE:Ilhjo, Ginger, FNP  Brief Summary: Patient is a 88 y.o.  female with history of HTN, HLD-who presented with abdominal pain-found to have pancreatitis/vague liver lesions on CT abdomen.  Significant events: 11/13>> admit to [facility]  Significant studies: 11/13>> CXR: No PNA 11/13>> CT abdomen/pelvis: 4.8 x 3.1 cm subtle lesion-right liver-additional small lesions measuring up to 2.1 cm.  Gallbladder distended.  Edema/inflammation along the descending ascending colon/hepatic flexure. 11/13>> RUQ ultrasound: Gallbladder wall upper normal to borderline thickened, no echogenic gallstones.  CBD 8 mm. 11/14>> MRI abdomen: Multicystic enhancing lesions in the liver-abscesses versus metastatic disease 10/17/2024.  CT-guided liver biopsy by IR   10/22/24 - repeat MRCP -  1. Progressive complex fluid accumulation within the right hemiabdomen with early loculation. The complex fluid shows increased T1 signal, which may be seen with underlying concentrated bile duct, blood products or proteinaceous material. A nuclear medicine HIDA scan may be helpful to exclude bile leak. 2. New gallbladder wall edema with pericholecystic fluid. No gallstones identified. 3. Persistent area of hyperemia surrounding multiple cystic areas in the periphery of segment 7 and dome of liver which may re represent multiple dilated biliary radicles in the setting of cholangitis. No discrete drainable abscess or mass noted. 4. Interval increase in volume of right pleural effusion, now moderate in volume, with overlying atelectasis or consolidation.  Significant microbiology data:  11/13>> COVID/influenza/RSV PCR: Negative 11/13>> urine culture: Multiple species 11/13>> 1/2: Gram-positive cocci (prelim Staph epidermidis)-likely  contamination. 11/14>> acute hepatitis serology: Negative 11/14>> RPR: Nonreactive     Consults: General surgery IR [place] [facility] called x 2, Dr. San.  They have nothing to offer in terms of doing a consultation.  Subjective: Patient in bed, appears comfortable, denies any headache, no fever, no chest pain or pressure, no shortness of breath , no abdominal pain. No focal weakness.   Objective: Vitals: Blood pressure (!) 157/55, pulse 81, temperature 97.7 F (36.5 C), temperature source Oral, resp. rate 16, height 4' 11 (1.499 m), weight 58.2 kg, SpO2 94%.   Exam:  Awake Alert, No new F.N deficits, Normal affect [place].AT,PERRAL Supple Neck, No JVD,   Symmetrical Chest wall movement, Good air movement bilaterally, CTAB RRR,No Gallops, Rubs or new Murmurs,  +ve B.Sounds, Abd Soft, nontender No Cyanosis, Clubbing or edema   Assessment/Plan:  Acute pancreatitis with asymptomatic transaminitis and mildly elevated bilirubin levels With supportive care acute pancreatitis completely resolved, no gallstones on imaging, no history of alcohol use, MRCP unremarkable in terms of no choledocholithiasis-no major pancreatic abnormality on MRI.  Transaminitis - Hyperbilirubinemia Potentially due to mass effect from liver lesions-alternatively could could have passed a small stone Acute hepatitis serology negative MRI liver/MRCP as above, LFT trend has improved minimal elevation in total bilirubin only at this time.  Remains symptom-free.  Multicystic enhancing liver lesions on MRCP abdomen, biopsy not consistent with malignancy Clinically initially was unclear whether this is abscess (does have colitis on CT imaging) versus metastatic disease S/p liver biopsy 11/17, unable to get Gram stain and culture due to specimen being put in formalin, pathology not show any malignancy.    On Zosyn (10/13/24) and now completely symptom-free, blood cultures 1 out of 2 suggestive  of  contamination, clinically much improved and symptom-free, clinically more suggestive of an abscess coming from the colon, repeating MRI liver on 10/22/2024 shows complex fluid accumulation in the right upper quadrant with early loculation with some gallbladder edema, case discussed with GI Dr. San who saw the patient on 10/23/2024.  Discussed with the radiologist who suggested HIDA scan could be helpful, HIDA scan to be done 10/24/2024.  Updated daughter.  ? Colitis Discussed with GI, Dr. San, most likely more local inflammation from liver pathology rather than focal colitis   Gram-positive bacteremia-coag negative staph (preliminary result)  likely a contaminant, 1 out of 2 blood cultures, other one showed no growth for completion  HTN Systolic blood pressures remain elevated but diastolics remain low which has been a problem in the past No return of tachycardia after starting metoprolol  Hold off on resuming amlodipine  Trial TED stockings, issues with orthostatic hypotension in the past  NAGMA Prerenal AKI on CKD stage IIIa/b Bicarb continues to fluctuate between 16 and 19 Likely 2/2 renal dysfunction, which has been stable at baseline over the past few days after initial AKI Continue to monitor   Normocytic anemia No recent baseline but hemoglobin had been normal over 1 year ago Hemoglobin 11 on admission and dropped to 7.3 on 11/19, s/p 1 unit PRBCs on 11/19 Hemoglobin stable at 9.6 this morning Iron labs most consistent with anemia of chronic disease Continue ferrous sulfate   Hypomagnesemia, hypokalemia, hypophosphatemia Continue to monitor and replete as needed  ?  Mild dementia/cognitive issues B12/RPR all stable Ammonia level is only minimally elevated-doubt of any clinical significance. No confusion this morning-continue to maintain delirium precautions  Elevated TSH TSH 5.8 with T4 1.04.  Likely sick euthyroid and will need repeat TSH in 4 to 6  weeks  Palliative care Previously had long discussion with the patient/daughter regarding findings on MRI that could represent metastatic disease.  Underwent liver biopsy 11/17 and awaiting results but if metastatic disease they seem to be leaning towards stopping further workup but will continue to discuss  Code status:   Code Status: Full Code   DVT Prophylaxis: Place TED hose Start: 10/19/24 0932 enoxaparin  (LOVENOX ) injection 30 mg Start: 10/18/24 1000 Place and maintain sequential compression device Start: 10/16/24 0843   Family Communication: Daughter [name] [phone]  called and updated over the phone on 10/17/2024  Disposition Plan: Status is: Observation The patient will require care spanning > 2 midnights and should be moved to inpatient because: Severity of illness   Planned Discharge Destination:Home   Diet: Diet Order             Diet NPO time specified Except for: Sips with Meds  Diet effective midnight                    MEDICATIONS: Scheduled Meds:  amLODipine   10 mg Oral Daily   enoxaparin  (LOVENOX ) injection  30 mg Subcutaneous Daily   feeding supplement  237 mL Oral BID BM   ferrous sulfate   325 mg Oral Q breakfast   folic acid   1 mg Oral Daily   metoprolol  tartrate  50 mg Oral BID   multivitamin with minerals  1 tablet Oral Daily   pantoprazole   40 mg Oral BID   Continuous Infusions:  piperacillin -tazobactam (ZOSYN )  IV 3.375 g (10/24/24 0516)   PRN Meds:.acetaminophen , albuterol , diltiazem , fentaNYL  (SUBLIMAZE ) injection, hydrALAZINE , LORazepam , ondansetron  **OR** ondansetron  (ZOFRAN ) IV, sodium chloride  flush   I have personally reviewed following labs and imaging  studies  LABORATORY DATA:   Data Review:   Inpatient Medications  Scheduled Meds:  amLODipine   10 mg Oral Daily   enoxaparin  (LOVENOX ) injection  30 mg Subcutaneous Daily   feeding supplement  237 mL Oral BID BM   ferrous sulfate   325 mg Oral Q breakfast   folic acid   1  mg Oral Daily   metoprolol  tartrate  50 mg Oral BID   multivitamin with minerals  1 tablet Oral Daily   pantoprazole   40 mg Oral BID   Continuous Infusions:  piperacillin -tazobactam (ZOSYN )  IV 3.375 g (10/24/24 0516)   PRN Meds:.acetaminophen , albuterol , diltiazem , fentaNYL  (SUBLIMAZE ) injection, hydrALAZINE , LORazepam , ondansetron  **OR** ondansetron  (ZOFRAN ) IV, sodium chloride  flush  DVT Prophylaxis  Place TED hose Start: 10/19/24 0932 enoxaparin  (LOVENOX ) injection 30 mg Start: 10/18/24 1000 Place and maintain sequential compression device Start: 10/16/24 0843   Recent Labs  Lab 10/18/24 0640 10/19/24 0347 10/19/24 1402 10/20/24 0900 10/21/24 0255 10/22/24 0320  WBC 9.5 8.1  --  13.2* 14.4* 12.9*  HGB 8.1* 7.3* 10.6* 10.5* 9.6* 9.0*  HCT 25.3* 22.7* 31.6* 32.2* 29.9* 27.8*  PLT 176 155  --  221 220 234  MCV 80.1 79.9*  --  82.1 84.2 83.0  MCH 25.6* 25.7*  --  26.8 27.0 26.9  MCHC 32.0 32.2  --  32.6 32.1 32.4  RDW 16.3* 16.3*  --  16.4* 17.0* 17.2*  LYMPHSABS 0.7 0.7  --  0.8 0.9 0.7  MONOABS 0.7 0.8  --  1.0 1.1* 0.9  EOSABS 0.1 0.1  --  0.0 0.0 0.1  BASOSABS 0.0 0.0  --  0.1 0.1 0.0    Recent Labs  Lab 10/18/24 0343 10/19/24 0347 10/20/24 0900 10/21/24 0255 10/22/24 0320  NA 136 138 136 135 140  K 4.2 2.9* 3.4* 4.4 3.6  CL 109 107 102 105 106  CO2 16* 19* 18* 16* 17*  ANIONGAP 11 12 16* 14 17*  GLUCOSE 109* 107* 81 79 82  BUN 13 11 9 10  7*  CREATININE 1.43* 1.39* 1.34* 1.38* 1.53*  AST 49* 30 27 27 24   ALT 60* 39 34 28 24  ALKPHOS 67 62 71 80 57  BILITOT 1.6* 0.9 1.5* 1.6* 1.5*  ALBUMIN 2.4* 2.0* 2.3* 2.2* 2.0*  PROCALCITON  --   --   --   --  0.47  TSH 5.880*  --   --   --   --   MG 1.9 1.6* 1.9 1.9  --   PHOS 1.3* 2.8 2.5 2.1*  --   CALCIUM  9.2 8.6* 9.3 9.4 9.5      Recent Labs  Lab 10/18/24 0343 10/19/24 0347 10/20/24 0900 10/21/24 0255 10/22/24 0320  PROCALCITON  --   --   --   --  0.47  TSH 5.880*  --   --   --   --   MG 1.9 1.6*  1.9 1.9  --   CALCIUM  9.2 8.6* 9.3 9.4 9.5    --------------------------------------------------------------------------------------------------------------- Lab Results  Component Value Date   CHOL 157 09/01/2023   HDL 86 09/01/2023   LDLCALC 57 09/01/2023   TRIG 74 09/01/2023   CHOLHDL 1.8 09/01/2023    Lab Results  Component Value Date   HGBA1C 6.2 10/02/2022   Radiology Reports  MR LIVER W WO CONTRAST Result Date: 10/22/2024 EXAM: MRI Abdomen with and without Contrast 10/22/2024 11:12:14 AM TECHNIQUE: Multiplanar multisequence MRI of the abdomen was performed with and without the administration of intravenous contrast.  6 mL (gadobutrol  (GADAVIST ) 1 MMOL/ML injection 6 mL GADOBUTROL  1 MMOL/ML IV SOLN). COMPARISON: MRI 10/14/2024. CLINICAL HISTORY: FINDINGS: LIVER: Peripheral, multicystic enhancing process within segment 7. Imaging findings are favored to represent cystic dilatation of peripheral biliary radicles. No well-defined drainable abscess identified. Surrounding hyperemia on the arterial phase images noted with mural enhancing dilated biliary radicles extending up to this area noted. On the current exam, the affected area measures approximately 5.6 x 3.5 x 6.3 cm (image 27/100). On the previous exam, this area measured 6.3 x 3.8 x 6.9 cm. No new abnormalities identified within the liver. GALLBLADDER AND BILIARY SYSTEM: New gallbladder wall edema is noted with wall thickness measuring up to 6 mm with signs of pericholecystic edema/fluid. No gallstones identified. Common bile duct measures up to 6 mm. No signs of choledocholithiasis. No intrahepatic ductal dilation. SPLEEN: Normal appearance of the spleen. PANCREAS: No signs of pancreatic main duct dilatation or mass. ADRENAL GLANDS: Unremarkable. KIDNEYS: Bosniak class 1 cyst identified within the inferior pole of the left kidney measuring up to 1.6 cm (image 26/4). LYMPH NODES: No adenopathy identified. VASCULATURE: Extensive aortic  atherosclerotic calcifications. PERITONEUM: Progressive complex fluid accumulation within the right hemiabdomen extends from the gallbladder into the right iliac fossa .This collection of fluid measures approximately 8.2 x 6.9 x 20.2 cm. Overlying enhancement is noted involving the peritoneal reflection with signs suggestive of early loculation. The fluid shows mixed signal intensity but is predominantly increased on the T1 weighted sequences and increased on the T2-weighted sequences which may be seen with a biloma and/or hematoma. BOWEL: As noted previously, the duodenum appears edematous with wall thickening. The duodenum does not cross the midline consistent with a malrotation deformity. ABDOMINAL WALL: Mild body wall edema noted. BONES: No abnormal enhancement identified within the visualized osseous structures. No acute abnormality. LUNGS/PLEURAL SPACES: Interval increase in volume of right pleural effusion, which is now moderate in volume, with overlying atelectasis or consolidation. IMPRESSION: 1. Progressive complex fluid accumulation within the right hemiabdomen with early loculation. The complex fluid shows increased T1 signal, which may be seen with underlying concentrated bile duct, blood products or proteinaceous material. A nuclear medicine HIDA scan may be helpful to exclude bile leak. 2. New gallbladder wall edema with pericholecystic fluid. No gallstones identified. 3. Persistent area of hyperemia surrounding multiple cystic areas in the periphery of segment 7 and dome of liver which may re represent multiple dilated biliary radicles in the setting of cholangitis. No discrete drainable abscess or mass noted. 4. Interval increase in volume of right pleural effusion, now moderate in volume, with overlying atelectasis or consolidation. Electronically signed by: Waddell Calk MD 10/22/2024 12:00 PM EST RP Workstation: [id number]     Signature  -    Lavada Stank M.D on 10/24/2024 at 9:12 AM   -  To  page go to [email or web address]

## 2024-10-24 NOTE — Progress Notes (Signed)
 GI PROGRESS NOTE  Case reviewed with Dr. San. Plans were for HIDA scan and IR drainage of large fluid collection. The patient is currently off the floor.  Norleen SAILOR. Abran Raddle., M.D. [facility] Division of Gastroenterology

## 2024-10-24 NOTE — Progress Notes (Addendum)
   10/24/24 1013  Mobility  Activity Ambulated with assistance  Level of Assistance Standby assist, set-up cues, supervision of patient - no hands on  Assistive Device Four wheel walker  Distance Ambulated (ft) 150 ft  Activity Response Tolerated fair  Mobility Referral Yes  Mobility visit 1 Mobility  Mobility Specialist Start Time (ACUTE ONLY) 1013  Mobility Specialist Stop Time (ACUTE ONLY) 1024  Mobility Specialist Time Calculation (min) (ACUTE ONLY) 11 min   Mobility Specialist: Progress Note  Pt agreeable to mobility session - received in chair. Pt was asymptomatic throughout session with no complaints. Returned to chair with all needs met - call bell within reach. Chair alarm on.  Additional comments: Pt seen for additional visit. Results the same as before. Received and left in bed, bed alarm on.   Virgle Boards, BS Mobility Specialist Please contact via SecureChat or  Rehab office at [phone].

## 2024-10-24 NOTE — TOC Progression Note (Signed)
 Transition of Care [facility]) - Progression Note    Patient Details  Name: Haley Randall MRN: [id number] Date of Birth: 1936/09/13  [facility] CM/SW Contact  Andrez JULIANNA George, RN Phone Number: 10/24/2024, 2:34 PM  Clinical Narrative:     Pt having HIDA scan.  [facility] set up for [facility] services after d/c.  IP Care management following for further d/c neeeds.   Expected Discharge Plan: Home w Home Health Services Barriers to Discharge: Continued Medical Work up               Expected Discharge Plan and Services   Discharge Planning Services: CM Consult Post Acute Care Choice: Home Health Living arrangements for the past 2 months: Single Family Home                 DME Arranged: N/A         HH Arranged: PT HH Agency: [facility] Date [facility] Agency Contacted: 10/18/24 Time HH Agency Contacted: 1119 Representative spoke with at [facility] Agency: Darleene   Social Drivers of Health (SDOH) Interventions SDOH Screenings   Food Insecurity: No Food Insecurity (10/13/2024)  Housing: Low Risk  (10/13/2024)  Transportation Needs: No Transportation Needs (10/13/2024)  Utilities: Not At Risk (10/13/2024)  Alcohol Screen: Low Risk  (08/24/2023)  Depression (PHQ2-9): Low Risk  (08/24/2023)  Financial Resource Strain: Low Risk  (08/24/2023)  Physical Activity: Inactive (08/24/2023)  Social Connections: Socially Isolated (10/13/2024)  Stress: No Stress Concern Present (08/24/2023)  Tobacco Use: Medium Risk (10/13/2024)  Health Literacy: Adequate Health Literacy (08/24/2023)    Readmission Risk Interventions     No data to display

## 2024-10-25 ENCOUNTER — Inpatient Hospital Stay (HOSPITAL_COMMUNITY)

## 2024-10-25 DIAGNOSIS — R109 Unspecified abdominal pain: Secondary | ICD-10-CM | POA: Diagnosis not present

## 2024-10-25 DIAGNOSIS — R935 Abnormal findings on diagnostic imaging of other abdominal regions, including retroperitoneum: Secondary | ICD-10-CM | POA: Diagnosis not present

## 2024-10-25 DIAGNOSIS — R188 Other ascites: Secondary | ICD-10-CM | POA: Diagnosis not present

## 2024-10-25 LAB — CBC WITH DIFFERENTIAL/PLATELET
Abs Immature Granulocytes: 0.12 K/uL — ABNORMAL HIGH (ref 0.00–0.07)
Basophils Absolute: 0 K/uL (ref 0.0–0.1)
Basophils Relative: 0 %
Eosinophils Absolute: 0 K/uL (ref 0.0–0.5)
Eosinophils Relative: 0 %
HCT: 29.4 % — ABNORMAL LOW (ref 36.0–46.0)
Hemoglobin: 9.4 g/dL — ABNORMAL LOW (ref 12.0–15.0)
Immature Granulocytes: 1 %
Lymphocytes Relative: 6 %
Lymphs Abs: 0.6 K/uL — ABNORMAL LOW (ref 0.7–4.0)
MCH: 26.6 pg (ref 26.0–34.0)
MCHC: 32 g/dL (ref 30.0–36.0)
MCV: 83.3 fL (ref 80.0–100.0)
Monocytes Absolute: 0.6 K/uL (ref 0.1–1.0)
Monocytes Relative: 7 %
Neutro Abs: 7.9 K/uL — ABNORMAL HIGH (ref 1.7–7.7)
Neutrophils Relative %: 86 %
Platelets: 267 K/uL (ref 150–400)
RBC: 3.53 MIL/uL — ABNORMAL LOW (ref 3.87–5.11)
RDW: 17.4 % — ABNORMAL HIGH (ref 11.5–15.5)
WBC: 9.3 K/uL (ref 4.0–10.5)
nRBC: 0 % (ref 0.0–0.2)

## 2024-10-25 LAB — PROTIME-INR
INR: 1.2 (ref 0.8–1.2)
Prothrombin Time: 15.7 s — ABNORMAL HIGH (ref 11.4–15.2)

## 2024-10-25 LAB — MAGNESIUM: Magnesium: 2.2 mg/dL (ref 1.7–2.4)

## 2024-10-25 LAB — TYPE AND SCREEN
ABO/RH(D): AB POS
Antibody Screen: NEGATIVE

## 2024-10-25 LAB — COMPREHENSIVE METABOLIC PANEL WITH GFR
ALT: 22 U/L (ref 0–44)
AST: 32 U/L (ref 15–41)
Albumin: 2 g/dL — ABNORMAL LOW (ref 3.5–5.0)
Alkaline Phosphatase: 54 U/L (ref 38–126)
Anion gap: 13 (ref 5–15)
BUN: 6 mg/dL — ABNORMAL LOW (ref 8–23)
CO2: 20 mmol/L — ABNORMAL LOW (ref 22–32)
Calcium: 8.9 mg/dL (ref 8.9–10.3)
Chloride: 103 mmol/L (ref 98–111)
Creatinine, Ser: 1.23 mg/dL — ABNORMAL HIGH (ref 0.44–1.00)
GFR, Estimated: 43 mL/min — ABNORMAL LOW (ref >60)
Glucose, Bld: 103 mg/dL — ABNORMAL HIGH (ref 70–99)
Potassium: 3.3 mmol/L — ABNORMAL LOW (ref 3.5–5.1)
Sodium: 136 mmol/L (ref 135–145)
Total Bilirubin: 1.1 mg/dL (ref 0.0–1.2)
Total Protein: 5.3 g/dL — ABNORMAL LOW (ref 6.5–8.1)

## 2024-10-25 LAB — C-REACTIVE PROTEIN: CRP: 11.4 mg/dL — ABNORMAL HIGH (ref <1.0)

## 2024-10-25 LAB — PROCALCITONIN: Procalcitonin: 0.34 ng/mL

## 2024-10-25 MED ORDER — FENTANYL CITRATE (PF) 100 MCG/2ML IJ SOLN
INTRAMUSCULAR | Status: AC | PRN
  Administered 2024-10-25: 50 ug via INTRAVENOUS

## 2024-10-25 MED ORDER — MIDAZOLAM HCL (PF) 2 MG/2ML IJ SOLN
INTRAMUSCULAR | Status: AC | PRN
  Administered 2024-10-25: 1 mg via INTRAVENOUS

## 2024-10-25 MED ORDER — MIDAZOLAM HCL 2 MG/2ML IJ SOLN
INTRAMUSCULAR | Status: AC
Start: 2024-10-25 — End: 2024-10-25
  Filled 2024-10-25: qty 2

## 2024-10-25 MED ORDER — FENTANYL CITRATE (PF) 100 MCG/2ML IJ SOLN
INTRAMUSCULAR | Status: AC
  Filled 2024-10-25: qty 2

## 2024-10-25 MED ORDER — POTASSIUM CHLORIDE 20 MEQ PO PACK: 20.0000 meq | PACK | Freq: Once | ORAL | Status: DC

## 2024-10-25 MED ORDER — POTASSIUM CHLORIDE CRYS ER 20 MEQ PO TBCR
20.0000 meq | EXTENDED_RELEASE_TABLET | Freq: Once | ORAL | Status: AC
  Administered 2024-10-25: 20 meq via ORAL
  Filled 2024-10-25: qty 1

## 2024-10-25 MED ORDER — POTASSIUM CHLORIDE CRYS ER 20 MEQ PO TBCR
40.0000 meq | EXTENDED_RELEASE_TABLET | Freq: Once | ORAL | Status: AC
  Administered 2024-10-25: 40 meq via ORAL
  Filled 2024-10-25: qty 2

## 2024-10-25 NOTE — Plan of Care (Signed)

## 2024-10-25 NOTE — Progress Notes (Addendum)
   10/25/24 1136  Mobility  Activity Ambulated with assistance  Level of Assistance Standby assist, set-up cues, supervision of patient - no hands on  Assistive Device None  Distance Ambulated (ft) 15 ft  Activity Response Tolerated fair  Mobility Referral Yes  Mobility visit 1 Mobility  Mobility Specialist Start Time (ACUTE ONLY) 1136  Mobility Specialist Stop Time (ACUTE ONLY) 1143  Mobility Specialist Time Calculation (min) (ACUTE ONLY) 7 min   Mobility Specialist: Progress Note  Pt agreeable to mobility session - received in chair. Pt was asymptomatic throughout session with no complaints. Returned to ambulating with PT with all needs met - call bell within reach.   Additional comments: Pt unhooked from tele, remained off tele with PT  Virgle Boards, BS Mobility Specialist Please contact via SecureChat or  Rehab office at [phone].

## 2024-10-25 NOTE — Consult Note (Signed)
 Chief Complaint: Patient was seen in consultation today for abdominal fluid collection, with consideration for drainage.  Referring Provider(s): Ms. Almarie Pringle, PA-C   Supervising Physician: Jennefer Rover  Patient Status: [facility] - In-pt  Patient is Full Code  History of Present Illness: Haley Randall is a 88 y.o. female  with PMHx notable for hypertension, HLD, gout.  IR was requested to evaluate patient for possible percutaneous cholecystostomy and/or RUQ fluid collection aspiration/drain placement.    HIDA completed 10/24/24 which was negative for acute cholecystitis. Dr. Jennefer reviewed imaging and patient has an enlarging right retroperitoneal fluid collection seen incompletely on imaging from 11/13 to 11/22. Dr. Jennefer did recommend a repeat CT AP without contrast to get a full picture of the fluid. Dr. Jennefer also recommended a Surgery consult on this patient to determine the most appropriate plan of care.   Surgery did evaluate patient, and saw no role for acute surgical intervention. Recommended IR drainage.   Interventional Radiology was again requested for abdominal fluid collection drainage. Request was reviewed again and approved by Dr. Jennefer. Patient is tentatively scheduled for same in IR today.   Patient is alert and laying in bed, calm. Her three sisters and niece are at her bedside. Patient is currently without any significant complaints. She states her abdomen does not hurt currently, but does at times with movement. Patient denies any fevers, headache, chest pain, SOB, cough, abdominal pain, nausea, vomiting or bleeding.     Past Medical History:  Diagnosis Date   Chest pain    Dizziness    Hyperlipidemia    Hypertension    Idiopathic gout of foot 08/30/2020    Past Surgical History:  Procedure Laterality Date   BREAST SURGERY Left 1980   biopsy    Allergies: Lisinopril   Medications: Prior to Admission medications   Not on File      Family History  Problem Relation Age of Onset   Heart disease Mother    Heart disease Father    Stroke Maternal Grandmother    Stroke Maternal Grandfather    Heart disease Paternal Grandmother    Heart disease Paternal Grandfather    Cancer Neg Hx    Diabetes Neg Hx     Social History   Socioeconomic History   Marital status: Married    Spouse name: Insurance Account Manager   Number of children: 1   Years of education: Not on file   Highest education level: Not on file  Occupational History   Occupation: retired  Tobacco Use   Smoking status: Former   Smokeless tobacco: Never   Tobacco comments:    quit 30 years ago  Vaping Use   Vaping status: Never Used  Substance and Sexual Activity   Alcohol use: Not Currently   Drug use: No   Sexual activity: Not Currently  Other Topics Concern   Not on file  Social History Narrative   05/23/22   From: originally from [place], then [place]    Living: with husband education officer, environmental) and grandson   Work: retired - book keeping      Family: one adopted daughter - [name]      Enjoys: nothing currently      Exercise: not currently   Diet: avoids certain foods - seafood, red meat      Safety   Seat belts: Yes    Guns: No   Safe in relationships: Yes       Social Drivers of Corporate Investment Banker  Strain: Low Risk  (08/24/2023)   Overall Financial Resource Strain (CARDIA)    Difficulty of Paying Living Expenses: Not hard at all  Food Insecurity: No Food Insecurity (10/13/2024)   Hunger Vital Sign    Worried About Running Out of Food in the Last Year: Never true    Ran Out of Food in the Last Year: Never true  Transportation Needs: No Transportation Needs (10/13/2024)   PRAPARE - Administrator, Civil Service (Medical): No    Lack of Transportation (Non-Medical): No  Physical Activity: Inactive (08/24/2023)   Exercise Vital Sign    Days of Exercise per Week: 0 days    Minutes of Exercise per Session: 0 min  Stress: No Stress Concern  Present (08/24/2023)   [employer] of Occupational Health - Occupational Stress Questionnaire    Feeling of Stress : Not at all  Social Connections: Socially Isolated (10/13/2024)   Social Connection and Isolation Panel    Frequency of Communication with Friends and Family: Once a week    Frequency of Social Gatherings with Friends and Family: Once a week    Attends Religious Services: Never    Database Administrator or Organizations: No    Attends Banker Meetings: Never    Marital Status: Widowed     Review of Systems: A 12 point ROS discussed and pertinent positives are indicated in the HPI above.  All other systems are negative.  Vital Signs: BP (!) 140/47 (BP Location: Left Arm)   Pulse 61   Temp 97.7 F (36.5 C) (Oral)   Resp 18   Ht 4' 11 (1.499 m)   Wt 128 lb 4.9 oz (58.2 kg)   SpO2 94%   BMI 25.91 kg/m   Advance Care Plan: The advanced care place/surrogate decision maker was discussed at the time of visit and the patient did not wish to discuss or was not able to name a surrogate decision maker or provide an advance care plan.  Physical Exam Constitutional:      General: She is not in acute distress.    Appearance: Normal appearance.  HENT:     Mouth/Throat:     Mouth: Mucous membranes are moist.     Pharynx: Oropharynx is clear.  Cardiovascular:     Rate and Rhythm: Normal rate and regular rhythm.     Heart sounds: Normal heart sounds. No murmur heard. Pulmonary:     Effort: Pulmonary effort is normal.     Breath sounds: Normal breath sounds. No wheezing.  Abdominal:     General: Abdomen is flat. There is no distension.     Palpations: Abdomen is soft.     Tenderness: There is no abdominal tenderness.  Musculoskeletal:        General: Normal range of motion.  Skin:    General: Skin is warm and dry.  Neurological:     Mental Status: She is alert and oriented to person, place, and time.  Psychiatric:        Mood and Affect: Mood  normal.        Behavior: Behavior normal.        Thought Content: Thought content normal.        Judgment: Judgment normal.     Imaging: CT ABDOMEN PELVIS WO CONTRAST Result Date: 10/25/2024 EXAM: CT ABDOMEN AND PELVIS WITHOUT CONTRAST 10/25/2024 10:34:25 AM TECHNIQUE: CT of the abdomen and pelvis was performed without the administration of intravenous contrast. Multiplanar reformatted images are  provided for review. Automated exposure control, iterative reconstruction, and/or weight-based adjustment of the mA/kV was utilized to reduce the radiation dose to as low as reasonably achievable. COMPARISON: MRI 10/22/2024, CT 10/13/2024. CLINICAL HISTORY: Abdominal pain, acute, nonlocalized; Intra-abdominal fluid collection nonspecific source. FINDINGS: LOWER CHEST: Moderate right pleural effusion unchanged from MRI. LIVER: Hypodensities in the right hepatic lobe again noted. GALLBLADDER AND BILE DUCTS: Gallbladder is grossly normal on noncontrast exam. No biliary ductal dilatation. SPLEEN: No acute abnormality. PANCREAS: No acute abnormality. ADRENAL GLANDS: No acute abnormality. KIDNEYS, URETERS AND BLADDER: No stones in the kidneys or ureters. No hydronephrosis. No perinephric or periureteral stranding. Urinary bladder is unremarkable. GI AND BOWEL: Stomach demonstrates no acute abnormality. There is no bowel obstruction. PERITONEUM AND RETROPERITONEUM: Fluid collection along the right paracolic gutter extending from the inferior margin of the right hepatic lobe into the iliac fossa. Low density fluid. The dimensions are similar to comparison MRI. There is some organization noted on comparison MRI. The greatest volume in axial dimension is within the iliac fossa on the right measuring 6.7 x 8.6 cm. Small amount of fluid in the pelvis. No free air. VASCULATURE: Aorta is normal in caliber. LYMPH NODES: No lymphadenopathy. REPRODUCTIVE ORGANS: Uterus is no longer normal. BONES AND SOFT TISSUES: No acute osseous  abnormality. No focal soft tissue abnormality. IMPRESSION: 1. Persistent low density fluid collection in the right pericardial gutter similar to comparison MRI with the greatest dimension of volume within the right iliac fossa. On comparison MRI the collection appeared mildly complex and organized. No iv contrast on current exam. 2. Moderate volume right pleural effusion similar to prior. 3. Gallbladder grossly normal on noncontrast exam. Electronically signed by: Norleen Boxer MD 10/25/2024 11:17 AM EST RP Workstation: [id number]   NM Hepatobiliary Liver Func Result Date: 10/24/2024 EXAM: NM HEPATOBILLARY SCAN 10/24/2024 05:35:58 PM TECHNIQUE: RADIOPHARMACEUTICAL: 5.5 mCi Tc-56m mebrofenin  Anterior planar images of the abdomen and pelvis were obtained over approximately 1 hour. 2.3 mg Morphine  given intravenously  Dynamic imaging of the abdomen is obtained COMPARISON: MRI 10/22/2024, ultrasound 10/13/2024 CLINICAL HISTORY: Biliary colic, recurrent, gallbladder dyskinesia suspected FINDINGS: Homogenous uptake within the liver. Normal clearance of the blood pool. Appropriate excretion into the biliary system. Activity is visualized in the small bowel. Activity is visualized in the gallbladder following morphine  administration IMPRESSION: 1. Negative for acute cholecystitis ( gallbladder visualized after morphine  administration ) 2. Normal biliary to bowel transit. Electronically signed by: Luke Bun MD 10/24/2024 06:54 PM EST RP Workstation: [id number]   MR LIVER W WO CONTRAST Result Date: 10/22/2024 EXAM: MRI Abdomen with and without Contrast 10/22/2024 11:12:14 AM TECHNIQUE: Multiplanar multisequence MRI of the abdomen was performed with and without the administration of intravenous contrast. 6 mL (gadobutrol  (GADAVIST ) 1 MMOL/ML injection 6 mL GADOBUTROL  1 MMOL/ML IV SOLN). COMPARISON: MRI 10/14/2024. CLINICAL HISTORY: FINDINGS: LIVER: Peripheral, multicystic enhancing process within segment 7. Imaging  findings are favored to represent cystic dilatation of peripheral biliary radicles. No well-defined drainable abscess identified. Surrounding hyperemia on the arterial phase images noted with mural enhancing dilated biliary radicles extending up to this area noted. On the current exam, the affected area measures approximately 5.6 x 3.5 x 6.3 cm (image 27/100). On the previous exam, this area measured 6.3 x 3.8 x 6.9 cm. No new abnormalities identified within the liver. GALLBLADDER AND BILIARY SYSTEM: New gallbladder wall edema is noted with wall thickness measuring up to 6 mm with signs of pericholecystic edema/fluid. No gallstones identified. Common bile duct measures up to 6  mm. No signs of choledocholithiasis. No intrahepatic ductal dilation. SPLEEN: Normal appearance of the spleen. PANCREAS: No signs of pancreatic main duct dilatation or mass. ADRENAL GLANDS: Unremarkable. KIDNEYS: Bosniak class 1 cyst identified within the inferior pole of the left kidney measuring up to 1.6 cm (image 26/4). LYMPH NODES: No adenopathy identified. VASCULATURE: Extensive aortic atherosclerotic calcifications. PERITONEUM: Progressive complex fluid accumulation within the right hemiabdomen extends from the gallbladder into the right iliac fossa .This collection of fluid measures approximately 8.2 x 6.9 x 20.2 cm. Overlying enhancement is noted involving the peritoneal reflection with signs suggestive of early loculation. The fluid shows mixed signal intensity but is predominantly increased on the T1 weighted sequences and increased on the T2-weighted sequences which may be seen with a biloma and/or hematoma. BOWEL: As noted previously, the duodenum appears edematous with wall thickening. The duodenum does not cross the midline consistent with a malrotation deformity. ABDOMINAL WALL: Mild body wall edema noted. BONES: No abnormal enhancement identified within the visualized osseous structures. No acute abnormality. LUNGS/PLEURAL  SPACES: Interval increase in volume of right pleural effusion, which is now moderate in volume, with overlying atelectasis or consolidation. IMPRESSION: 1. Progressive complex fluid accumulation within the right hemiabdomen with early loculation. The complex fluid shows increased T1 signal, which may be seen with underlying concentrated bile duct, blood products or proteinaceous material. A nuclear medicine HIDA scan may be helpful to exclude bile leak. 2. New gallbladder wall edema with pericholecystic fluid. No gallstones identified. 3. Persistent area of hyperemia surrounding multiple cystic areas in the periphery of segment 7 and dome of liver which may re represent multiple dilated biliary radicles in the setting of cholangitis. No discrete drainable abscess or mass noted. 4. Interval increase in volume of right pleural effusion, now moderate in volume, with overlying atelectasis or consolidation. Electronically signed by: Waddell Calk MD 10/22/2024 12:00 PM EST RP Workstation: [id number]   CT LIVER MASS BIOPSY Result Date: 10/17/2024 INDICATION: The patient has a right lobe liver mass which is difficult to see ultrasound as well as CT without IV contrast. Planned CT-guided biopsy. EXAM: CT-guided biopsy TECHNIQUE: Multidetector CT imaging of the abdomen was performed following the standard protocol without IV contrast. RADIATION DOSE REDUCTION: This exam was performed according to the departmental dose-optimization program which includes automated exposure control, adjustment of the mA and/or kV according to patient size and/or use of iterative reconstruction technique. MEDICATIONS: None. ANESTHESIA/SEDATION: Moderate (conscious) sedation was employed during this procedure. A total of Versed  0.5 mg and Fentanyl  25 mcg was administered intravenously by the radiology nurse. Total intra-service moderate Sedation Time: 30 minutes. The patient's level of consciousness and vital signs were monitored  continuously by radiology nursing throughout the procedure under my direct supervision. COMPLICATIONS: None immediate. PROCEDURE: Informed written consent was obtained from the patient after a thorough discussion of the procedural risks, benefits and alternatives. All questions were addressed. Maximal Sterile Barrier Technique was utilized including caps, mask, sterile gowns, sterile gloves, sterile drape, hand hygiene and skin antiseptic. A timeout was performed prior to the initiation of the procedure. With the patient in a right anterior oblique position axial images of the liver were obtained. The liver was evaluated and measurements were created. Radiopaque markers were then placed on the patient's right upper quadrant and axial imaging was performed in order to further evaluate percutaneous access. The patient's skin was then marked, prepped, and draped in usual sterile fashion. Local anesthesia was achieved with 1% lidocaine . A small incision was made in  the right upper quadrant and an access needle was then advanced through the incision into the liver capsule. Repeat imaging demonstrated needle position in satisfactory position. The stylet was removed and 2 core needle biopsies were then performed of the suspected lesion in the right upper quadrant lateral aspect of the right lobe. All needles were then removed from the patient sterile dressing was applied. Samples were sent to pathology for further evaluation. IMPRESSION: Satisfactory needle core biopsy under CT guidance of a right lobe liver mass. Electronically Signed   By: Cordella Banner   On: 10/17/2024 10:01   MR 3D Recon At Scanner Result Date: 10/17/2024 CLINICAL DATA:  Pancreatitis.  Liver lesions. EXAM: MRI ABDOMEN WITHOUT AND WITH CONTRAST (INCLUDING MRCP) TECHNIQUE: Multiplanar multisequence MR imaging of the abdomen was performed both before and after the administration of intravenous contrast. Heavily T2-weighted images of the biliary and  pancreatic ducts were obtained, and three-dimensional MRCP images were rendered by post processing. CONTRAST:  5mL GADAVIST  GADOBUTROL  1 MMOL/ML IV SOLN COMPARISON:  CT scan and abdominal ultrasound from 1 day prior. FINDINGS: Lower chest: Right pleural effusion. Hepatobiliary: Peripheral multicystic enhancing lesions are identified in the liver. 3.6 x 3.0 cm lesion in the hepatic dome seen on image 15/1001. Lateral right hepatic lobe lesion seen on 27/1 1,001 measures 6.3 x 3.8 cm. Mild periportal edema. No substantial intrahepatic biliary duct dilatation. Common duct measures 7 mm in the hepatoduodenal ligament with common bile duct measuring on the order of 4 mm in the pancreatic head. MRCP imaging is markedly motion degraded with no definite choledocholithiasis. Pancreas: No main duct dilatation. No focal pancreatic mass lesion. Pancreas does not appear frankly edematous by MRI. Spleen:  No splenomegaly. No suspicious focal mass lesion. Adrenals/Urinary Tract: No adrenal nodule or mass. Right kidney unremarkable. Multiple simple cysts are identified in the left kidney, lower pole predominant. Stomach/Bowel: Stomach is unremarkable. No gastric wall thickening. No evidence of outlet obstruction. Duodenum appears edematous with wall thickening. As noted on previous CT, the duodenum does not cross the midline as expected, tracking down towards the pelvis anterior to the IVC. No obvious twisting or swirling of the duodenum and stomach is nondilated. The edema/inflammation in the right upper quadrant along the duodenum, gallbladder fossa, and hepatic flexure is progressive in the interval since the prior CT. Vascular/Lymphatic: Atherosclerotic disease characterizes the abdominal aorta. IVC is collapsed but appears patent. This may be related to hypovolemia or mass-effect from the edema/inflammation in the right upper quadrant and right retroperitoneal tissues. Main portal vein and bifurcation into the left and right  portal vein patent. Splenic vein is diminutive but appears to be patent. There is mass-effect on the portal splenic confluence but the SMV does opacified. Other: Extensive edema is identified in the retroperitoneal anatomy of the right abdomen, extending down towards the pelvis and involving the musculature of the lateral right abdominal wall. Musculoskeletal: No focal suspicious marrow enhancement within the visualized bony anatomy. IMPRESSION: 1. Peripheral multicystic enhancing lesions in the liver. Imaging features raise concern for intrahepatic abscesses. Metastatic disease or primary hepatic neoplasm not excluded. 2. Edema/inflammation in the right upper quadrant along the duodenum, gallbladder fossa, and hepatic flexure is progressive in the interval since the prior CT. Increasing right pleural effusion since prior CT. 3. Duodenum does not cross the midline as expected and appears edematous with wall thickening. No obvious twisting or swirling of the duodenum and stomach is nondilated. Imaging features compatible with component of malrotation although the SMA/SMV orientation  appears appropriate 4. Gallbladder is distended with pericholecystic fluid. No definite cholelithiasis. 5. IVC is collapsed but appears patent. This may be related to hypovolemia or mass-effect from the edema/inflammation in the right upper quadrant/right retroperitoneal tissues. 6. No substantial intrahepatic biliary duct dilatation. Common duct measures 7 mm in the hepatoduodenal ligament with common bile duct measuring on the order of 4 mm in the pancreatic head. MRCP imaging is markedly motion degraded with no definite choledocholithiasis. 7. No main pancreatic duct dilatation. No focal pancreatic mass lesion. Pancreas does not appear overtly edematous by MRI. These results will be called to the ordering clinician or representative by the Radiologist Assistant, and communication documented in the PACS or [employer].  Electronically Signed   By: Camellia Candle M.D.   On: 10/17/2024 08:09   MR ABDOMEN MRCP W WO CONTAST Result Date: 10/15/2024 CLINICAL DATA:  Pancreatitis.  Liver lesions. EXAM: MRI ABDOMEN WITHOUT AND WITH CONTRAST (INCLUDING MRCP) TECHNIQUE: Multiplanar multisequence MR imaging of the abdomen was performed both before and after the administration of intravenous contrast. Heavily T2-weighted images of the biliary and pancreatic ducts were obtained, and three-dimensional MRCP images were rendered by post processing. CONTRAST:  5mL GADAVIST  GADOBUTROL  1 MMOL/ML IV SOLN COMPARISON:  CT scan and abdominal ultrasound from 1 day prior. FINDINGS: Lower chest: Right pleural effusion. Hepatobiliary: Peripheral multicystic enhancing lesions are identified in the liver. 3.6 x 3.0 cm lesion in the hepatic dome seen on image 15/1001. Lateral right hepatic lobe lesion seen on 27/1 1,001 measures 6.3 x 3.8 cm. Mild periportal edema. No substantial intrahepatic biliary duct dilatation. Common duct measures 7 mm in the hepatoduodenal ligament with common bile duct measuring on the order of 4 mm in the pancreatic head. MRCP imaging is markedly motion degraded with no definite choledocholithiasis. Pancreas: No main duct dilatation. No focal pancreatic mass lesion. Pancreas does not appear frankly edematous by MRI. Spleen:  No splenomegaly. No suspicious focal mass lesion. Adrenals/Urinary Tract: No adrenal nodule or mass. Right kidney unremarkable. Multiple simple cysts are identified in the left kidney, lower pole predominant. Stomach/Bowel: Stomach is unremarkable. No gastric wall thickening. No evidence of outlet obstruction. Duodenum appears edematous with wall thickening. As noted on previous CT, the duodenum does not cross the midline as expected, tracking down towards the pelvis anterior to the IVC. No obvious twisting or swirling of the duodenum and stomach is nondilated. The edema/inflammation in the right upper quadrant  along the duodenum, gallbladder fossa, and hepatic flexure is progressive in the interval since the prior CT. Vascular/Lymphatic: Atherosclerotic disease characterizes the abdominal aorta. IVC is collapsed but appears patent. This may be related to hypovolemia or mass-effect from the edema/inflammation in the right upper quadrant and right retroperitoneal tissues. Main portal vein and bifurcation into the left and right portal vein patent. Splenic vein is diminutive but appears to be patent. There is mass-effect on the portal splenic confluence but the SMV does opacified. Other: Extensive edema is identified in the retroperitoneal anatomy of the right abdomen, extending down towards the pelvis and involving the musculature of the lateral right abdominal wall. Musculoskeletal: No focal suspicious marrow enhancement within the visualized bony anatomy. IMPRESSION: 1. Peripheral multicystic enhancing lesions in the liver. Imaging features raise concern for intrahepatic abscesses. Metastatic disease or primary hepatic neoplasm not excluded. 2. Edema/inflammation in the right upper quadrant along the duodenum, gallbladder fossa, and hepatic flexure is progressive in the interval since the prior CT. Increasing right pleural effusion since prior CT. 3. Duodenum  does not cross the midline as expected and appears edematous with wall thickening. No obvious twisting or swirling of the duodenum and stomach is nondilated. Imaging features compatible with component of malrotation although the SMA/SMV orientation appears appropriate 4. Gallbladder is distended with pericholecystic fluid. No definite cholelithiasis. 5. IVC is collapsed but appears patent. This may be related to hypovolemia or mass-effect from the edema/inflammation in the right upper quadrant/right retroperitoneal tissues. 6. No substantial intrahepatic biliary duct dilatation. Common duct measures 7 mm in the hepatoduodenal ligament with common bile duct measuring  on the order of 4 mm in the pancreatic head. MRCP imaging is markedly motion degraded with no definite choledocholithiasis. 7. No main pancreatic duct dilatation. No focal pancreatic mass lesion. Pancreas does not appear overtly edematous by MRI. These results will be called to the ordering clinician or representative by the Radiologist Assistant, and communication documented in the PACS or [employer]. Electronically Signed   By: Camellia Candle M.D.   On: 10/15/2024 05:50   US  Abdomen Limited RUQ (LIVER/GB) Result Date: 10/13/2024 CLINICAL DATA:  Right upper quadrant abdominal pain. EXAM: ULTRASOUND ABDOMEN LIMITED RIGHT UPPER QUADRANT COMPARISON:  None Available. FINDINGS: Gallbladder: Gallbladder wall is upper normal to borderline thickened at 3.1 mm. No echogenic gallstones evident. Pericholecystic fluid evident. Sonographer reports no sonographic Murphy sign. Common bile duct: Diameter: 8 mm Liver: Liver lesion seen on CT earlier today not readily evident by ultrasound. Liver appears heterogeneous and echogenic. Portal vein is patent on color Doppler imaging with normal direction of blood flow towards the liver. Other: None. IMPRESSION: 1. Gallbladder wall is upper normal to borderline thickened at 3.1 mm. No echogenic gallstones evident and no sonographic Murphy sign. As on recent CT, pericholecystic fluid noted. Cholecystitis is not excluded. Correlation for signs/symptoms of cholecystitis recommended. Nuclear scintigraphy may prove helpful to further evaluate as clinically warranted. 2. Common bile duct is dilated at 8 mm. Correlation with liver function test recommended. 3. Liver appears heterogeneous and echogenic. This is a nonspecific finding but can be seen in the setting of hepatic steatosis. 4. Liver lesions seen on CT earlier today not readily evident by ultrasound. Electronically Signed   By: Camellia Candle M.D.   On: 10/13/2024 07:27   CT ABDOMEN PELVIS WO CONTRAST Result Date:  10/13/2024 CLINICAL DATA:  Abdominal pain with nausea. EXAM: CT ABDOMEN AND PELVIS WITHOUT CONTRAST TECHNIQUE: Multidetector CT imaging of the abdomen and pelvis was performed following the standard protocol without IV contrast. RADIATION DOSE REDUCTION: This exam was performed according to the departmental dose-optimization program which includes automated exposure control, adjustment of the mA and/or kV according to patient size and/or use of iterative reconstruction technique. COMPARISON:  None Available. FINDINGS: Lower chest: Calcified granuloma noted left lower lobe. Hepatobiliary: Small volume perihepatic free fluid with findings suspicious for periportal edema although assessment is limited by lack of intravenous contrast material. 4.8 x 3.1 cm subtle lesion identified right liver on image 15 of series 3. Additional smaller lesions seen towards the dome of the liver measuring up to 2.1 cm on image 9 of series 3. Gallbladder is distended with features suggesting gallbladder wall thickening. No calcified gallstones evident. There is edema/fluid in the region the gallbladder fossa and hepatoduodenal ligament. Although assessment limited by lack of intravenous contrast material, no gross dilatation of the common bile duct. Pancreas: No definite pancreatic mass.  No main duct dilatation. Spleen: No splenomegaly. No suspicious focal mass lesion. Adrenals/Urinary Tract: No adrenal nodule or mass. Right kidney  unremarkable. Low-density lesions in the left kidney approach water density and are likely cysts. No evidence for hydroureter. Bladder unremarkable. Stomach/Bowel: Stomach is unremarkable. No gastric wall thickening. No evidence of outlet obstruction. Duodenum passes through the area of edema/inflammatory change of the right upper quadrant. Duodenum does not cross the midline as expected suggesting a component of small bowel malrotation. No small bowel wall thickening. No small bowel dilatation. Despite the  course of the duodenum, the cecum is positioned in the right lower quadrant. The terminal ileum is normal. The appendix is normal. There is some fluid adjacent to the distal ascending colon and hepatic flexure. Diverticular disease is noted in the left colon without diverticulitis. Vascular/Lymphatic: There is advanced atherosclerotic calcification of the abdominal aorta without aneurysm. No abdominal lymphadenopathy. No pelvic lymphadenopathy. Reproductive: Uterine fibroids evident.  There is no adnexal mass. Other: No substantial free fluid in the pelvis. Musculoskeletal: No worrisome lytic or sclerotic osseous abnormality. IMPRESSION: 1. 4.8 x 3.1 cm subtle lesion identified right liver with additional smaller lesions seen towards the dome of the liver measuring up to 2.1 cm. Imaging features are indeterminate. MRI of the abdomen with and without contrast recommended to further evaluate as metastatic disease or primary hepatic neoplasm not excluded. 2. Gallbladder is distended with features suggesting ill-defined gallbladder wall thickening on this noncontrast study. No calcified gallstones evident. There is edema/fluid in the region the gallbladder fossa and hepatoduodenal ligament. Imaging features are nonspecific but could be seen in the setting of acute cholecystitis. Right upper quadrant ultrasound may prove helpful to further evaluate. 3. Edema/inflammation is identified along the distal ascending colon and hepatic flexure, potentially secondary to changes described above in the gallbladder fossa. No associated colonic wall thickening to suggest colon represents etiology of these findings, but colitis is not excluded. 4. Small volume perihepatic free fluid with question of periportal edema. 5. Left colonic diverticulosis without diverticulitis. 6. Uterine fibroids. 7.  Aortic Atherosclerosis (ICD10-I70.0). Electronically Signed   By: Camellia Candle M.D.   On: 10/13/2024 06:41   DG Chest Port 1 View Result  Date: 10/13/2024 CLINICAL DATA:  Possible sepsis. EXAM: PORTABLE CHEST 1 VIEW COMPARISON:  None Available. FINDINGS: The lungs are clear without focal pneumonia, edema, pneumothorax or pleural effusion. Biapical pleuroparenchymal scarring evident. Cardiopericardial silhouette is at upper limits of normal for size. No acute bony abnormality. Telemetry leads overlie the chest. IMPRESSION: No active disease. Electronically Signed   By: Camellia Candle M.D.   On: 10/13/2024 05:46    Labs:  CBC: Recent Labs    10/21/24 0255 10/22/24 0320 10/24/24 1123 10/25/24 0020  WBC 14.4* 12.9* 11.8* 9.3  HGB 9.6* 9.0* 11.8* 9.4*  HCT 29.9* 27.8* 35.9* 29.4*  PLT 220 234 297 267    COAGS: Recent Labs    10/13/24 0517 10/17/24 0404 10/24/24 1223 10/25/24 0020  INR 1.1 1.1 1.2 1.2    BMP: Recent Labs    10/21/24 0255 10/22/24 0320 10/24/24 1123 10/25/24 0020  NA 135 140 136 136  K 4.4 3.6 3.3* 3.3*  CL 105 106 100 103  CO2 16* 17* 19* 20*  GLUCOSE 79 82 88 103*  BUN 10 7* 7* 6*  CALCIUM  9.4 9.5 9.3 8.9  CREATININE 1.38* 1.53* 1.24* 1.23*  GFRNONAA 37* 33* 42* 43*    LIVER FUNCTION TESTS: Recent Labs    10/21/24 0255 10/22/24 0320 10/24/24 1123 10/25/24 0020  BILITOT 1.6* 1.5* 1.2 1.1  AST 27 24 37 32  ALT 28 24 27  22  ALKPHOS 80 57 71 54  PROT 5.7* 5.4* 6.9 5.3*  ALBUMIN 2.2* 2.0* 2.5* 2.0*    TUMOR MARKERS: No results for input(s): AFPTM, CEA, CA199, CHROMGRNA in the last 8760 hours.  Assessment and Plan: IR was requested to evaluate patient for possible percutaneous cholecystostomy and/or RUQ fluid collection aspiration/drain placement.    HIDA completed 10/24/24 which was negative for acute cholecystitis. Dr. Jennefer reviewed imaging and patient has an enlarging right retroperitoneal fluid collection seen incompletely on imaging from 11/13 to 11/22. Dr. Jennefer did recommend a repeat CT AP without contrast to get a full picture of the fluid. Dr. Jennefer also  recommended a Surgery consult on this patient to determine the most appropriate plan of care.   Surgery did evaluate patient, and saw no role for acute surgical intervention. Recommended IR drainage.   Interventional Radiology was again requested for abdominal fluid collection drainage. Request was reviewed again and approved by Dr. Jennefer. Patient will tentatively present for abdominal fluid collection drainage in IR today.  Patient has been NPO since at least midnight.  All labs and medications are within acceptable parameters.  Allergies reviewed: Lisinopril .  Risks and benefits discussed with the patient including bleeding, infection, damage to adjacent structures, bowel perforation/fistula connection, and sepsis.  All of the patient's questions were answered, patient is agreeable to proceed. Consent signed and in chart.     Thank you for allowing our service to participate in Inice Sanluis 's care.  Electronically Signed: Carlin DELENA Griffon, PA-C   10/25/2024, 3:26 PM      I spent a total of 40 Minutes in face to face in clinical consultation, greater than 50% of which was counseling/coordinating care for abdominal fluid collection, with consideration for drainage.

## 2024-10-25 NOTE — Procedures (Signed)
 Interventional Radiology Procedure Note  Procedure: CT guided abdominal drain placement   Findings: Please refer to procedural dictation for full description. 10 Fr pigtail into RLQ/retroperitoneal collection.  Approximately 100 mL thin, dark sanguinous fluid aspirated.  Sample sent for culture.  Drain to bulb suction.  Complications: None immediate  Estimated Blood Loss:  < 5 mL  Recommendations: Keep to bulb suction for now. Follow culture. IR will follow.   Ester Sides, MD

## 2024-10-25 NOTE — Progress Notes (Signed)
 PROGRESS NOTE        PATIENT DETAILS Name: Haley Randall Age: 88 y.o. Sex: female Date of Birth: 1935/12/13 Admit Date: 10/13/2024 Admitting Physician [name], MD ERE:Ilhjo, Ginger, FNP  Brief Summary: Patient is a 88 y.o.  female with history of HTN, HLD-who presented with abdominal pain-found to have pancreatitis/vague liver lesions on CT abdomen.  Significant events: 11/13>> admit to [facility]  Significant studies: 11/13>> CXR: No PNA 11/13>> CT abdomen/pelvis: 4.8 x 3.1 cm subtle lesion-right liver-additional small lesions measuring up to 2.1 cm.  Gallbladder distended.  Edema/inflammation along the descending ascending colon/hepatic flexure. 11/13>> RUQ ultrasound: Gallbladder wall upper normal to borderline thickened, no echogenic gallstones.  CBD 8 mm. 11/14>> MRI abdomen: Multicystic enhancing lesions in the liver-abscesses versus metastatic disease 10/17/2024.  CT-guided liver biopsy by IR   10/22/24 - repeat MRCP -  1. Progressive complex fluid accumulation within the right hemiabdomen with early loculation. The complex fluid shows increased T1 signal, which may be seen with underlying concentrated bile duct, blood products or proteinaceous material. A nuclear medicine HIDA scan may be helpful to exclude bile leak. 2. New gallbladder wall edema with pericholecystic fluid. No gallstones identified. 3. Persistent area of hyperemia surrounding multiple cystic areas in the periphery of segment 7 and dome of liver which may re represent multiple dilated biliary radicles in the setting of cholangitis. No discrete drainable abscess or mass noted. 4. Interval increase in volume of right pleural effusion, now moderate in volume, with overlying atelectasis or consolidation.  HIDA scan 10/24/2024.  Nonacute.  CT scan abdomen pelvis noncontrast repeat ordered on 10/25/2024 per IR recommendation.  Significant microbiology data:  11/13>> COVID/influenza/RSV  PCR: Negative 11/13>> urine culture: Multiple species 11/13>> 1/2: Gram-positive cocci (prelim Staph epidermidis)-likely contamination. 11/14>> acute hepatitis serology: Negative 11/14>> RPR: Nonreactive  Consults:  General surgery IR [place] [facility] called x 2, Dr. San.  They have nothing to offer in terms of doing a consultation.  Subjective: Patient in bed, appears comfortable, denies any headache, no fever, no chest pain or pressure, no shortness of breath , no abdominal pain. No focal weakness.   Objective: Vitals: Blood pressure (!) 162/45, pulse 66, temperature (!) 97.3 F (36.3 C), temperature source Oral, resp. rate 16, height 4' 11 (1.499 m), weight 58.2 kg, SpO2 95%.   Exam:  Awake Alert, No new F.N deficits, Normal affect [place].AT,PERRAL Supple Neck, No JVD,   Symmetrical Chest wall movement, Good air movement bilaterally, CTAB RRR,No Gallops, Rubs or new Murmurs,  +ve B.Sounds, Abd Soft, nontender No Cyanosis, Clubbing or edema   Assessment/Plan:  Patient able acute pancreatitis with asymptomatic transaminitis and mildly elevated bilirubin levels, multicystic enhancing liver lesion.  Possible colitis. With supportive care acute pancreatitis completely resolved, no gallstones on imaging, no history of alcohol use, MRCP unremarkable in terms of no choledocholithiasis-no major pancreatic abnormality on MRI.  HIDA scan unremarkable, on empiric antibiotic for questionable colitis and possible abscess.  Has been seen by general surgery and GI, also being evaluated by IR.  Underwent liver biopsy which was nonmalignant, specimen was insufficient for culture or sensitivity.  Patient has been maintained on IV antibiotics, clinically stable, no right upper quadrant pain, MRCP and HIDA scan unrevealing, per general surgery and IR repeating CT abdomen pelvis on 10/25/2024 to check for any fluid collection, general surgery to be reconsulted again on  10/25/2024 per IR  request.  Continue to monitor.  Clinically no pancreatitis, no active evidence of active colitis clinically.   ? Colitis GI on board   Gram-positive bacteremia-coag negative staph (preliminary result)  likely a contaminant, 1 out of 2 blood cultures, other one showed no growth for completion  HTN Systolic blood pressures remain elevated but diastolics remain low which has been a problem in the past No return of tachycardia after starting metoprolol  Hold off on resuming amlodipine  Trial TED stockings, issues with orthostatic hypotension in the past  NAGMA Prerenal AKI on CKD stage IIIa/b Bicarb continues to fluctuate between 16 and 19 Likely 2/2 renal dysfunction, which has been stable at baseline over the past few days after initial AKI Continue to monitor   Normocytic anemia No recent baseline but hemoglobin had been normal over 1 year ago Hemoglobin 11 on admission and dropped to 7.3 on 11/19, s/p 1 unit PRBCs on 11/19 Hemoglobin stable at 9.6 this morning Iron labs most consistent with anemia of chronic disease Continue ferrous sulfate   Hypomagnesemia, hypokalemia, hypophosphatemia Continue to monitor and replete as needed  ?  Mild dementia/cognitive issues B12/RPR all stable Ammonia level is only minimally elevated-doubt of any clinical significance. No confusion this morning-continue to maintain delirium precautions  Elevated TSH TSH 5.8 with T4 1.04.  Likely sick euthyroid and will need repeat TSH in 4 to 6 weeks  Palliative care Previously had long discussion with the patient/daughter regarding findings on MRI that could represent metastatic disease.  Underwent liver biopsy 11/17 and awaiting results but if metastatic disease they seem to be leaning towards stopping further workup but will continue to discuss  Code status:   Code Status: Full Code   DVT Prophylaxis: Place TED hose Start: 10/19/24 0932 enoxaparin  (LOVENOX ) injection 30 mg Start: 10/18/24  1000 Place and maintain sequential compression device Start: 10/16/24 0843   Family Communication: Daughter [name] [phone]  called and updated over the phone on 10/17/2024, 10/25/2024  Disposition Plan: Status is: Observation The patient will require care spanning > 2 midnights and should be moved to inpatient because: Severity of illness   Planned Discharge Destination:Home   Diet: Diet Order             Diet NPO time specified Except for: Sips with Meds  Diet effective midnight                    MEDICATIONS: Scheduled Meds:  amLODipine   10 mg Oral Daily   enoxaparin  (LOVENOX ) injection  30 mg Subcutaneous Daily   feeding supplement  237 mL Oral BID BM   ferrous sulfate   325 mg Oral Q breakfast   folic acid   1 mg Oral Daily   metoprolol  tartrate  50 mg Oral BID   multivitamin with minerals  1 tablet Oral Daily   pantoprazole   40 mg Oral BID   Continuous Infusions:  dextrose  5 % 1,000 mL with potassium chloride  40 mEq infusion 75 mL/hr at 10/25/24 9378   piperacillin -tazobactam (ZOSYN )  IV 3.375 g (10/25/24 0516)   PRN Meds:.acetaminophen , albuterol , diltiazem , fentaNYL  (SUBLIMAZE ) injection, hydrALAZINE , LORazepam , ondansetron  **OR** ondansetron  (ZOFRAN ) IV, sodium chloride  flush   I have personally reviewed following labs and imaging studies  LABORATORY DATA:   Data Review:   Inpatient Medications  Scheduled Meds:  amLODipine   10 mg Oral Daily   enoxaparin  (LOVENOX ) injection  30 mg Subcutaneous Daily   feeding supplement  237 mL Oral BID BM   ferrous sulfate   325 mg Oral Q breakfast   folic acid   1 mg Oral Daily   metoprolol  tartrate  50 mg Oral BID   multivitamin with minerals  1 tablet Oral Daily   pantoprazole   40 mg Oral BID   Continuous Infusions:  dextrose  5 % 1,000 mL with potassium chloride  40 mEq infusion 75 mL/hr at 10/25/24 9378   piperacillin -tazobactam (ZOSYN )  IV 3.375 g (10/25/24 0516)   PRN Meds:.acetaminophen , albuterol ,  diltiazem , fentaNYL  (SUBLIMAZE ) injection, hydrALAZINE , LORazepam , ondansetron  **OR** ondansetron  (ZOFRAN ) IV, sodium chloride  flush  DVT Prophylaxis  Place TED hose Start: 10/19/24 0932 enoxaparin  (LOVENOX ) injection 30 mg Start: 10/18/24 1000 Place and maintain sequential compression device Start: 10/16/24 0843   Recent Labs  Lab 10/20/24 0900 10/21/24 0255 10/22/24 0320 10/24/24 1123 10/25/24 0020  WBC 13.2* 14.4* 12.9* 11.8* 9.3  HGB 10.5* 9.6* 9.0* 11.8* 9.4*  HCT 32.2* 29.9* 27.8* 35.9* 29.4*  PLT 221 220 234 297 267  MCV 82.1 84.2 83.0 81.2 83.3  MCH 26.8 27.0 26.9 26.7 26.6  MCHC 32.6 32.1 32.4 32.9 32.0  RDW 16.4* 17.0* 17.2* 17.2* 17.4*  LYMPHSABS 0.8 0.9 0.7 0.6* 0.6*  MONOABS 1.0 1.1* 0.9 0.5 0.6  EOSABS 0.0 0.0 0.1 0.0 0.0  BASOSABS 0.1 0.1 0.0 0.0 0.0    Recent Labs  Lab 10/19/24 0347 10/20/24 0900 10/21/24 0255 10/22/24 0320 10/24/24 1123 10/24/24 1223 10/25/24 0020  NA 138 136 135 140 136  --  136  K 2.9* 3.4* 4.4 3.6 3.3*  --  3.3*  CL 107 102 105 106 100  --  103  CO2 19* 18* 16* 17* 19*  --  20*  ANIONGAP 12 16* 14 17* 17*  --  13  GLUCOSE 107* 81 79 82 88  --  103*  BUN 11 9 10  7* 7*  --  6*  CREATININE 1.39* 1.34* 1.38* 1.53* 1.24*  --  1.23*  AST 30 27 27 24  37  --  32  ALT 39 34 28 24 27   --  22  ALKPHOS 62 71 80 57 71  --  54  BILITOT 0.9 1.5* 1.6* 1.5* 1.2  --  1.1  ALBUMIN 2.0* 2.3* 2.2* 2.0* 2.5*  --  2.0*  CRP  --   --   --   --  15.7*  --  11.4*  PROCALCITON  --   --   --  0.47 0.19  --  0.34  INR  --   --   --   --   --  1.2 1.2  MG 1.6* 1.9 1.9  --  1.7  --  2.2  PHOS 2.8 2.5 2.1*  --   --   --   --   CALCIUM  8.6* 9.3 9.4 9.5 9.3  --  8.9      Recent Labs  Lab 10/19/24 0347 10/20/24 0900 10/21/24 0255 10/22/24 0320 10/24/24 1123 10/24/24 1223 10/25/24 0020  CRP  --   --   --   --  15.7*  --  11.4*  PROCALCITON  --   --   --  0.47 0.19  --  0.34  INR  --   --   --   --   --  1.2 1.2  MG 1.6* 1.9 1.9  --  1.7  --   2.2  CALCIUM  8.6* 9.3 9.4 9.5 9.3  --  8.9    --------------------------------------------------------------------------------------------------------------- Lab Results  Component Value Date   CHOL 157 09/01/2023   HDL  86 09/01/2023   LDLCALC 57 09/01/2023   TRIG 74 09/01/2023   CHOLHDL 1.8 09/01/2023    Lab Results  Component Value Date   HGBA1C 6.2 10/02/2022   Radiology Reports  NM Hepatobiliary Liver Func Result Date: 10/24/2024 EXAM: NM HEPATOBILLARY SCAN 10/24/2024 05:35:58 PM TECHNIQUE: RADIOPHARMACEUTICAL: 5.5 mCi Tc-27m mebrofenin  Anterior planar images of the abdomen and pelvis were obtained over approximately 1 hour. 2.3 mg Morphine  given intravenously  Dynamic imaging of the abdomen is obtained COMPARISON: MRI 10/22/2024, ultrasound 10/13/2024 CLINICAL HISTORY: Biliary colic, recurrent, gallbladder dyskinesia suspected FINDINGS: Homogenous uptake within the liver. Normal clearance of the blood pool. Appropriate excretion into the biliary system. Activity is visualized in the small bowel. Activity is visualized in the gallbladder following morphine  administration IMPRESSION: 1. Negative for acute cholecystitis ( gallbladder visualized after morphine  administration ) 2. Normal biliary to bowel transit. Electronically signed by: Luke Bun MD 10/24/2024 06:54 PM EST RP Workstation: [id number]     Signature  -    Lavada Stank M.D on 10/25/2024 at 10:00 AM   -  To page go to [email or web address]

## 2024-10-25 NOTE — Progress Notes (Signed)
 [facility] Progress Note     Subjective: CC:  Cc is loose, brown stools every time she goes to the bathroom to urinate. Denies abdominal pain at present. Reports intermittent abdominal pain, described as cramping, that moves around but is usually on her right side. Tolerating PO but reports poor oral intake, decreased appetite, and early satiety.   Objective: Vital signs in last 24 hours: Temp:  [97.1 F (36.2 C)-97.9 F (36.6 C)] 97.4 F (36.3 C) (11/25 0800) Pulse Rate:  [55-82] 67 (11/25 0800) Resp:  [12-20] 20 (11/25 0800) BP: (130-162)/(43-55) 148/43 (11/25 0800) SpO2:  [93 %-96 %] 96 % (11/25 0800) Last BM Date : 10/25/24  Intake/Output from previous day: No intake/output data recorded. Intake/Output this shift: No intake/output data recorded.  PE: Gen:  Alert, NAD, pleasant Card:  Regular rate and rhythm Pulm:  Normal effort ORA  Abd: Soft, non-tender, no significant distention, no HSM, no palpable hernias or masses Skin: warm and dry, no rashes  Psych: A&Ox3   Lab Results:  Recent Labs    10/24/24 1123 10/25/24 0020  WBC 11.8* 9.3  HGB 11.8* 9.4*  HCT 35.9* 29.4*  PLT 297 267   BMET Recent Labs    10/24/24 1123 10/25/24 0020  NA 136 136  K 3.3* 3.3*  CL 100 103  CO2 19* 20*  GLUCOSE 88 103*  BUN 7* 6*  CREATININE 1.24* 1.23*  CALCIUM  9.3 8.9   PT/INR Recent Labs    10/24/24 1223 10/25/24 0020  LABPROT 15.6* 15.7*  INR 1.2 1.2   CMP     Component Value Date/Time   NA 136 10/25/2024 0020   NA 143 09/07/2023 1005   K 3.3 (L) 10/25/2024 0020   CL 103 10/25/2024 0020   CO2 20 (L) 10/25/2024 0020   GLUCOSE 103 (H) 10/25/2024 0020   BUN 6 (L) 10/25/2024 0020   BUN 15 09/07/2023 1005   CREATININE 1.23 (H) 10/25/2024 0020   CALCIUM  8.9 10/25/2024 0020   PROT 5.3 (L) 10/25/2024 0020   PROT 7.0 09/01/2023 0927   ALBUMIN 2.0 (L) 10/25/2024 0020   ALBUMIN 4.4 09/01/2023 0927   AST 32 10/25/2024 0020   ALT 22 10/25/2024 0020    ALKPHOS 54 10/25/2024 0020   BILITOT 1.1 10/25/2024 0020   BILITOT 0.8 09/01/2023 0927   GFRNONAA 43 (L) 10/25/2024 0020   Lipase     Component Value Date/Time   LIPASE >2,800 (H) 10/13/2024 0517       Studies/Results: NM Hepatobiliary Liver Func Result Date: 10/24/2024 EXAM: NM HEPATOBILLARY SCAN 10/24/2024 05:35:58 PM TECHNIQUE: RADIOPHARMACEUTICAL: 5.5 mCi Tc-3m mebrofenin  Anterior planar images of the abdomen and pelvis were obtained over approximately 1 hour. 2.3 mg Morphine  given intravenously  Dynamic imaging of the abdomen is obtained COMPARISON: MRI 10/22/2024, ultrasound 10/13/2024 CLINICAL HISTORY: Biliary colic, recurrent, gallbladder dyskinesia suspected FINDINGS: Homogenous uptake within the liver. Normal clearance of the blood pool. Appropriate excretion into the biliary system. Activity is visualized in the small bowel. Activity is visualized in the gallbladder following morphine  administration IMPRESSION: 1. Negative for acute cholecystitis ( gallbladder visualized after morphine  administration ) 2. Normal biliary to bowel transit. Electronically signed by: Luke Bun MD 10/24/2024 06:54 PM EST RP Workstation: [id number]    Anti-infectives: Anti-infectives (From admission, onward)    Start     Dose/Rate Route Frequency Ordered Stop   10/15/24 2200  piperacillin -tazobactam (ZOSYN ) IVPB 3.375 g        3.375 g 12.5 mL/hr over 240  Minutes Intravenous Every 8 hours 10/15/24 1542     10/14/24 1400  piperacillin -tazobactam (ZOSYN ) IVPB 2.25 g  Status:  Discontinued        2.25 g 100 mL/hr over 30 Minutes Intravenous Every 8 hours 10/14/24 1024 10/15/24 1542   10/13/24 1400  piperacillin -tazobactam (ZOSYN ) IVPB 3.375 g  Status:  Discontinued        3.375 g 100 mL/hr over 30 Minutes Intravenous Every 8 hours 10/13/24 1036 10/13/24 1038   10/13/24 1400  piperacillin -tazobactam (ZOSYN ) IVPB 3.375 g  Status:  Discontinued        3.375 g 12.5 mL/hr over 240 Minutes  Intravenous Every 8 hours 10/13/24 1039 10/14/24 1024   10/13/24 1045  piperacillin -tazobactam (ZOSYN ) IVPB 3.375 g  Status:  Discontinued        3.375 g 12.5 mL/hr over 240 Minutes Intravenous Every 8 hours 10/13/24 1038 10/13/24 1039   10/13/24 0530  piperacillin -tazobactam (ZOSYN ) IVPB 3.375 g        3.375 g 100 mL/hr over 30 Minutes Intravenous  Once 10/13/24 0525 10/13/24 9362        Assessment/Plan Epigastric abdominal pain Acute pancreatitis  Possible liver masses/abscesses noted on CT  - CT 11/13 with subtle lesion in right liver with additional small lesions toward dome of liver, distended gallbladder with pericholecystic fluid and questionable gallbladder wall thickening, no gallstones, inflammation/edema along the distal ascending colon and hepatic flexure without colonic wall thickening , small volume perihepatic free fluid with questionable periportal edema  - RUQ US  11/13 with gallbladder wall borderline thickening, no gallstones, no sonographic murphy sign, pericholecystic fluid present, CBD dilated to 8 mm, liver heterogeneous and echogenic, liver lesions not readily evident  - 11/15 MRCP with multicystic liver lesions, RUQ edema, no cholelithiasis, no biliary dilation, no overt pancreatic edema - 11/17 CT-guided liver biopsy by IR (nonspecific inflammation and reactive changes, no malignancy) - 11/22 MR liver with interval/progressing fluid collection in the right hemiabdomen, gallbladder wall edema, No gallstones, multiple cystic areas in segment 7/dome of liver, possible dilated biliary radicles, right pleural effusion.  - 11/24 HIDA negative for bile leak/cholecystitis  - IR consulted and recommended CT scan of the abdomen and pelvis to more fully evaluate intra-abdominal fluid.  - increasing, right-sided intra-abdominal fluid of unclear etiology. Differential includes ascites from pancreatitis, bile leak that was not apparent on HIDA, hematoma from liver biopsy above,  sequela of right-sided colitis (felt less likely). No role for acute surgical intervention. Recommend IR drainage to assess quality of fluid and send for culture/cytology.  - per [facility] -  HTN HLD CKD stage IIIb   LOS: 11 days   I reviewed nursing notes, Consultant IR notes, hospitalist notes, last 24 h vitals and pain scores, last 48 h intake and output, last 24 h labs and trends, and last 24 h imaging results.  This care required moderate level of medical decision making.   Almarie Pringle, PA-C [facility] Please see Amion for pager number during day hours 7:00am-4:30pm

## 2024-10-25 NOTE — Progress Notes (Signed)
 IR asked to evaluate patient for possible percutaneous cholecystostomy and/or RUQ fluid collection aspiration/drain placement.   HIDA completed 10/24/24 which was negative for acute cholecystitis. Dr. Jennefer reviewed imaging and patient has an enlarging right retroperitoneal fluid collection seen incompletely on imaging from 11/13 to 11/22. Dr. Jennefer recommends a repeat CT AP (ok without contrast) to get a full picture of the fluid. Dr. Jennefer is also recommending that Surgery consult on this patient and determine the most appropriate plan of care.   This information was shared with Dr. Dennise via Epic chat and he has ordered the CT study. Surgery has also been consulted.   No current plans for IR procedure and the order will be deleted. IR will await further recommendations from Surgery.   Warren Dais, AGACNP-BC 10/25/2024, 10:51 AM

## 2024-10-25 NOTE — Progress Notes (Addendum)
   10/25/24 0823  Mobility  Activity Pivoted/transferred from bed to chair  Level of Assistance Contact guard assist, steadying assist  Assistive Device None  Activity Response Tolerated fair  Mobility Referral Yes  Mobility visit 1 Mobility  Mobility Specialist Start Time (ACUTE ONLY) [phone]  Mobility Specialist Stop Time (ACUTE ONLY) 0831  Mobility Specialist Time Calculation (min) (ACUTE ONLY) 8 min   Mobility Specialist: Progress Note  Post-Mobility:    HR 68, SpO2 96% RA  Pt agreeable to mobility session - received in bed. Pt was asymptomatic throughout session with no complaints. Returned to chair with all needs met - call bell within reach. Chair alarm on.   Additional comments: Pt opted to sit in chair. Pt seen for additional visit, used BR, pericare ind. Left with PT.  Virgle Boards, BS Mobility Specialist Please contact via SecureChat or  Rehab office at [phone].

## 2024-10-25 NOTE — Progress Notes (Signed)
 Physical Therapy Treatment Patient Details Name: Haley Randall MRN: [id number] DOB: 1936/01/29 Today's Date: 10/25/2024   History of Present Illness 88 y.o. female adm 10/13/24 with medical history significant of hypertension, hyperlipidemia, and gout presents with abdominal cramps.  Admitted for abdominal cramps.    PT Comments  Pt tolerated treatment well today. Pt with similar presentation to previous session. Pt still requires cues for locking brakes on rollator however able to ambulate in hallway and navigate stairs with supervision. No change in DC/DME recs at this time. PT will continue to follow.     If plan is discharge home, recommend the following: A little help with walking and/or transfers;A little help with bathing/dressing/bathroom;Assistance with cooking/housework;Help with stairs or ramp for entrance;Assist for transportation   Can travel by private Psychologist, Clinical (4 wheels)    Recommendations for Other Services       Precautions / Restrictions Precautions Precautions: Fall Recall of Precautions/Restrictions: Intact Restrictions Weight Bearing Restrictions Per Provider Order: No     Mobility  Bed Mobility Overal bed mobility: Needs Assistance Bed Mobility: Sit to Supine       Sit to supine: Modified independent (Device/Increase time)   General bed mobility comments: Up in bathroom    Transfers Overall transfer level: Modified independent Equipment used: Rollator (4 wheels) Transfers: Sit to/from Stand Sit to Stand: Modified independent (Device/Increase time)           General transfer comment: Able to stand and perform pericare in bathroom independently.    Ambulation/Gait Ambulation/Gait assistance: Supervision Gait Distance (Feet): 375 Feet Assistive device: Rollator (4 wheels) Gait Pattern/deviations: Narrow base of support, Scissoring, Decreased stride length, Step-through pattern Gait velocity:  decreased     General Gait Details: Pt required education on rollator brake management again. Overall steady with improved gait speed compared to previous session. 2 seated rest breaks before and after stairs.   Stairs Stairs: Yes Stairs assistance: Contact guard assist Stair Management: Alternating pattern, Sideways, One rail Left, One rail Right, Forwards Number of Stairs: 5 General stair comments: Able to ascend forwards and descend sideways.   Wheelchair Mobility     Tilt Bed    Modified Rankin (Stroke Patients Only)       Balance Overall balance assessment: Needs assistance Sitting-balance support: Feet supported Sitting balance-Leahy Scale: Normal     Standing balance support: Reliant on assistive device for balance Standing balance-Leahy Scale: Fair                              Hotel Manager: No apparent difficulties  Cognition Arousal: Alert Behavior During Therapy: WFL for tasks assessed/performed                             Following commands: Intact      Cueing Cueing Techniques: Verbal cues  Exercises      General Comments General comments (skin integrity, edema, etc.): VSS      Pertinent Vitals/Pain Pain Assessment Pain Assessment: No/denies pain    Home Living                          Prior Function            PT Goals (current goals can now be found in the care plan section) Progress towards PT goals: Progressing  toward goals    Frequency    Min 2X/week      PT Plan      Co-evaluation              AM-PAC PT 6 Clicks Mobility   Outcome Measure  Help needed turning from your back to your side while in a flat bed without using bedrails?: A Little Help needed moving from lying on your back to sitting on the side of a flat bed without using bedrails?: A Little Help needed moving to and from a bed to a chair (including a wheelchair)?: A Little Help needed  standing up from a chair using your arms (e.g., wheelchair or bedside chair)?: A Little Help needed to walk in hospital room?: A Little Help needed climbing 3-5 steps with a railing? : A Little 6 Click Score: 18    End of Session Equipment Utilized During Treatment: Gait belt Activity Tolerance: Patient tolerated treatment well Patient left: in chair;with call bell/phone within reach;with chair alarm set Nurse Communication: Mobility status PT Visit Diagnosis: Other abnormalities of gait and mobility (R26.89)     Time: 1142-1200 PT Time Calculation (min) (ACUTE ONLY): 18 min  Charges:    $Gait Training: 8-22 mins PT General Charges $$ ACUTE PT VISIT: 1 Visit                     Haley Randall, PT, DPT Acute Rehab Services [phone]    Haley Randall 10/25/2024, 4:06 PM

## 2024-10-26 DIAGNOSIS — R935 Abnormal findings on diagnostic imaging of other abdominal regions, including retroperitoneum: Secondary | ICD-10-CM | POA: Diagnosis not present

## 2024-10-26 DIAGNOSIS — R188 Other ascites: Secondary | ICD-10-CM | POA: Diagnosis not present

## 2024-10-26 DIAGNOSIS — R109 Unspecified abdominal pain: Secondary | ICD-10-CM | POA: Diagnosis not present

## 2024-10-26 LAB — CBC WITH DIFFERENTIAL/PLATELET
Abs Immature Granulocytes: 0.08 K/uL — ABNORMAL HIGH (ref 0.00–0.07)
Basophils Absolute: 0 K/uL (ref 0.0–0.1)
Basophils Relative: 0 %
Eosinophils Absolute: 0 K/uL (ref 0.0–0.5)
Eosinophils Relative: 1 %
HCT: 29.4 % — ABNORMAL LOW (ref 36.0–46.0)
Hemoglobin: 9.6 g/dL — ABNORMAL LOW (ref 12.0–15.0)
Immature Granulocytes: 1 %
Lymphocytes Relative: 11 %
Lymphs Abs: 0.7 K/uL (ref 0.7–4.0)
MCH: 26.9 pg (ref 26.0–34.0)
MCHC: 32.7 g/dL (ref 30.0–36.0)
MCV: 82.4 fL (ref 80.0–100.0)
Monocytes Absolute: 0.7 K/uL (ref 0.1–1.0)
Monocytes Relative: 11 %
Neutro Abs: 5.1 K/uL (ref 1.7–7.7)
Neutrophils Relative %: 76 %
Platelets: 307 K/uL (ref 150–400)
RBC: 3.57 MIL/uL — ABNORMAL LOW (ref 3.87–5.11)
RDW: 17.3 % — ABNORMAL HIGH (ref 11.5–15.5)
WBC: 6.7 K/uL (ref 4.0–10.5)
nRBC: 0 % (ref 0.0–0.2)

## 2024-10-26 LAB — COMPREHENSIVE METABOLIC PANEL WITH GFR
ALT: 22 U/L (ref 0–44)
AST: 31 U/L (ref 15–41)
Albumin: 2 g/dL — ABNORMAL LOW (ref 3.5–5.0)
Alkaline Phosphatase: 54 U/L (ref 38–126)
Anion gap: 10 (ref 5–15)
BUN: <5 mg/dL — ABNORMAL LOW (ref 8–23)
CO2: 23 mmol/L (ref 22–32)
Calcium: 9.3 mg/dL (ref 8.9–10.3)
Chloride: 105 mmol/L (ref 98–111)
Creatinine, Ser: 1.24 mg/dL — ABNORMAL HIGH (ref 0.44–1.00)
GFR, Estimated: 42 mL/min — ABNORMAL LOW (ref >60)
Glucose, Bld: 95 mg/dL (ref 70–99)
Potassium: 4.2 mmol/L (ref 3.5–5.1)
Sodium: 138 mmol/L (ref 135–145)
Total Bilirubin: 0.9 mg/dL (ref 0.0–1.2)
Total Protein: 5.3 g/dL — ABNORMAL LOW (ref 6.5–8.1)

## 2024-10-26 LAB — PROCALCITONIN: Procalcitonin: 0.19 ng/mL

## 2024-10-26 LAB — C-REACTIVE PROTEIN: CRP: 8.9 mg/dL — ABNORMAL HIGH (ref <1.0)

## 2024-10-26 LAB — MAGNESIUM: Magnesium: 1.9 mg/dL (ref 1.7–2.4)

## 2024-10-26 NOTE — Progress Notes (Signed)
 [facility] Progress Note     Subjective: Denies pain this AM. Drain placed by IR yesterday with 100cc dark thin sanguinous fluid, Cultures pending  Objective: Vital signs in last 24 hours: Temp:  [97.5 F (36.4 C)-97.9 F (36.6 C)] 97.5 F (36.4 C) (11/26 0839) Pulse Rate:  [61-88] 67 (11/26 0839) Resp:  [10-23] 15 (11/26 0839) BP: (119-154)/(42-50) 150/43 (11/26 0839) SpO2:  [91 %-98 %] 95 % (11/26 0839) Last BM Date : 10/25/24  Intake/Output from previous day: 11/25 0701 - 11/26 0700 In: 1770.6 [I.V.:1245.9; IV Piggyback:524.7] Out: 400 [Drains:400] Intake/Output this shift: Total I/O In: -  Out: 20 [Drains:20]  PE: Gen:  Alert, NAD, pleasant Card:  Regular rate and rhythm Pulm:  Normal effort ORA  Abd: Soft, non-tender, no significant distention, drain with dark thin sanguinous fluid Skin: warm and dry, no rashes  Psych: A&Ox3   Lab Results:  Recent Labs    10/25/24 0020 10/26/24 0245  WBC 9.3 6.7  HGB 9.4* 9.6*  HCT 29.4* 29.4*  PLT 267 307   BMET Recent Labs    10/25/24 0020 10/26/24 0245  NA 136 138  K 3.3* 4.2  CL 103 105  CO2 20* 23  GLUCOSE 103* 95  BUN 6* <5*  CREATININE 1.23* 1.24*  CALCIUM  8.9 9.3   PT/INR Recent Labs    10/24/24 1223 10/25/24 0020  LABPROT 15.6* 15.7*  INR 1.2 1.2   CMP     Component Value Date/Time   NA 138 10/26/2024 0245   NA 143 09/07/2023 1005   K 4.2 10/26/2024 0245   CL 105 10/26/2024 0245   CO2 23 10/26/2024 0245   GLUCOSE 95 10/26/2024 0245   BUN <5 (L) 10/26/2024 0245   BUN 15 09/07/2023 1005   CREATININE 1.24 (H) 10/26/2024 0245   CALCIUM  9.3 10/26/2024 0245   PROT 5.3 (L) 10/26/2024 0245   PROT 7.0 09/01/2023 0927   ALBUMIN 2.0 (L) 10/26/2024 0245   ALBUMIN 4.4 09/01/2023 0927   AST 31 10/26/2024 0245   ALT 22 10/26/2024 0245   ALKPHOS 54 10/26/2024 0245   BILITOT 0.9 10/26/2024 0245   BILITOT 0.8 09/01/2023 0927   GFRNONAA 42 (L) 10/26/2024 0245   Lipase     Component  Value Date/Time   LIPASE >2,800 (H) 10/13/2024 0517       Studies/Results: CT GUIDED PERITONEAL/RETROPERITONEAL FLUID DRAIN BY PERC CATH Result Date: 10/25/2024 INDICATION: 88 year old female with enlarging right retroperitoneal fluid collection. EXAM: CT PERC DRAIN PERITONEAL ABCESS COMPARISON:  CT abdomen pelvis from earlier the same day MEDICATIONS: The patient is currently admitted to the hospital and receiving intravenous antibiotics. The antibiotics were administered within an appropriate time frame prior to the initiation of the procedure. ANESTHESIA/SEDATION: Moderate (conscious) sedation was employed during this procedure. A total of Versed  1 mg and Fentanyl  50 mcg was administered intravenously. Moderate Sedation Time: 14 minutes. The patient's level of consciousness and vital signs were monitored continuously by radiology nursing throughout the procedure under my direct supervision. CONTRAST:  None COMPLICATIONS: None immediate. PROCEDURE: RADIATION DOSE REDUCTION: This exam was performed according to the departmental dose-optimization program which includes automated exposure control, adjustment of the mA and/or kV according to patient size and/or use of iterative reconstruction technique. Informed written consent was obtained from the patient after a discussion of the risks, benefits and alternatives to treatment. The patient was placed supine on the CT gantry and a pre procedural CT was performed re-demonstrating the known abscess/fluid collection within  the right retroperitoneum. The procedure was planned. A timeout was performed prior to the initiation of the procedure. The right lower abdominal mid axillary region was prepped and draped in the usual sterile fashion. The overlying soft tissues were anesthetized with 1% lidocaine  with epinephrine. Appropriate trajectory was planned with the use of a 22 gauge spinal needle. An 18 gauge trocar needle was advanced into the abscess/fluid  collection and a short Amplatz super stiff wire was coiled within the collection. Appropriate positioning was confirmed with a limited CT scan. The tract was serially dilated allowing placement of a 10 French all-purpose drainage catheter. Appropriate positioning was confirmed with a limited postprocedural CT scan. Approximately 100 ml of thin, brown fluid was aspirated. The tube was connected to a bulb suction and sutured in place. A dressing was placed. The patient tolerated the procedure well without immediate post procedural complication. IMPRESSION: Successful CT guided placement of a 10 French all purpose drain catheter into the right retroperitoneal fluid collection with aspiration of approximately 100 mL of thin brown fluid. Samples were sent to the laboratory as requested by the ordering clinical team. Ester Sides, MD [facility] Electronically Signed   By: Ester Sides M.D.   On: 10/25/2024 18:14   CT ABDOMEN PELVIS WO CONTRAST Result Date: 10/25/2024 EXAM: CT ABDOMEN AND PELVIS WITHOUT CONTRAST 10/25/2024 10:34:25 AM TECHNIQUE: CT of the abdomen and pelvis was performed without the administration of intravenous contrast. Multiplanar reformatted images are provided for review. Automated exposure control, iterative reconstruction, and/or weight-based adjustment of the mA/kV was utilized to reduce the radiation dose to as low as reasonably achievable. COMPARISON: MRI 10/22/2024, CT 10/13/2024. CLINICAL HISTORY: Abdominal pain, acute, nonlocalized; Intra-abdominal fluid collection nonspecific source. FINDINGS: LOWER CHEST: Moderate right pleural effusion unchanged from MRI. LIVER: Hypodensities in the right hepatic lobe again noted. GALLBLADDER AND BILE DUCTS: Gallbladder is grossly normal on noncontrast exam. No biliary ductal dilatation. SPLEEN: No acute abnormality. PANCREAS: No acute abnormality. ADRENAL GLANDS: No acute abnormality.  KIDNEYS, URETERS AND BLADDER: No stones in the kidneys or ureters. No hydronephrosis. No perinephric or periureteral stranding. Urinary bladder is unremarkable. GI AND BOWEL: Stomach demonstrates no acute abnormality. There is no bowel obstruction. PERITONEUM AND RETROPERITONEUM: Fluid collection along the right paracolic gutter extending from the inferior margin of the right hepatic lobe into the iliac fossa. Low density fluid. The dimensions are similar to comparison MRI. There is some organization noted on comparison MRI. The greatest volume in axial dimension is within the iliac fossa on the right measuring 6.7 x 8.6 cm. Small amount of fluid in the pelvis. No free air. VASCULATURE: Aorta is normal in caliber. LYMPH NODES: No lymphadenopathy. REPRODUCTIVE ORGANS: Uterus is no longer normal. BONES AND SOFT TISSUES: No acute osseous abnormality. No focal soft tissue abnormality. IMPRESSION: 1. Persistent low density fluid collection in the right pericardial gutter similar to comparison MRI with the greatest dimension of volume within the right iliac fossa. On comparison MRI the collection appeared mildly complex and organized. No iv contrast on current exam. 2. Moderate volume right pleural effusion similar to prior. 3. Gallbladder grossly normal on noncontrast exam. Electronically signed by: Norleen Boxer MD 10/25/2024 11:17 AM EST RP Workstation: [id number]   NM Hepatobiliary Liver Func Result Date: 10/24/2024 EXAM: NM HEPATOBILLARY SCAN 10/24/2024 05:35:58 PM TECHNIQUE: RADIOPHARMACEUTICAL: 5.5 mCi Tc-100m mebrofenin  Anterior planar images of the abdomen and pelvis were obtained over approximately 1 hour. 2.3 mg Morphine  given intravenously  Dynamic imaging of  the abdomen is obtained COMPARISON: MRI 10/22/2024, ultrasound 10/13/2024 CLINICAL HISTORY: Biliary colic, recurrent, gallbladder dyskinesia suspected FINDINGS: Homogenous uptake within the liver. Normal clearance of the blood pool. Appropriate  excretion into the biliary system. Activity is visualized in the small bowel. Activity is visualized in the gallbladder following morphine  administration IMPRESSION: 1. Negative for acute cholecystitis ( gallbladder visualized after morphine  administration ) 2. Normal biliary to bowel transit. Electronically signed by: Luke Bun MD 10/24/2024 06:54 PM EST RP Workstation: [id number]    Anti-infectives: Anti-infectives (From admission, onward)    Start     Dose/Rate Route Frequency Ordered Stop   10/15/24 2200  piperacillin -tazobactam (ZOSYN ) IVPB 3.375 g        3.375 g 12.5 mL/hr over 240 Minutes Intravenous Every 8 hours 10/15/24 1542     10/14/24 1400  piperacillin -tazobactam (ZOSYN ) IVPB 2.25 g  Status:  Discontinued        2.25 g 100 mL/hr over 30 Minutes Intravenous Every 8 hours 10/14/24 1024 10/15/24 1542   10/13/24 1400  piperacillin -tazobactam (ZOSYN ) IVPB 3.375 g  Status:  Discontinued        3.375 g 100 mL/hr over 30 Minutes Intravenous Every 8 hours 10/13/24 1036 10/13/24 1038   10/13/24 1400  piperacillin -tazobactam (ZOSYN ) IVPB 3.375 g  Status:  Discontinued        3.375 g 12.5 mL/hr over 240 Minutes Intravenous Every 8 hours 10/13/24 1039 10/14/24 1024   10/13/24 1045  piperacillin -tazobactam (ZOSYN ) IVPB 3.375 g  Status:  Discontinued        3.375 g 12.5 mL/hr over 240 Minutes Intravenous Every 8 hours 10/13/24 1038 10/13/24 1039   10/13/24 0530  piperacillin -tazobactam (ZOSYN ) IVPB 3.375 g        3.375 g 100 mL/hr over 30 Minutes Intravenous  Once 10/13/24 0525 10/13/24 9362        Assessment/Plan Epigastric abdominal pain Acute pancreatitis  Possible liver masses/abscesses noted on CT  - CT 11/13 with subtle lesion in right liver with additional small lesions toward dome of liver, distended gallbladder with pericholecystic fluid and questionable gallbladder wall thickening, no gallstones, inflammation/edema along the distal ascending colon and hepatic flexure  without colonic wall thickening , small volume perihepatic free fluid with questionable periportal edema  - RUQ US  11/13 with gallbladder wall borderline thickening, no gallstones, no sonographic murphy sign, pericholecystic fluid present, CBD dilated to 8 mm, liver heterogeneous and echogenic, liver lesions not readily evident  - 11/15 MRCP with multicystic liver lesions, RUQ edema, no cholelithiasis, no biliary dilation, no overt pancreatic edema - 11/17 CT-guided liver biopsy by IR (nonspecific inflammation and reactive changes, no malignancy) - 11/22 MR liver with interval/progressing fluid collection in the right hemiabdomen, gallbladder wall edema, No gallstones, multiple cystic areas in segment 7/dome of liver, possible dilated biliary radicles, right pleural effusion.  - 11/24 HIDA negative for bile leak/cholecystitis  - increasing, right-sided intra-abdominal fluid of unclear etiology. Differential includes ascites from pancreatitis, bile leak that was not apparent on HIDA, hematoma from liver biopsy above, sequela of right-sided colitis (felt less likely). No role for acute surgical intervention.  - Continue IR drain, fluid sent this AM from drain for amylase and bilirubin  - per [facility] -  HTN HLD CKD stage IIIb   LOS: 12 days   I reviewed nursing notes, Consultant IR notes, hospitalist notes, last 24 h vitals and pain scores, last 48 h intake and output, last 24 h labs and trends, and last 24 h imaging results.  This care required moderate level of medical decision making.   Orie Silversmith, MD General Surgery, Surgical Critical Care and Trauma

## 2024-10-26 NOTE — TOC Progression Note (Signed)
 Transition of Care [facility]) - Progression Note    Patient Details  Name: Haley Randall MRN: [id number] Date of Birth: [birth year]  Transition of Care [facility]) CM/SW Contact  Landry DELENA Senters, RN Phone Number: 10/26/2024, 3:54 PM  Clinical Narrative:    CM spoke with patient's daughter, with patient permission, to verify information about home. Patient lives with 86yo grandson, who helps her at home. Does have daughter for support and transportation needs. Family will transport home, but the person will depend on what day d/c is. Please call daughter when dc plan is in place.   Patient refuses to take all home medications, except her BP medication. Patient does have PCP in place.   Continued medical workup.  CM will continue to follow.    Expected Discharge Plan: Home w Home Health Services Barriers to Discharge: Continued Medical Work up               Expected Discharge Plan and Services   Discharge Planning Services: CM Consult Post Acute Care Choice: Home Health Living arrangements for the past 2 months: Single Family Home                 DME Arranged: N/A         HH Arranged: PT HH Agency: [facility] Date [facility] Agency Contacted: 10/18/24 Time HH Agency Contacted: 1119 Representative spoke with at [facility] Agency: Darleene   Social Drivers of Health (SDOH) Interventions SDOH Screenings   Food Insecurity: No Food Insecurity (10/13/2024)  Housing: Low Risk  (10/13/2024)  Transportation Needs: No Transportation Needs (10/13/2024)  Utilities: Not At Risk (10/13/2024)  Alcohol Screen: Low Risk  (08/24/2023)  Depression (PHQ2-9): Low Risk  (08/24/2023)  Financial Resource Strain: Low Risk  (08/24/2023)  Physical Activity: Inactive (08/24/2023)  Social Connections: Socially Isolated (10/13/2024)  Stress: No Stress Concern Present (08/24/2023)  Tobacco Use: Medium Risk (10/13/2024)  Health Literacy: Adequate Health Literacy (08/24/2023)    Readmission Risk Interventions      No data to display

## 2024-10-26 NOTE — Progress Notes (Incomplete)
 Daily Progress Note  DOA: 10/13/2024 Hospital Day: 14  Cc:   ASSESSMENT        Principal Problem:   Abdominal pain Active Problems:   Pancreatitis   Gallbladder dilatation   Acute metabolic encephalopathy   Leukocytosis   Elevated liver function tests   Liver lesion, right lobe   Intra-abdominal abscess (HCC)   Malnutrition of moderate degree    PLAN   ***    Subjective       Objective   GI Studies:    Recent Labs    10/24/24 1123 10/25/24 0020 10/26/24 0245  WBC 11.8* 9.3 6.7  HGB 11.8* 9.4* 9.6*  HCT 35.9* 29.4* 29.4*  MCV 81.2 83.3 82.4  PLT 297 267 307   No results for input(s): FOLATE, VITAMINB12, FERRITIN, TIBC, IRONPCTSAT in the last 72 hours. Recent Labs    10/24/24 1123 10/25/24 0020 10/26/24 0245  NA 136 136 138  K 3.3* 3.3* 4.2  CL 100 103 105  CO2 19* 20* 23  GLUCOSE 88 103* 95  BUN 7* 6* <5*  CREATININE 1.24* 1.23* 1.24*  CALCIUM  9.3 8.9 9.3   Recent Labs    10/24/24 1123 10/25/24 0020 10/26/24 0245  PROT 6.9 5.3* 5.3*  ALBUMIN 2.5* 2.0* 2.0*  AST 37 32 31  ALT 27 22 22   ALKPHOS 71 54 54  BILITOT 1.2 1.1 0.9      Imaging:  CT GUIDED PERITONEAL/RETROPERITONEAL FLUID DRAIN BY PERC CATH INDICATION: 88 year old female with enlarging right retroperitoneal fluid collection.  EXAM: CT PERC DRAIN PERITONEAL ABCESS  COMPARISON:  CT abdomen pelvis from earlier the same day  MEDICATIONS: The patient is currently admitted to the hospital and receiving intravenous antibiotics. The antibiotics were administered within an appropriate time frame prior to the initiation of the procedure.  ANESTHESIA/SEDATION: Moderate (conscious) sedation was employed during this procedure. A total of Versed  1 mg and Fentanyl  50 mcg was administered intravenously.  Moderate Sedation Time: 14 minutes. The patient's level of consciousness and vital signs were monitored continuously by radiology nursing throughout  the procedure under my direct supervision.  CONTRAST:  None  COMPLICATIONS: None immediate.  PROCEDURE: RADIATION DOSE REDUCTION: This exam was performed according to the departmental dose-optimization program which includes automated exposure control, adjustment of the mA and/or kV according to patient size and/or use of iterative reconstruction technique.  Informed written consent was obtained from the patient after a discussion of the risks, benefits and alternatives to treatment. The patient was placed supine on the CT gantry and a pre procedural CT was performed re-demonstrating the known abscess/fluid collection within the right retroperitoneum. The procedure was planned. A timeout was performed prior to the initiation of the procedure.  The right lower abdominal mid axillary region was prepped and draped in the usual sterile fashion. The overlying soft tissues were anesthetized with 1% lidocaine  with epinephrine. Appropriate trajectory was planned with the use of a 22 gauge spinal needle. An 18 gauge trocar needle was advanced into the abscess/fluid collection and a short Amplatz super stiff wire was coiled within the collection. Appropriate positioning was confirmed with a limited CT scan. The tract was serially dilated allowing placement of a 10 French all-purpose drainage catheter. Appropriate positioning was confirmed with a limited postprocedural CT scan.  Approximately 100 ml of thin, brown fluid was aspirated. The tube was connected to a bulb suction and sutured in place. A dressing was placed. The patient tolerated the procedure well without immediate post  procedural complication.  IMPRESSION: Successful CT guided placement of a 10 French all purpose drain catheter into the right retroperitoneal fluid collection with aspiration of approximately 100 mL of thin brown fluid. Samples were sent to the laboratory as requested by the ordering clinical team.  Ester Sides, MD  [facility]  [facility]  Electronically Signed   By: Ester Sides M.D.   On: 10/25/2024 18:14 CT ABDOMEN PELVIS WO CONTRAST EXAM: CT ABDOMEN AND PELVIS WITHOUT CONTRAST 10/25/2024 10:34:25 AM  TECHNIQUE: CT of the abdomen and pelvis was performed without the administration of intravenous contrast. Multiplanar reformatted images are provided for review. Automated exposure control, iterative reconstruction, and/or weight-based adjustment of the mA/kV was utilized to reduce the radiation dose to as low as reasonably achievable.  COMPARISON: MRI 10/22/2024, CT 10/13/2024.  CLINICAL HISTORY: Abdominal pain, acute, nonlocalized; Intra-abdominal fluid collection nonspecific source.  FINDINGS:  LOWER CHEST: Moderate right pleural effusion unchanged from MRI.  LIVER: Hypodensities in the right hepatic lobe again noted.  GALLBLADDER AND BILE DUCTS: Gallbladder is grossly normal on noncontrast exam. No biliary ductal dilatation.  SPLEEN: No acute abnormality.  PANCREAS: No acute abnormality.  ADRENAL GLANDS: No acute abnormality.  KIDNEYS, URETERS AND BLADDER: No stones in the kidneys or ureters. No hydronephrosis. No perinephric or periureteral stranding. Urinary bladder is unremarkable.  GI AND BOWEL: Stomach demonstrates no acute abnormality. There is no bowel obstruction.  PERITONEUM AND RETROPERITONEUM: Fluid collection along the right paracolic gutter extending from the inferior margin of the right hepatic lobe into the iliac fossa. Low density fluid. The dimensions are similar to comparison MRI. There is some organization noted on comparison MRI. The greatest volume in axial dimension is within the iliac fossa on the right measuring 6.7 x 8.6 cm. Small amount of fluid in the pelvis. No free air.  VASCULATURE: Aorta is normal in caliber.  LYMPH NODES: No lymphadenopathy.  REPRODUCTIVE  ORGANS: Uterus is no longer normal.  BONES AND SOFT TISSUES: No acute osseous abnormality. No focal soft tissue abnormality.  IMPRESSION: 1. Persistent low density fluid collection in the right pericardial gutter similar to comparison MRI with the greatest dimension of volume within the right iliac fossa. On comparison MRI the collection appeared mildly complex and organized. No iv contrast on current exam. 2. Moderate volume right pleural effusion similar to prior. 3. Gallbladder grossly normal on noncontrast exam.  Electronically signed by: Norleen Boxer MD 10/25/2024 11:17 AM EST RP Workstation: [id number]     Scheduled inpatient medications:   amLODipine   10 mg Oral Daily   enoxaparin  (LOVENOX ) injection  30 mg Subcutaneous Daily   feeding supplement  237 mL Oral BID BM   ferrous sulfate   325 mg Oral Q breakfast   folic acid   1 mg Oral Daily   metoprolol  tartrate  50 mg Oral BID   multivitamin with minerals  1 tablet Oral Daily   pantoprazole   40 mg Oral BID   Continuous inpatient infusions:   piperacillin -tazobactam (ZOSYN )  IV 3.375 g (10/26/24 1407)   PRN inpatient medications: acetaminophen , albuterol , diltiazem , fentaNYL  (SUBLIMAZE ) injection, hydrALAZINE , LORazepam , ondansetron  **OR** ondansetron  (ZOFRAN ) IV, sodium chloride  flush  Vital signs in last 24 hours: Temp:  [97.5 F (36.4 C)-97.9 F (36.6 C)] 97.5 F (36.4 C) (11/26 0839) Pulse Rate:  [67-72] 67 (11/26 0839) Resp:  [10-15] 15 (11/26 0839) BP: (119-150)/(42-44) 150/43 (11/26 0839) SpO2:  [94 %-95 %] 95 % (11/26 0839) Last BM Date : 10/25/24  Intake/Output Summary (  Last 24 hours) at 10/26/2024 1705 Last data filed at 10/26/2024 1403 Gross per 24 hour  Intake 227 ml  Output 420 ml  Net -193 ml    Intake/Output from previous day: 11/25 0701 - 11/26 0700 In: 1770.6 [I.V.:1245.9; IV Piggyback:524.7] Out: 400 [Drains:400] Intake/Output this shift: Total I/O In: 227 [IV Piggyback:227] Out: 20  [Drains:20]   Physical Exam:  General: Alert ***female in NAD Heart:  Regular rate and rhythm.  Pulmonary: Normal respiratory effort Abdomen: Soft, nondistended, nontender. Normal bowel sounds. Extremities: No lower extremity edema  Neurologic: Alert and oriented Psych: Pleasant. Cooperative     LOS: 12 days   Vina Dasen ,NP 10/26/2024, 5:05 PM

## 2024-10-26 NOTE — Progress Notes (Addendum)
 Daily Progress Note  DOA: 10/13/2024 Hospital Day: 14  Cc: Abdominal pain, abnormal imaging of the liver and gallbladder  ASSESSMENT    Upper abdominal pain ? Liver masses Right intra-abdominal fluid collection, ? etiology -CT 11/13 with subtle lesion in right liver with additional small lesions toward dome of liver, distended gallbladder with pericholecystic fluid and questionable gallbladder wall thickening, no gallstones, inflammation/edema along the distal ascending colon and hepatic flexure without colonic wall thickening , small volume perihepatic free fluid with questionable periportal edema  - 11/15 MRCP with multicystic liver lesions, RUQ edema, no cholelithiasis, no biliary dilation, no overt pancreatic edema - 11/17 CT-guided liver biopsy by IR (nonspecific inflammation and reactive changes, no malignancy) -11/22  MRI liver with progressive complex fluid accumulation within the right hemiabdomen with early loculation. The complex fluid shows increased T1 signal, which may be seen with underlying concentrated bile duct, blood products or proteinaceous material. New gallbladder wall edema with pericholecystic fluid. Persistent area of hyperemia surrounding multiple cystic areas in the periphery of segment 7 and dome of liver which may re represent multiple dilated biliary radicles in the setting of cholangitis. No discrete drainable abscess or mass noted. Interval increase in volume of right pleural effusion -11/24 HIDA neg for acute cholecystitis -11/25 Surgery evaluated. No role for surgery. IR placed drain. Fluid studies pending.  -TODAY: Fluid gram stain - no organisms, no WBCs. LFTs remain normal. Drain output: 30-40 cc dark brown liquid . No abdominal pain . Appetite is diminished  Gram positive bacteremia - coag negative staph suspected contaminant  AKI on CKD3a    Hypertension  [place] Anemia Hemoglobin 11 on admission, declined to low 7s, status post 1 unit RBCs.   Hemoglobin has since remained stable.  Iron studies not consistent with iron deficiency   Principal Problem:   Abdominal pain Active Problems:   Pancreatitis   Gallbladder dilatation   Acute metabolic encephalopathy   Leukocytosis   Elevated liver function tests   Liver lesion, right lobe   Intra-abdominal abscess (HCC)   Malnutrition of moderate degree    PLAN   --Awaiting fluid culture, amylase and bilirubin --On IV zosyn    Subjective   No very hungry. Breeze to sweet, Ensure causes loose stool   Objective    Recent Labs    10/24/24 1123 10/25/24 0020 10/26/24 0245  WBC 11.8* 9.3 6.7  HGB 11.8* 9.4* 9.6*  HCT 35.9* 29.4* 29.4*  MCV 81.2 83.3 82.4  PLT 297 267 307   No results for input(s): FOLATE, VITAMINB12, FERRITIN, TIBC, IRONPCTSAT in the last 72 hours. Recent Labs    10/24/24 1123 10/25/24 0020 10/26/24 0245  NA 136 136 138  K 3.3* 3.3* 4.2  CL 100 103 105  CO2 19* 20* 23  GLUCOSE 88 103* 95  BUN 7* 6* <5*  CREATININE 1.24* 1.23* 1.24*  CALCIUM  9.3 8.9 9.3   Recent Labs    10/24/24 1123 10/25/24 0020 10/26/24 0245  PROT 6.9 5.3* 5.3*  ALBUMIN 2.5* 2.0* 2.0*  AST 37 32 31  ALT 27 22 22   ALKPHOS 71 54 54  BILITOT 1.2 1.1 0.9      Imaging:  CT GUIDED PERITONEAL/RETROPERITONEAL FLUID DRAIN BY PERC CATH INDICATION: 88 year old female with enlarging right retroperitoneal fluid collection.  EXAM: CT PERC DRAIN PERITONEAL ABCESS  COMPARISON:  CT abdomen pelvis from earlier the same day  MEDICATIONS: The patient is currently admitted to the hospital and receiving intravenous antibiotics. The antibiotics were administered  within an appropriate time frame prior to the initiation of the procedure.  ANESTHESIA/SEDATION: Moderate (conscious) sedation was employed during this procedure. A total of Versed  1 mg and Fentanyl  50 mcg was administered intravenously.  Moderate Sedation Time: 14 minutes. The patient's level  of consciousness and vital signs were monitored continuously by radiology nursing throughout the procedure under my direct supervision.  CONTRAST:  None  COMPLICATIONS: None immediate.  PROCEDURE: RADIATION DOSE REDUCTION: This exam was performed according to the departmental dose-optimization program which includes automated exposure control, adjustment of the mA and/or kV according to patient size and/or use of iterative reconstruction technique.  Informed written consent was obtained from the patient after a discussion of the risks, benefits and alternatives to treatment. The patient was placed supine on the CT gantry and a pre procedural CT was performed re-demonstrating the known abscess/fluid collection within the right retroperitoneum. The procedure was planned. A timeout was performed prior to the initiation of the procedure.  The right lower abdominal mid axillary region was prepped and draped in the usual sterile fashion. The overlying soft tissues were anesthetized with 1% lidocaine  with epinephrine. Appropriate trajectory was planned with the use of a 22 gauge spinal needle. An 18 gauge trocar needle was advanced into the abscess/fluid collection and a short Amplatz super stiff wire was coiled within the collection. Appropriate positioning was confirmed with a limited CT scan. The tract was serially dilated allowing placement of a 10 French all-purpose drainage catheter. Appropriate positioning was confirmed with a limited postprocedural CT scan.  Approximately 100 ml of thin, brown fluid was aspirated. The tube was connected to a bulb suction and sutured in place. A dressing was placed. The patient tolerated the procedure well without immediate post procedural complication.  IMPRESSION: Successful CT guided placement of a 10 French all purpose drain catheter into the right retroperitoneal fluid collection with aspiration of approximately 100 mL of thin brown  fluid. Samples were sent to the laboratory as requested by the ordering clinical team.  Ester Sides, MD  [facility]  [facility]  Electronically Signed   By: Ester Sides M.D.   On: 10/25/2024 18:14 CT ABDOMEN PELVIS WO CONTRAST EXAM: CT ABDOMEN AND PELVIS WITHOUT CONTRAST 10/25/2024 10:34:25 AM  TECHNIQUE: CT of the abdomen and pelvis was performed without the administration of intravenous contrast. Multiplanar reformatted images are provided for review. Automated exposure control, iterative reconstruction, and/or weight-based adjustment of the mA/kV was utilized to reduce the radiation dose to as low as reasonably achievable.  COMPARISON: MRI 10/22/2024, CT 10/13/2024.  CLINICAL HISTORY: Abdominal pain, acute, nonlocalized; Intra-abdominal fluid collection nonspecific source.  FINDINGS:  LOWER CHEST: Moderate right pleural effusion unchanged from MRI.  LIVER: Hypodensities in the right hepatic lobe again noted.  GALLBLADDER AND BILE DUCTS: Gallbladder is grossly normal on noncontrast exam. No biliary ductal dilatation.  SPLEEN: No acute abnormality.  PANCREAS: No acute abnormality.  ADRENAL GLANDS: No acute abnormality.  KIDNEYS, URETERS AND BLADDER: No stones in the kidneys or ureters. No hydronephrosis. No perinephric or periureteral stranding. Urinary bladder is unremarkable.  GI AND BOWEL: Stomach demonstrates no acute abnormality. There is no bowel obstruction.  PERITONEUM AND RETROPERITONEUM: Fluid collection along the right paracolic gutter extending from the inferior margin of the right hepatic lobe into the iliac fossa. Low density fluid. The dimensions are similar to comparison MRI. There is some organization noted on comparison MRI. The greatest volume in axial dimension is within the iliac fossa on the  right measuring 6.7 x 8.6 cm. Small amount of fluid in the pelvis. No free  air.  VASCULATURE: Aorta is normal in caliber.  LYMPH NODES: No lymphadenopathy.  REPRODUCTIVE ORGANS: Uterus is no longer normal.  BONES AND SOFT TISSUES: No acute osseous abnormality. No focal soft tissue abnormality.  IMPRESSION: 1. Persistent low density fluid collection in the right pericardial gutter similar to comparison MRI with the greatest dimension of volume within the right iliac fossa. On comparison MRI the collection appeared mildly complex and organized. No iv contrast on current exam. 2. Moderate volume right pleural effusion similar to prior. 3. Gallbladder grossly normal on noncontrast exam.  Electronically signed by: Norleen Boxer MD 10/25/2024 11:17 AM EST RP Workstation: [id number]     Scheduled inpatient medications:   amLODipine   10 mg Oral Daily   enoxaparin  (LOVENOX ) injection  30 mg Subcutaneous Daily   feeding supplement  237 mL Oral BID BM   ferrous sulfate   325 mg Oral Q breakfast   folic acid   1 mg Oral Daily   metoprolol  tartrate  50 mg Oral BID   multivitamin with minerals  1 tablet Oral Daily   pantoprazole   40 mg Oral BID   Continuous inpatient infusions:   piperacillin -tazobactam (ZOSYN )  IV 3.375 g (10/26/24 1407)   PRN inpatient medications: acetaminophen , albuterol , diltiazem , fentaNYL  (SUBLIMAZE ) injection, hydrALAZINE , LORazepam , ondansetron  **OR** ondansetron  (ZOFRAN ) IV, sodium chloride  flush  Vital signs in last 24 hours: Temp:  [97.5 F (36.4 C)-97.9 F (36.6 C)] 97.5 F (36.4 C) (11/26 0839) Pulse Rate:  [67-72] 67 (11/26 0839) Resp:  [10-15] 15 (11/26 0839) BP: (119-150)/(42-44) 150/43 (11/26 0839) SpO2:  [94 %-95 %] 95 % (11/26 0839) Last BM Date : 10/25/24  Intake/Output Summary (Last 24 hours) at 10/26/2024 1654 Last data filed at 10/26/2024 1403 Gross per 24 hour  Intake 227 ml  Output 420 ml  Net -193 ml    Intake/Output from previous day: 11/25 0701 - 11/26 0700 In: 1770.6 [I.V.:1245.9; IV  Piggyback:524.7] Out: 400 [Drains:400] Intake/Output this shift: Total I/O In: 227 [IV Piggyback:227] Out: 20 [Drains:20]   Physical Exam:  General: Alert female in NAD Heart:  Regular rate and rhythm.  Pulmonary: Normal respiratory effort Abdomen: Soft, nondistended, nontender. Normal bowel sounds.Approx 40 cc dark brown liquid in drain bulb Extremities: No lower extremity edema  Neurologic: Alert and oriented Psych: Pleasant. Cooperative     LOS: 12 days   Vina Dasen ,NP 10/26/2024, 4:54 PM  GI ATTENDING  As above. The etiology of the right retroperitoneal fluid collection unclear. No obvious problem for GI medicine. Management per IR and GSU. We are available as needed.  Norleen SAILOR. Abran Raddle., M.D. [facility] Division of Gastroenterology

## 2024-10-26 NOTE — Progress Notes (Signed)
 Referring Physician(s):  Almarie Pringle, PA-C   Supervising Physician: Philip Cornet  Patient Status:  [facility] - In-pt  Chief Complaint:  S/p abd fluid collection drain placed 10/25/24 by Dr. Jennefer  Subjective:  Patient resting in bed, pleasant, interested in her care. Denies abd pain, fever, chills, N/V, SOB, CP, or any other concern.   Allergies: Lisinopril   Medications: Prior to Admission medications   Not on File     Vital Signs: BP (!) 150/43 (BP Location: Left Arm)   Pulse 67   Temp (!) 97.5 F (36.4 C) (Oral)   Resp 15   Ht 4' 11 (1.499 m)   Wt 128 lb 4.9 oz (58.2 kg)   SpO2 95%   BMI 25.91 kg/m   Physical Exam Constitutional:      General: She is not in acute distress. Pulmonary:     Effort: Pulmonary effort is normal.  Abdominal:     General: There is no distension.     Palpations: Abdomen is soft.     Tenderness: There is no abdominal tenderness.     Comments: Flushes/aspirates easily.  Insertion site unremarkable. Suture and stat lock in place. Dressed appropriately.  Bulb appropriately charged with 30 cc brown, thin output in collection bulb.  Skin:    General: Skin is warm and dry.  Neurological:     Mental Status: She is alert.  Psychiatric:        Behavior: Behavior normal.     Imaging: CT GUIDED PERITONEAL/RETROPERITONEAL FLUID DRAIN BY PERC CATH Result Date: 10/25/2024 INDICATION: 88 year old female with enlarging right retroperitoneal fluid collection. EXAM: CT PERC DRAIN PERITONEAL ABCESS COMPARISON:  CT abdomen pelvis from earlier the same day MEDICATIONS: The patient is currently admitted to the hospital and receiving intravenous antibiotics. The antibiotics were administered within an appropriate time frame prior to the initiation of the procedure. ANESTHESIA/SEDATION: Moderate (conscious) sedation was employed during this procedure. A total of Versed  1 mg and Fentanyl  50 mcg was administered intravenously. Moderate Sedation Time:  14 minutes. The patient's level of consciousness and vital signs were monitored continuously by radiology nursing throughout the procedure under my direct supervision. CONTRAST:  None COMPLICATIONS: None immediate. PROCEDURE: RADIATION DOSE REDUCTION: This exam was performed according to the departmental dose-optimization program which includes automated exposure control, adjustment of the mA and/or kV according to patient size and/or use of iterative reconstruction technique. Informed written consent was obtained from the patient after a discussion of the risks, benefits and alternatives to treatment. The patient was placed supine on the CT gantry and a pre procedural CT was performed re-demonstrating the known abscess/fluid collection within the right retroperitoneum. The procedure was planned. A timeout was performed prior to the initiation of the procedure. The right lower abdominal mid axillary region was prepped and draped in the usual sterile fashion. The overlying soft tissues were anesthetized with 1% lidocaine  with epinephrine. Appropriate trajectory was planned with the use of a 22 gauge spinal needle. An 18 gauge trocar needle was advanced into the abscess/fluid collection and a short Amplatz super stiff wire was coiled within the collection. Appropriate positioning was confirmed with a limited CT scan. The tract was serially dilated allowing placement of a 10 French all-purpose drainage catheter. Appropriate positioning was confirmed with a limited postprocedural CT scan. Approximately 100 ml of thin, brown fluid was aspirated. The tube was connected to a bulb suction and sutured in place. A dressing was placed. The patient tolerated the procedure well without immediate post  procedural complication. IMPRESSION: Successful CT guided placement of a 10 French all purpose drain catheter into the right retroperitoneal fluid collection with aspiration of approximately 100 mL of thin brown fluid. Samples were  sent to the laboratory as requested by the ordering clinical team. Ester Sides, MD [facility] Radiology Electronically Signed   By: Ester Sides M.D.   On: 10/25/2024 18:14   CT ABDOMEN PELVIS WO CONTRAST Result Date: 10/25/2024 EXAM: CT ABDOMEN AND PELVIS WITHOUT CONTRAST 10/25/2024 10:34:25 AM TECHNIQUE: CT of the abdomen and pelvis was performed without the administration of intravenous contrast. Multiplanar reformatted images are provided for review. Automated exposure control, iterative reconstruction, and/or weight-based adjustment of the mA/kV was utilized to reduce the radiation dose to as low as reasonably achievable. COMPARISON: MRI 10/22/2024, CT 10/13/2024. CLINICAL HISTORY: Abdominal pain, acute, nonlocalized; Intra-abdominal fluid collection nonspecific source. FINDINGS: LOWER CHEST: Moderate right pleural effusion unchanged from MRI. LIVER: Hypodensities in the right hepatic lobe again noted. GALLBLADDER AND BILE DUCTS: Gallbladder is grossly normal on noncontrast exam. No biliary ductal dilatation. SPLEEN: No acute abnormality. PANCREAS: No acute abnormality. ADRENAL GLANDS: No acute abnormality. KIDNEYS, URETERS AND BLADDER: No stones in the kidneys or ureters. No hydronephrosis. No perinephric or periureteral stranding. Urinary bladder is unremarkable. GI AND BOWEL: Stomach demonstrates no acute abnormality. There is no bowel obstruction. PERITONEUM AND RETROPERITONEUM: Fluid collection along the right paracolic gutter extending from the inferior margin of the right hepatic lobe into the iliac fossa. Low density fluid. The dimensions are similar to comparison MRI. There is some organization noted on comparison MRI. The greatest volume in axial dimension is within the iliac fossa on the right measuring 6.7 x 8.6 cm. Small amount of fluid in the pelvis. No free air. VASCULATURE: Aorta is normal in caliber. LYMPH NODES: No lymphadenopathy.  REPRODUCTIVE ORGANS: Uterus is no longer normal. BONES AND SOFT TISSUES: No acute osseous abnormality. No focal soft tissue abnormality. IMPRESSION: 1. Persistent low density fluid collection in the right pericardial gutter similar to comparison MRI with the greatest dimension of volume within the right iliac fossa. On comparison MRI the collection appeared mildly complex and organized. No iv contrast on current exam. 2. Moderate volume right pleural effusion similar to prior. 3. Gallbladder grossly normal on noncontrast exam. Electronically signed by: Norleen Boxer MD 10/25/2024 11:17 AM EST RP Workstation: [id number]   NM Hepatobiliary Liver Func Result Date: 10/24/2024 EXAM: NM HEPATOBILLARY SCAN 10/24/2024 05:35:58 PM TECHNIQUE: RADIOPHARMACEUTICAL: 5.5 mCi Tc-59m mebrofenin  Anterior planar images of the abdomen and pelvis were obtained over approximately 1 hour. 2.3 mg Morphine  given intravenously  Dynamic imaging of the abdomen is obtained COMPARISON: MRI 10/22/2024, ultrasound 10/13/2024 CLINICAL HISTORY: Biliary colic, recurrent, gallbladder dyskinesia suspected FINDINGS: Homogenous uptake within the liver. Normal clearance of the blood pool. Appropriate excretion into the biliary system. Activity is visualized in the small bowel. Activity is visualized in the gallbladder following morphine  administration IMPRESSION: 1. Negative for acute cholecystitis ( gallbladder visualized after morphine  administration ) 2. Normal biliary to bowel transit. Electronically signed by: Luke Bun MD 10/24/2024 06:54 PM EST RP Workstation: [id number]    Labs:  CBC: Recent Labs    10/22/24 0320 10/24/24 1123 10/25/24 0020 10/26/24 0245  WBC 12.9* 11.8* 9.3 6.7  HGB 9.0* 11.8* 9.4* 9.6*  HCT 27.8* 35.9* 29.4* 29.4*  PLT 234 297 267 307    COAGS: Recent Labs    10/13/24 0517 10/17/24 0404 10/24/24 1223 10/25/24 0020  INR 1.1 1.1 1.2  1.2    BMP: Recent Labs    10/22/24 0320 10/24/24 1123  10/25/24 0020 10/26/24 0245  NA 140 136 136 138  K 3.6 3.3* 3.3* 4.2  CL 106 100 103 105  CO2 17* 19* 20* 23  GLUCOSE 82 88 103* 95  BUN 7* 7* 6* <5*  CALCIUM  9.5 9.3 8.9 9.3  CREATININE 1.53* 1.24* 1.23* 1.24*  GFRNONAA 33* 42* 43* 42*    LIVER FUNCTION TESTS: Recent Labs    10/22/24 0320 10/24/24 1123 10/25/24 0020 10/26/24 0245  BILITOT 1.5* 1.2 1.1 0.9  AST 24 37 32 31  ALT 24 27 22 22   ALKPHOS 57 71 54 54  PROT 5.4* 6.9 5.3* 5.3*  ALBUMIN 2.0* 2.5* 2.0* 2.0*    Assessment and Plan:  Drain Location: RLQ Size: Fr size: 10 Fr Date of placement: 10/25/24  Currently to: Drain collection device: suction bulb 24 hour output:  Output by Drain (mL) 10/24/24 0701 - 10/24/24 1900 10/24/24 1901 - 10/25/24 0700 10/25/24 0701 - 10/25/24 1900 10/25/24 1901 - 10/26/24 0700 10/26/24 0701 - 10/26/24 1822  Closed System Drain Lateral;Right Abdomen Bulb (JP) 10 Fr.   210 190 55    Interval imaging/drain manipulation:  none  Current examination: Flushes/aspirates easily.  Insertion site unremarkable. Suture and stat lock in place. Dressed appropriately.  Bulb appropriately charged with 30 cc brown, thin output in collection bulb. VSS, afebrile, WBC WNL Cultures from fluid pending, on zosyn   Plan: Continue TID flushes with 5 cc NS. Record output Q shift. Dressing changes QD or PRN if soiled.  Call IR APP or on call IR MD if difficulty flushing or sudden change in drain output.  Repeat imaging/possible drain injection once output < 10 mL/QD (excluding flush material). Consideration for drain removal if output is < 10 mL/QD (excluding flush material), pending discussion with the providing surgical service.  Discharge planning: Please contact IR APP or on call IR MD prior to patient d/c to ensure appropriate follow up plans are in place. Typically patient will follow up with IR clinic 10-14 days post d/c for repeat imaging/possible drain injection. IR scheduler will contact  patient with date/time of appointment. Patient will need to flush drain QD with 5 cc NS, record output QD, dressing changes every 2-3 days or earlier if soiled.   IR will continue to follow - please call with questions or concerns.     Electronically Signed: Laymon Coast, NP 10/26/2024, 6:21 PM   I spent a total of 15 Minutes at the the patient's bedside AND on the patient's hospital floor or unit, greater than 50% of which was counseling/coordinating care for s/p abd fluid collection drain placement 11/25.

## 2024-10-26 NOTE — Progress Notes (Addendum)
   10/26/24 0944  Mobility  Activity Ambulated with assistance  Level of Assistance Standby assist, set-up cues, supervision of patient - no hands on  Assistive Device Four wheel walker  Distance Ambulated (ft) 400 ft  Activity Response Tolerated fair  Mobility Referral Yes  Mobility visit 1 Mobility  Mobility Specialist Start Time (ACUTE ONLY) 0944  Mobility Specialist Stop Time (ACUTE ONLY) 1000  Mobility Specialist Time Calculation (min) (ACUTE ONLY) 16 min   Mobility Specialist: Progress Note   Post-Mobility:    HR 77, SpO2 95% RA   Pt agreeable to mobility session-  requesting to use BR - received in chair. Pt was asymptomatic throughout session with no complaints. BR used, pericare ind. Returned to chair with all needs met - call bell within reach. Chair alarm on.    Additional comments: Pt seen for additional session, results same as before. Left in chair, chair alarm on. Family present.     Virgle Boards, BS Mobility Specialist Please contact via SecureChat or  Rehab office at [phone].

## 2024-10-26 NOTE — Progress Notes (Signed)
 PROGRESS NOTE        PATIENT DETAILS Name: Haley Randall Age: 88 y.o. Sex: female Date of Birth: 1936-06-26 Admit Date: 10/13/2024 Admitting Physician [name], MD ERE:Ilhjo, Ginger, FNP  Brief Summary: Patient is a 88 y.o.  female with history of HTN, HLD-who presented with abdominal pain-found to have pancreatitis/vague liver lesions on CT abdomen.  Significant events: 11/13>> admit to [facility]  Significant studies: 11/13>> CXR: No PNA 11/13>> CT abdomen/pelvis: 4.8 x 3.1 cm subtle lesion-right liver-additional small lesions measuring up to 2.1 cm.  Gallbladder distended.  Edema/inflammation along the descending ascending colon/hepatic flexure. 11/13>> RUQ ultrasound: Gallbladder wall upper normal to borderline thickened, no echogenic gallstones.  CBD 8 mm. 11/14>> MRI abdomen: Multicystic enhancing lesions in the liver-abscesses versus metastatic disease 10/17/2024.  CT-guided liver biopsy by IR   10/22/24 - repeat MRCP -  1. Progressive complex fluid accumulation within the right hemiabdomen with early loculation. The complex fluid shows increased T1 signal, which may be seen with underlying concentrated bile duct, blood products or proteinaceous material. A nuclear medicine HIDA scan may be helpful to exclude bile leak. 2. New gallbladder wall edema with pericholecystic fluid. No gallstones identified. 3. Persistent area of hyperemia surrounding multiple cystic areas in the periphery of segment 7 and dome of liver which may re represent multiple dilated biliary radicles in the setting of cholangitis. No discrete drainable abscess or mass noted. 4. Interval increase in volume of right pleural effusion, now moderate in volume, with overlying atelectasis or consolidation. 11/24>> HIDA scan nonacute 11/25>> right upper quadrant drain placement by IR   Significant microbiology data:  11/13>> COVID/influenza/RSV PCR: Negative 11/13>> urine culture: Multiple  species 11/13>> 1/2: Gram-positive cocci (prelim Staph epidermidis)-likely contamination. 11/14>> acute hepatitis serology: Negative 11/14>> RPR: Nonreactive  Consults:  General surgery IR [facility] called x 2, Dr. San.  They have nothing to offer in terms of doing a consultation.  Subjective:   He reports poor appetite, no abdominal pain, no nausea, no vomiting   Objective: Vitals: Blood pressure (!) 150/43, pulse 67, temperature (!) 97.5 F (36.4 C), temperature source Oral, resp. rate 15, height 4' 11 (1.499 m), weight 58.2 kg, SpO2 95%.   Exam:  Awake Alert, Oriented X 3, frail, deconditioned Symmetrical Chest wall movement, Good air movement bilaterally, CTAB RRR,No Gallops,Rubs or new Murmurs, No Parasternal Heave +ve B.Sounds, Abd Soft, No tenderness, right quadrant drain present with sanguinous output No Cyanosis, Clubbing, +1 edema, No new Rash or bruise    Assessment/Plan:  acute pancreatitis  Gastric abdominal pain  asymptomatic transaminitis and mildly elevated bilirubin levels Multicystic enhancing liver lesion.   Possible colitis. With supportive care acute pancreatitis completely resolved, no gallstones on imaging, no history of alcohol use, MRCP unremarkable in terms of no choledocholithiasis-no major pancreatic abnormality on MRI.  HIDA scan unremarkable, on empiric antibiotic for questionable colitis and possible abscess.  Has been seen by general surgery and GI, also being evaluated by IR.  Underwent liver biopsy which was nonmalignant, specimen was insufficient for culture or sensitivity. Patient has been maintained on IV antibiotics, clinically stable, no right upper quadrant pain, MRCP and HIDA scan unrevealing, per general surgery and IR repeating CT abdomen pelvis on 10/25/2024 to check for any fluid collection, general surgery to be reconsulted again on 10/25/2024 per IR request.  Continue to monitor.  Clinically  no pancreatitis, no  active evidence of active colitis clinically. - General Surgery input greatly appreciated, status post drain placement by IR 11/26, follow on cultures, amylase and bilirubin   Gram-positive bacteremia-coag negative staph (preliminary result)  likely a contaminant, 1 out of 2 blood cultures, other one showed no growth for completion  HTN Systolic blood pressures remain elevated but diastolics remain low which has been a problem in the past No return of tachycardia after starting metoprolol  Hold off on resuming amlodipine  Trial TED stockings, issues with orthostatic hypotension in the past  NAGMA Prerenal AKI on CKD stage IIIa/b Bicarb continues to fluctuate between 16 and 19 Likely 2/2 renal dysfunction, which has been stable at baseline over the past few days after initial AKI Continue to monitor   Normocytic anemia No recent baseline but hemoglobin had been normal over 1 year ago Hemoglobin 11 on admission and dropped to 7.3 on 11/19, s/p 1 unit PRBCs on 11/19 Hemoglobin stable at 9.6 this morning Iron labs most consistent with anemia of chronic disease Continue ferrous sulfate   Hypomagnesemia, hypokalemia, hypophosphatemia Continue to monitor and replete as needed  ?  Mild dementia/cognitive issues B12/RPR all stable Ammonia level is only minimally elevated-doubt of any clinical significance. No confusion this morning-continue to maintain delirium precautions  Elevated TSH TSH 5.8 with T4 1.04.  Likely sick euthyroid and will need repeat TSH in 4 to 6 weeks  Palliative care Previously had long discussion with the patient/daughter regarding findings on MRI that could represent metastatic disease.  Underwent liver biopsy 11/17 and awaiting results but if metastatic disease they seem to be leaning towards stopping further workup but will continue to discuss  Code status:   Code Status: Full Code   DVT Prophylaxis: Place TED hose Start: 10/19/24 0932 enoxaparin  (LOVENOX )  injection 30 mg Start: 10/18/24 1000 Place and maintain sequential compression device Start: 10/16/24 0843   Family Communication: None at bedside  Disposition Plan: Status is: Observation The patient will require care spanning > 2 midnights and should be moved to inpatient because: Severity of illness   Planned Discharge Destination:Home   Diet: Diet Order             DIET SOFT Room service appropriate? Yes; Fluid consistency: Thin  Diet effective now                    MEDICATIONS: Scheduled Meds:  amLODipine   10 mg Oral Daily   enoxaparin  (LOVENOX ) injection  30 mg Subcutaneous Daily   feeding supplement  237 mL Oral BID BM   ferrous sulfate   325 mg Oral Q breakfast   folic acid   1 mg Oral Daily   metoprolol  tartrate  50 mg Oral BID   multivitamin with minerals  1 tablet Oral Daily   pantoprazole   40 mg Oral BID   Continuous Infusions:  piperacillin -tazobactam (ZOSYN )  IV 3.375 g (10/26/24 0518)   PRN Meds:.acetaminophen , albuterol , diltiazem , fentaNYL  (SUBLIMAZE ) injection, hydrALAZINE , LORazepam , ondansetron  **OR** ondansetron  (ZOFRAN ) IV, sodium chloride  flush   I have personally reviewed following labs and imaging studies  LABORATORY DATA:   Data Review:   Inpatient Medications  Scheduled Meds:  amLODipine   10 mg Oral Daily   enoxaparin  (LOVENOX ) injection  30 mg Subcutaneous Daily   feeding supplement  237 mL Oral BID BM   ferrous sulfate   325 mg Oral Q breakfast   folic acid   1 mg Oral Daily   metoprolol  tartrate  50 mg Oral BID  multivitamin with minerals  1 tablet Oral Daily   pantoprazole   40 mg Oral BID   Continuous Infusions:  piperacillin -tazobactam (ZOSYN )  IV 3.375 g (10/26/24 0518)   PRN Meds:.acetaminophen , albuterol , diltiazem , fentaNYL  (SUBLIMAZE ) injection, hydrALAZINE , LORazepam , ondansetron  **OR** ondansetron  (ZOFRAN ) IV, sodium chloride  flush  DVT Prophylaxis  Place TED hose Start: 10/19/24 0932 enoxaparin  (LOVENOX )  injection 30 mg Start: 10/18/24 1000 Place and maintain sequential compression device Start: 10/16/24 0843   Recent Labs  Lab 10/21/24 0255 10/22/24 0320 10/24/24 1123 10/25/24 0020 10/26/24 0245  WBC 14.4* 12.9* 11.8* 9.3 6.7  HGB 9.6* 9.0* 11.8* 9.4* 9.6*  HCT 29.9* 27.8* 35.9* 29.4* 29.4*  PLT 220 234 297 267 307  MCV 84.2 83.0 81.2 83.3 82.4  MCH 27.0 26.9 26.7 26.6 26.9  MCHC 32.1 32.4 32.9 32.0 32.7  RDW 17.0* 17.2* 17.2* 17.4* 17.3*  LYMPHSABS 0.9 0.7 0.6* 0.6* 0.7  MONOABS 1.1* 0.9 0.5 0.6 0.7  EOSABS 0.0 0.1 0.0 0.0 0.0  BASOSABS 0.1 0.0 0.0 0.0 0.0    Recent Labs  Lab 10/20/24 0900 10/21/24 0255 10/22/24 0320 10/24/24 1123 10/24/24 1223 10/25/24 0020 10/26/24 0245  NA 136 135 140 136  --  136 138  K 3.4* 4.4 3.6 3.3*  --  3.3* 4.2  CL 102 105 106 100  --  103 105  CO2 18* 16* 17* 19*  --  20* 23  ANIONGAP 16* 14 17* 17*  --  13 10  GLUCOSE 81 79 82 88  --  103* 95  BUN 9 10 7* 7*  --  6* <5*  CREATININE 1.34* 1.38* 1.53* 1.24*  --  1.23* 1.24*  AST 27 27 24  37  --  32 31  ALT 34 28 24 27   --  22 22  ALKPHOS 71 80 57 71  --  54 54  BILITOT 1.5* 1.6* 1.5* 1.2  --  1.1 0.9  ALBUMIN 2.3* 2.2* 2.0* 2.5*  --  2.0* 2.0*  CRP  --   --   --  15.7*  --  11.4* 8.9*  PROCALCITON  --   --  0.47 0.19  --  0.34 0.19  INR  --   --   --   --  1.2 1.2  --   MG 1.9 1.9  --  1.7  --  2.2 1.9  PHOS 2.5 2.1*  --   --   --   --   --   CALCIUM  9.3 9.4 9.5 9.3  --  8.9 9.3      Recent Labs  Lab 10/20/24 0900 10/21/24 0255 10/22/24 0320 10/24/24 1123 10/24/24 1223 10/25/24 0020 10/26/24 0245  CRP  --   --   --  15.7*  --  11.4* 8.9*  PROCALCITON  --   --  0.47 0.19  --  0.34 0.19  INR  --   --   --   --  1.2 1.2  --   MG 1.9 1.9  --  1.7  --  2.2 1.9  CALCIUM  9.3 9.4 9.5 9.3  --  8.9 9.3    --------------------------------------------------------------------------------------------------------------- Lab Results  Component Value Date   CHOL 157 09/01/2023    HDL 86 09/01/2023   LDLCALC 57 09/01/2023   TRIG 74 09/01/2023   CHOLHDL 1.8 09/01/2023    Lab Results  Component Value Date   HGBA1C 6.2 10/02/2022   Radiology Reports  CT GUIDED PERITONEAL/RETROPERITONEAL FLUID DRAIN BY PERC CATH Result Date: 10/25/2024 INDICATION: 88 year old  female with enlarging right retroperitoneal fluid collection. EXAM: CT PERC DRAIN PERITONEAL ABCESS COMPARISON:  CT abdomen pelvis from earlier the same day MEDICATIONS: The patient is currently admitted to the hospital and receiving intravenous antibiotics. The antibiotics were administered within an appropriate time frame prior to the initiation of the procedure. ANESTHESIA/SEDATION: Moderate (conscious) sedation was employed during this procedure. A total of Versed  1 mg and Fentanyl  50 mcg was administered intravenously. Moderate Sedation Time: 14 minutes. The patient's level of consciousness and vital signs were monitored continuously by radiology nursing throughout the procedure under my direct supervision. CONTRAST:  None COMPLICATIONS: None immediate. PROCEDURE: RADIATION DOSE REDUCTION: This exam was performed according to the departmental dose-optimization program which includes automated exposure control, adjustment of the mA and/or kV according to patient size and/or use of iterative reconstruction technique. Informed written consent was obtained from the patient after a discussion of the risks, benefits and alternatives to treatment. The patient was placed supine on the CT gantry and a pre procedural CT was performed re-demonstrating the known abscess/fluid collection within the right retroperitoneum. The procedure was planned. A timeout was performed prior to the initiation of the procedure. The right lower abdominal mid axillary region was prepped and draped in the usual sterile fashion. The overlying soft tissues were anesthetized with 1% lidocaine  with epinephrine. Appropriate trajectory was planned with the use  of a 22 gauge spinal needle. An 18 gauge trocar needle was advanced into the abscess/fluid collection and a short Amplatz super stiff wire was coiled within the collection. Appropriate positioning was confirmed with a limited CT scan. The tract was serially dilated allowing placement of a 10 French all-purpose drainage catheter. Appropriate positioning was confirmed with a limited postprocedural CT scan. Approximately 100 ml of thin, brown fluid was aspirated. The tube was connected to a bulb suction and sutured in place. A dressing was placed. The patient tolerated the procedure well without immediate post procedural complication. IMPRESSION: Successful CT guided placement of a 10 French all purpose drain catheter into the right retroperitoneal fluid collection with aspiration of approximately 100 mL of thin brown fluid. Samples were sent to the laboratory as requested by the ordering clinical team. Ester Sides, MD [facility] Radiology Electronically Signed   By: Ester Sides M.D.   On: 10/25/2024 18:14   CT ABDOMEN PELVIS WO CONTRAST Result Date: 10/25/2024 EXAM: CT ABDOMEN AND PELVIS WITHOUT CONTRAST 10/25/2024 10:34:25 AM TECHNIQUE: CT of the abdomen and pelvis was performed without the administration of intravenous contrast. Multiplanar reformatted images are provided for review. Automated exposure control, iterative reconstruction, and/or weight-based adjustment of the mA/kV was utilized to reduce the radiation dose to as low as reasonably achievable. COMPARISON: MRI 10/22/2024, CT 10/13/2024. CLINICAL HISTORY: Abdominal pain, acute, nonlocalized; Intra-abdominal fluid collection nonspecific source. FINDINGS: LOWER CHEST: Moderate right pleural effusion unchanged from MRI. LIVER: Hypodensities in the right hepatic lobe again noted. GALLBLADDER AND BILE DUCTS: Gallbladder is grossly normal on noncontrast exam. No biliary ductal dilatation. SPLEEN: No acute  abnormality. PANCREAS: No acute abnormality. ADRENAL GLANDS: No acute abnormality. KIDNEYS, URETERS AND BLADDER: No stones in the kidneys or ureters. No hydronephrosis. No perinephric or periureteral stranding. Urinary bladder is unremarkable. GI AND BOWEL: Stomach demonstrates no acute abnormality. There is no bowel obstruction. PERITONEUM AND RETROPERITONEUM: Fluid collection along the right paracolic gutter extending from the inferior margin of the right hepatic lobe into the iliac fossa. Low density fluid. The dimensions are similar to comparison MRI. There is  some organization noted on comparison MRI. The greatest volume in axial dimension is within the iliac fossa on the right measuring 6.7 x 8.6 cm. Small amount of fluid in the pelvis. No free air. VASCULATURE: Aorta is normal in caliber. LYMPH NODES: No lymphadenopathy. REPRODUCTIVE ORGANS: Uterus is no longer normal. BONES AND SOFT TISSUES: No acute osseous abnormality. No focal soft tissue abnormality. IMPRESSION: 1. Persistent low density fluid collection in the right pericardial gutter similar to comparison MRI with the greatest dimension of volume within the right iliac fossa. On comparison MRI the collection appeared mildly complex and organized. No iv contrast on current exam. 2. Moderate volume right pleural effusion similar to prior. 3. Gallbladder grossly normal on noncontrast exam. Electronically signed by: Norleen Boxer MD 10/25/2024 11:17 AM EST RP Workstation: [id number]   NM Hepatobiliary Liver Func Result Date: 10/24/2024 EXAM: NM HEPATOBILLARY SCAN 10/24/2024 05:35:58 PM TECHNIQUE: RADIOPHARMACEUTICAL: 5.5 mCi Tc-25m mebrofenin  Anterior planar images of the abdomen and pelvis were obtained over approximately 1 hour. 2.3 mg Morphine  given intravenously  Dynamic imaging of the abdomen is obtained COMPARISON: MRI 10/22/2024, ultrasound 10/13/2024 CLINICAL HISTORY: Biliary colic, recurrent, gallbladder dyskinesia suspected FINDINGS:  Homogenous uptake within the liver. Normal clearance of the blood pool. Appropriate excretion into the biliary system. Activity is visualized in the small bowel. Activity is visualized in the gallbladder following morphine  administration IMPRESSION: 1. Negative for acute cholecystitis ( gallbladder visualized after morphine  administration ) 2. Normal biliary to bowel transit. Electronically signed by: Luke Bun MD 10/24/2024 06:54 PM EST RP Workstation: [id number]     Signature  -    Brayton Lye M.D on 10/26/2024 at 1:02 PM   -  To page go to [email or web address]

## 2024-10-26 NOTE — Plan of Care (Signed)

## 2024-10-27 DIAGNOSIS — K769 Liver disease, unspecified: Secondary | ICD-10-CM | POA: Diagnosis not present

## 2024-10-27 DIAGNOSIS — K651 Peritoneal abscess: Secondary | ICD-10-CM | POA: Diagnosis not present

## 2024-10-27 LAB — COMPREHENSIVE METABOLIC PANEL WITH GFR
ALT: 20 U/L (ref 0–44)
AST: 35 U/L (ref 15–41)
Albumin: 2.1 g/dL — ABNORMAL LOW (ref 3.5–5.0)
Alkaline Phosphatase: 50 U/L (ref 38–126)
Anion gap: 8 (ref 5–15)
BUN: 5 mg/dL — ABNORMAL LOW (ref 8–23)
CO2: 23 mmol/L (ref 22–32)
Calcium: 9.1 mg/dL (ref 8.9–10.3)
Chloride: 108 mmol/L (ref 98–111)
Creatinine, Ser: 1.36 mg/dL — ABNORMAL HIGH (ref 0.44–1.00)
GFR, Estimated: 38 mL/min — ABNORMAL LOW (ref >60)
Glucose, Bld: 88 mg/dL (ref 70–99)
Potassium: 4.1 mmol/L (ref 3.5–5.1)
Sodium: 139 mmol/L (ref 135–145)
Total Bilirubin: 0.6 mg/dL (ref 0.0–1.2)
Total Protein: 5.5 g/dL — ABNORMAL LOW (ref 6.5–8.1)

## 2024-10-27 LAB — CBC WITH DIFFERENTIAL/PLATELET
Abs Immature Granulocytes: 0.08 K/uL — ABNORMAL HIGH (ref 0.00–0.07)
Basophils Absolute: 0.1 K/uL (ref 0.0–0.1)
Basophils Relative: 1 %
Eosinophils Absolute: 0 K/uL (ref 0.0–0.5)
Eosinophils Relative: 1 %
HCT: 29.2 % — ABNORMAL LOW (ref 36.0–46.0)
Hemoglobin: 9.5 g/dL — ABNORMAL LOW (ref 12.0–15.0)
Immature Granulocytes: 1 %
Lymphocytes Relative: 18 %
Lymphs Abs: 1.1 K/uL (ref 0.7–4.0)
MCH: 27.1 pg (ref 26.0–34.0)
MCHC: 32.5 g/dL (ref 30.0–36.0)
MCV: 83.2 fL (ref 80.0–100.0)
Monocytes Absolute: 0.7 K/uL (ref 0.1–1.0)
Monocytes Relative: 11 %
Neutro Abs: 4.4 K/uL (ref 1.7–7.7)
Neutrophils Relative %: 68 %
Platelets: 373 K/uL (ref 150–400)
RBC: 3.51 MIL/uL — ABNORMAL LOW (ref 3.87–5.11)
RDW: 17.9 % — ABNORMAL HIGH (ref 11.5–15.5)
WBC: 6.4 K/uL (ref 4.0–10.5)
nRBC: 0 % (ref 0.0–0.2)

## 2024-10-27 LAB — PROCALCITONIN: Procalcitonin: 0.15 ng/mL

## 2024-10-27 MED ORDER — SODIUM CHLORIDE 0.9% FLUSH
5.0000 mL | Freq: Three times a day (TID) | INTRAVENOUS | Status: DC
  Administered 2024-10-27 – 2024-10-31 (×13): 5 mL

## 2024-10-27 NOTE — Plan of Care (Signed)
  Problem: Clinical Measurements: Goal: Diagnostic test results will improve Outcome: Progressing   Problem: Nutrition: Goal: Adequate nutrition will be maintained Outcome: Progressing   Problem: Pain Managment: Goal: General experience of comfort will improve and/or be controlled Outcome: Progressing   Problem: Safety: Goal: Ability to remain free from injury will improve Outcome: Progressing

## 2024-10-27 NOTE — Progress Notes (Addendum)
 PROGRESS NOTE        PATIENT DETAILS Name: Haley Randall Age: 88 y.o. Sex: female Date of Birth: 1936-03-05 Admit Date: 10/13/2024 Admitting Physician [name], MD ERE:Ilhjo, Ginger, FNP  Brief Summary: Patient is a 88 y.o.  female with history of HTN, HLD-who presented with abdominal pain-found to have pancreatitis/vague liver lesions on CT abdomen.  Significant events: 11/13>> admit to [facility]  Significant studies: 11/13>> CXR: No PNA 11/13>> CT abdomen/pelvis: 4.8 x 3.1 cm subtle lesion-right liver-additional small lesions measuring up to 2.1 cm.  Gallbladder distended.  Edema/inflammation along the descending ascending colon/hepatic flexure. 11/13>> RUQ ultrasound: Gallbladder wall upper normal to borderline thickened, no echogenic gallstones.  CBD 8 mm. 11/14>> MRI abdomen: Multicystic enhancing lesions in the liver-abscesses versus metastatic disease 10/17/2024.  CT-guided liver biopsy by IR   10/22/24 - repeat MRCP -  1. Progressive complex fluid accumulation within the right hemiabdomen with early loculation. The complex fluid shows increased T1 signal, which may be seen with underlying concentrated bile duct, blood products or proteinaceous material. A nuclear medicine HIDA scan may be helpful to exclude bile leak. 2. New gallbladder wall edema with pericholecystic fluid. No gallstones identified. 3. Persistent area of hyperemia surrounding multiple cystic areas in the periphery of segment 7 and dome of liver which may re represent multiple dilated biliary radicles in the setting of cholangitis. No discrete drainable abscess or mass noted. 4. Interval increase in volume of right pleural effusion, now moderate in volume, with overlying atelectasis or consolidation. 11/24>> HIDA scan nonacute 11/25>> right upper quadrant drain placement by IR   Significant microbiology data:  11/13>> COVID/influenza/RSV PCR: Negative 11/13>> urine culture: Multiple  species 11/13>> 1/2: Gram-positive cocci (prelim Staph epidermidis)-likely contamination. 11/14>> acute hepatitis serology: Negative 11/14>> RPR: Nonreactive  Consults:  General surgery IR  gastroenterology called x 2, Dr. San.  They have nothing to offer in terms of doing a consultation.  Subjective:   She denies any abdominal pain today, no nausea, no vomiting  Objective: Vitals: Blood pressure (!) 141/52, pulse 75, temperature (!) 97.3 F (36.3 C), temperature source Oral, resp. rate 15, height 4' 11 (1.499 m), weight 58.2 kg, SpO2 95%.   Exam:  Awake Alert, Oriented X 3, No new F.N deficits, Normal affect Symmetrical Chest wall movement, Good air movement bilaterally, CTAB RRR,No Gallops,Rubs or new Murmurs, No Parasternal Heave +ve B.Sounds, Abd Soft, No tenderness, right upper quadrant drain with sanguinous output No Cyanosis, Clubbing, +1edema  Assessment/Plan:  acute pancreatitis  Gastric abdominal pain  asymptomatic transaminitis and mildly elevated bilirubin levels Multicystic enhancing liver lesion.   Possible colitis. With supportive care acute pancreatitis completely resolved, no gallstones on imaging, no history of alcohol use, MRCP unremarkable in terms of no choledocholithiasis-no major pancreatic abnormality on MRI.  HIDA scan unremarkable, on empiric antibiotic for questionable colitis and possible abscess.  Has been seen by general surgery and GI, also being evaluated by IR.  Underwent liver biopsy which was nonmalignant, specimen was insufficient for culture or sensitivity. Patient has been maintained on IV antibiotics, clinically stable, no right upper quadrant pain, MRCP and HIDA scan unrevealing, per general surgery and IR repeating CT abdomen pelvis on 10/25/2024 to check for any fluid collection, general surgery to be reconsulted again on 10/25/2024 per IR request.  Continue to monitor.  Clinically no pancreatitis, no active evidence of  active colitis clinically. - General Surgery input greatly appreciated, status post drain placement by IR 11/26, follow on cultures, amylase and bilirubin, so for no growth.  So I will discontinue IV Zosyn  and watch off antibiotics.   Gram-positive bacteremia-coag negative staph (preliminary result)  likely a contaminant, 1 out of 2 blood cultures, other one showed no growth for completion  HTN Systolic blood pressures remain elevated but diastolics remain low which has been a problem in the past No return of tachycardia after starting metoprolol  Hold off on resuming amlodipine  Trial TED stockings, issues with orthostatic hypotension in the past  NAGMA Prerenal AKI on CKD stage IIIa/b Bicarb continues to fluctuate between 16 and 19 Likely 2/2 renal dysfunction, which has been stable at baseline over the past few days after initial AKI Continue to monitor   Normocytic anemia No recent baseline but hemoglobin had been normal over 1 year ago Hemoglobin 11 on admission and dropped to 7.3 on 11/19, s/p 1 unit PRBCs on 11/19 Hemoglobin stable at 9.6 this morning Iron labs most consistent with anemia of chronic disease Continue ferrous sulfate   Hypomagnesemia, hypokalemia, hypophosphatemia Continue to monitor and replete as needed  ?  Mild dementia/cognitive issues B12/RPR all stable Ammonia level is only minimally elevated-doubt of any clinical significance. No confusion this morning-continue to maintain delirium precautions  Elevated TSH TSH 5.8 with T4 1.04.  Likely sick euthyroid and will need repeat TSH in 4 to 6 weeks  Palliative care Previously had long discussion with the patient/daughter regarding findings on MRI that could represent metastatic disease.  Underwent liver biopsy 11/17 and awaiting results but if metastatic disease they seem to be leaning towards stopping further workup but will continue to discuss  Code status:   Code Status: Full Code   DVT  Prophylaxis: Place TED hose Start: 10/19/24 0932 enoxaparin  (LOVENOX ) injection 30 mg Start: 10/18/24 1000 Place and maintain sequential compression device Start: 10/16/24 0843   Family Communication: None at bedside  Disposition Plan:  The patient will require care spanning > 2 midnights and should be moved to inpatient because: Severity of illness   Planned Discharge Destination:Home   Diet: Diet Order             DIET SOFT Room service appropriate? Yes; Fluid consistency: Thin  Diet effective now                    MEDICATIONS: Scheduled Meds:  amLODipine   10 mg Oral Daily   enoxaparin  (LOVENOX ) injection  30 mg Subcutaneous Daily   feeding supplement  237 mL Oral BID BM   ferrous sulfate   325 mg Oral Q breakfast   folic acid   1 mg Oral Daily   metoprolol  tartrate  50 mg Oral BID   multivitamin with minerals  1 tablet Oral Daily   pantoprazole   40 mg Oral BID   Continuous Infusions:   PRN Meds:.acetaminophen , albuterol , diltiazem , fentaNYL  (SUBLIMAZE ) injection, hydrALAZINE , LORazepam , ondansetron  **OR** ondansetron  (ZOFRAN ) IV, sodium chloride  flush   I have personally reviewed following labs and imaging studies  LABORATORY DATA:   Data Review:   Inpatient Medications  Scheduled Meds:  amLODipine   10 mg Oral Daily   enoxaparin  (LOVENOX ) injection  30 mg Subcutaneous Daily   feeding supplement  237 mL Oral BID BM   ferrous sulfate   325 mg Oral Q breakfast   folic acid   1 mg Oral Daily   metoprolol  tartrate  50 mg Oral BID   multivitamin with minerals  1 tablet Oral Daily   pantoprazole   40 mg Oral BID   Continuous Infusions:   PRN Meds:.acetaminophen , albuterol , diltiazem , fentaNYL  (SUBLIMAZE ) injection, hydrALAZINE , LORazepam , ondansetron  **OR** ondansetron  (ZOFRAN ) IV, sodium chloride  flush  DVT Prophylaxis  Place TED hose Start: 10/19/24 0932 enoxaparin  (LOVENOX ) injection 30 mg Start: 10/18/24 1000 Place and maintain sequential compression  device Start: 10/16/24 0843   Recent Labs  Lab 10/22/24 0320 10/24/24 1123 10/25/24 0020 10/26/24 0245 10/27/24 0333  WBC 12.9* 11.8* 9.3 6.7 6.4  HGB 9.0* 11.8* 9.4* 9.6* 9.5*  HCT 27.8* 35.9* 29.4* 29.4* 29.2*  PLT 234 297 267 307 373  MCV 83.0 81.2 83.3 82.4 83.2  MCH 26.9 26.7 26.6 26.9 27.1  MCHC 32.4 32.9 32.0 32.7 32.5  RDW 17.2* 17.2* 17.4* 17.3* 17.9*  LYMPHSABS 0.7 0.6* 0.6* 0.7 1.1  MONOABS 0.9 0.5 0.6 0.7 0.7  EOSABS 0.1 0.0 0.0 0.0 0.0  BASOSABS 0.0 0.0 0.0 0.0 0.1    Recent Labs  Lab 10/21/24 0255 10/22/24 0320 10/24/24 1123 10/24/24 1223 10/25/24 0020 10/26/24 0245 10/27/24 0333  NA 135 140 136  --  136 138 139  K 4.4 3.6 3.3*  --  3.3* 4.2 4.1  CL 105 106 100  --  103 105 108  CO2 16* 17* 19*  --  20* 23 23  ANIONGAP 14 17* 17*  --  13 10 8   GLUCOSE 79 82 88  --  103* 95 88  BUN 10 7* 7*  --  6* <5* 5*  CREATININE 1.38* 1.53* 1.24*  --  1.23* 1.24* 1.36*  AST 27 24 37  --  32 31 35  ALT 28 24 27   --  22 22 20   ALKPHOS 80 57 71  --  54 54 50  BILITOT 1.6* 1.5* 1.2  --  1.1 0.9 0.6  ALBUMIN 2.2* 2.0* 2.5*  --  2.0* 2.0* 2.1*  CRP  --   --  15.7*  --  11.4* 8.9*  --   PROCALCITON  --  0.47 0.19  --  0.34 0.19 0.15  INR  --   --   --  1.2 1.2  --   --   MG 1.9  --  1.7  --  2.2 1.9  --   PHOS 2.1*  --   --   --   --   --   --   CALCIUM  9.4 9.5 9.3  --  8.9 9.3 9.1      Recent Labs  Lab 10/21/24 0255 10/22/24 0320 10/24/24 1123 10/24/24 1223 10/25/24 0020 10/26/24 0245 10/27/24 0333  CRP  --   --  15.7*  --  11.4* 8.9*  --   PROCALCITON  --  0.47 0.19  --  0.34 0.19 0.15  INR  --   --   --  1.2 1.2  --   --   MG 1.9  --  1.7  --  2.2 1.9  --   CALCIUM  9.4 9.5 9.3  --  8.9 9.3 9.1    --------------------------------------------------------------------------------------------------------------- Lab Results  Component Value Date   CHOL 157 09/01/2023   HDL 86 09/01/2023   LDLCALC 57 09/01/2023   TRIG 74 09/01/2023   CHOLHDL 1.8  09/01/2023    Lab Results  Component Value Date   HGBA1C 6.2 10/02/2022   Radiology Reports  CT GUIDED PERITONEAL/RETROPERITONEAL FLUID DRAIN BY PERC CATH Result Date: 10/25/2024 INDICATION: 88 year old female with enlarging right retroperitoneal fluid collection. EXAM: CT PERC DRAIN  PERITONEAL ABCESS COMPARISON:  CT abdomen pelvis from earlier the same day MEDICATIONS: The patient is currently admitted to the hospital and receiving intravenous antibiotics. The antibiotics were administered within an appropriate time frame prior to the initiation of the procedure. ANESTHESIA/SEDATION: Moderate (conscious) sedation was employed during this procedure. A total of Versed  1 mg and Fentanyl  50 mcg was administered intravenously. Moderate Sedation Time: 14 minutes. The patient's level of consciousness and vital signs were monitored continuously by radiology nursing throughout the procedure under my direct supervision. CONTRAST:  None COMPLICATIONS: None immediate. PROCEDURE: RADIATION DOSE REDUCTION: This exam was performed according to the departmental dose-optimization program which includes automated exposure control, adjustment of the mA and/or kV according to patient size and/or use of iterative reconstruction technique. Informed written consent was obtained from the patient after a discussion of the risks, benefits and alternatives to treatment. The patient was placed supine on the CT gantry and a pre procedural CT was performed re-demonstrating the known abscess/fluid collection within the right retroperitoneum. The procedure was planned. A timeout was performed prior to the initiation of the procedure. The right lower abdominal mid axillary region was prepped and draped in the usual sterile fashion. The overlying soft tissues were anesthetized with 1% lidocaine  with epinephrine. Appropriate trajectory was planned with the use of a 22 gauge spinal needle. An 18 gauge trocar needle was advanced into the  abscess/fluid collection and a short Amplatz super stiff wire was coiled within the collection. Appropriate positioning was confirmed with a limited CT scan. The tract was serially dilated allowing placement of a 10 French all-purpose drainage catheter. Appropriate positioning was confirmed with a limited postprocedural CT scan. Approximately 100 ml of thin, brown fluid was aspirated. The tube was connected to a bulb suction and sutured in place. A dressing was placed. The patient tolerated the procedure well without immediate post procedural complication. IMPRESSION: Successful CT guided placement of a 10 French all purpose drain catheter into the right retroperitoneal fluid collection with aspiration of approximately 100 mL of thin brown fluid. Samples were sent to the laboratory as requested by the ordering clinical team. Ester Sides, MD [facility] Electronically Signed   By: Ester Sides M.D.   On: 10/25/2024 18:14     Signature  -    Brayton Lye M.D on 10/27/2024 at 1:27 PM   -  To page go to [email or web address]

## 2024-10-28 ENCOUNTER — Inpatient Hospital Stay (HOSPITAL_COMMUNITY)

## 2024-10-28 DIAGNOSIS — I700 Atherosclerosis of aorta: Secondary | ICD-10-CM | POA: Diagnosis not present

## 2024-10-28 DIAGNOSIS — Z48813 Encounter for surgical aftercare following surgery on the respiratory system: Secondary | ICD-10-CM | POA: Diagnosis not present

## 2024-10-28 DIAGNOSIS — K769 Liver disease, unspecified: Secondary | ICD-10-CM | POA: Diagnosis not present

## 2024-10-28 DIAGNOSIS — K651 Peritoneal abscess: Secondary | ICD-10-CM | POA: Diagnosis not present

## 2024-10-28 LAB — BODY FLUID CELL COUNT WITH DIFFERENTIAL
Eos, Fluid: 1 %
Lymphs, Fluid: 30 %
Monocyte-Macrophage-Serous Fluid: 66 % (ref 50–90)
Neutrophil Count, Fluid: 3 % (ref 0–25)
Total Nucleated Cell Count, Fluid: 336 uL (ref 0–1000)

## 2024-10-28 LAB — LACTATE DEHYDROGENASE: LDH: 373 U/L — ABNORMAL HIGH (ref 105–235)

## 2024-10-28 LAB — CYTOLOGY - NON PAP

## 2024-10-28 LAB — LACTATE DEHYDROGENASE, PLEURAL OR PERITONEAL FLUID: LD, Fluid: 154 U/L — ABNORMAL HIGH (ref 3–23)

## 2024-10-28 LAB — MISC LABCORP TEST (SEND OUT): Labcorp test code: 88062

## 2024-10-28 LAB — PROTEIN, PLEURAL OR PERITONEAL FLUID: Total protein, fluid: <3 g/dL

## 2024-10-28 LAB — PROTEIN, TOTAL: Total Protein: 7.1 g/dL (ref 6.5–8.1)

## 2024-10-28 MED ORDER — LIDOCAINE-EPINEPHRINE 1 %-1:100000 IJ SOLN
20.0000 mL | Freq: Once | INTRAMUSCULAR | Status: AC
  Administered 2024-10-28: 20 mL via INTRADERMAL

## 2024-10-28 MED ORDER — LIDOCAINE-EPINEPHRINE 1 %-1:100000 IJ SOLN
INTRAMUSCULAR | Status: AC
  Filled 2024-10-28: qty 1

## 2024-10-28 NOTE — Plan of Care (Signed)

## 2024-10-28 NOTE — Procedures (Signed)
 PROCEDURE SUMMARY:  Successful image-guided right thoracentesis. Yielded 350 mL of clear dark yellow fluid. Patient tolerated procedure well. EBL: trace No immediate complications.  Specimen was sent for labs. Post procedure CXR ordered.  Please see imaging section of Epic for full dictation.  Kimble DEL Talyia Allende PA-C 10/28/2024 12:58 PM

## 2024-10-28 NOTE — Progress Notes (Signed)
 Referring Physician(s): Almarie Pringle, PA-C   Supervising Physician: Jenna Hacker  Patient Status:  [facility] - In-pt  Chief Complaint:  S/p abd fluid collection drain placed 10/25/24 by Dr. Jennefer   Subjective:  Laying in bed, NAD, no complaints today.    Allergies: Lisinopril   Medications: Prior to Admission medications   Not on File     Vital Signs: BP (!) 164/49 (BP Location: Left Arm)   Pulse 66   Temp 97.9 F (36.6 C) (Oral)   Resp 14   Ht 4' 11 (1.499 m)   Wt 128 lb 4.9 oz (58.2 kg)   SpO2 97%   BMI 25.91 kg/m   Physical Exam Vitals reviewed.  Constitutional:      General: She is not in acute distress.    Appearance: She is not ill-appearing.  HENT:     Head: Normocephalic and atraumatic.  Pulmonary:     Effort: Pulmonary effort is normal.  Abdominal:     General: Abdomen is flat.     Comments: Positive RLQ drain to a suction bulb. Site is unremarkable with no erythema, edema, tenderness, bleeding or drainage. Suture and stat lock in place. Dressing is clean, dry, and intact. ~10 ml of  dark brown colored fluid noted in the bulb. Drain aspirates and flushes well.    Skin:    General: Skin is warm and dry.     Coloration: Skin is not cyanotic or jaundiced.  Neurological:     Mental Status: She is alert.  Psychiatric:        Mood and Affect: Mood normal.        Behavior: Behavior normal.     Imaging: CT GUIDED PERITONEAL/RETROPERITONEAL FLUID DRAIN BY PERC CATH Result Date: 10/25/2024 INDICATION: 88 year old female with enlarging right retroperitoneal fluid collection. EXAM: CT PERC DRAIN PERITONEAL ABCESS COMPARISON:  CT abdomen pelvis from earlier the same day MEDICATIONS: The patient is currently admitted to the hospital and receiving intravenous antibiotics. The antibiotics were administered within an appropriate time frame prior to the initiation of the procedure. ANESTHESIA/SEDATION: Moderate (conscious) sedation was employed during  this procedure. A total of Versed  1 mg and Fentanyl  50 mcg was administered intravenously. Moderate Sedation Time: 14 minutes. The patient's level of consciousness and vital signs were monitored continuously by radiology nursing throughout the procedure under my direct supervision. CONTRAST:  None COMPLICATIONS: None immediate. PROCEDURE: RADIATION DOSE REDUCTION: This exam was performed according to the departmental dose-optimization program which includes automated exposure control, adjustment of the mA and/or kV according to patient size and/or use of iterative reconstruction technique. Informed written consent was obtained from the patient after a discussion of the risks, benefits and alternatives to treatment. The patient was placed supine on the CT gantry and a pre procedural CT was performed re-demonstrating the known abscess/fluid collection within the right retroperitoneum. The procedure was planned. A timeout was performed prior to the initiation of the procedure. The right lower abdominal mid axillary region was prepped and draped in the usual sterile fashion. The overlying soft tissues were anesthetized with 1% lidocaine  with epinephrine. Appropriate trajectory was planned with the use of a 22 gauge spinal needle. An 18 gauge trocar needle was advanced into the abscess/fluid collection and a short Amplatz super stiff wire was coiled within the collection. Appropriate positioning was confirmed with a limited CT scan. The tract was serially dilated allowing placement of a 10 French all-purpose drainage catheter. Appropriate positioning was confirmed with a limited postprocedural CT scan.  Approximately 100 ml of thin, brown fluid was aspirated. The tube was connected to a bulb suction and sutured in place. A dressing was placed. The patient tolerated the procedure well without immediate post procedural complication. IMPRESSION: Successful CT guided placement of a 10 French all purpose drain catheter into  the right retroperitoneal fluid collection with aspiration of approximately 100 mL of thin brown fluid. Samples were sent to the laboratory as requested by the ordering clinical team. Ester Sides, MD [facility] Electronically Signed   By: Ester Sides M.D.   On: 10/25/2024 18:14   CT ABDOMEN PELVIS WO CONTRAST Result Date: 10/25/2024 EXAM: CT ABDOMEN AND PELVIS WITHOUT CONTRAST 10/25/2024 10:34:25 AM TECHNIQUE: CT of the abdomen and pelvis was performed without the administration of intravenous contrast. Multiplanar reformatted images are provided for review. Automated exposure control, iterative reconstruction, and/or weight-based adjustment of the mA/kV was utilized to reduce the radiation dose to as low as reasonably achievable. COMPARISON: MRI 10/22/2024, CT 10/13/2024. CLINICAL HISTORY: Abdominal pain, acute, nonlocalized; Intra-abdominal fluid collection nonspecific source. FINDINGS: LOWER CHEST: Moderate right pleural effusion unchanged from MRI. LIVER: Hypodensities in the right hepatic lobe again noted. GALLBLADDER AND BILE DUCTS: Gallbladder is grossly normal on noncontrast exam. No biliary ductal dilatation. SPLEEN: No acute abnormality. PANCREAS: No acute abnormality. ADRENAL GLANDS: No acute abnormality. KIDNEYS, URETERS AND BLADDER: No stones in the kidneys or ureters. No hydronephrosis. No perinephric or periureteral stranding. Urinary bladder is unremarkable. GI AND BOWEL: Stomach demonstrates no acute abnormality. There is no bowel obstruction. PERITONEUM AND RETROPERITONEUM: Fluid collection along the right paracolic gutter extending from the inferior margin of the right hepatic lobe into the iliac fossa. Low density fluid. The dimensions are similar to comparison MRI. There is some organization noted on comparison MRI. The greatest volume in axial dimension is within the iliac fossa on the right measuring 6.7 x 8.6 cm. Small amount  of fluid in the pelvis. No free air. VASCULATURE: Aorta is normal in caliber. LYMPH NODES: No lymphadenopathy. REPRODUCTIVE ORGANS: Uterus is no longer normal. BONES AND SOFT TISSUES: No acute osseous abnormality. No focal soft tissue abnormality. IMPRESSION: 1. Persistent low density fluid collection in the right pericardial gutter similar to comparison MRI with the greatest dimension of volume within the right iliac fossa. On comparison MRI the collection appeared mildly complex and organized. No iv contrast on current exam. 2. Moderate volume right pleural effusion similar to prior. 3. Gallbladder grossly normal on noncontrast exam. Electronically signed by: Norleen Boxer MD 10/25/2024 11:17 AM EST RP Workstation: [id number]   NM Hepatobiliary Liver Func Result Date: 10/24/2024 EXAM: NM HEPATOBILLARY SCAN 10/24/2024 05:35:58 PM TECHNIQUE: RADIOPHARMACEUTICAL: 5.5 mCi Tc-40m mebrofenin  Anterior planar images of the abdomen and pelvis were obtained over approximately 1 hour. 2.3 mg Morphine  given intravenously  Dynamic imaging of the abdomen is obtained COMPARISON: MRI 10/22/2024, ultrasound 10/13/2024 CLINICAL HISTORY: Biliary colic, recurrent, gallbladder dyskinesia suspected FINDINGS: Homogenous uptake within the liver. Normal clearance of the blood pool. Appropriate excretion into the biliary system. Activity is visualized in the small bowel. Activity is visualized in the gallbladder following morphine  administration IMPRESSION: 1. Negative for acute cholecystitis ( gallbladder visualized after morphine  administration ) 2. Normal biliary to bowel transit. Electronically signed by: Luke Bun MD 10/24/2024 06:54 PM EST RP Workstation: [id number]    Labs:  CBC: Recent Labs    10/24/24 1123 10/25/24 0020 10/26/24 0245 10/27/24 0333  WBC 11.8* 9.3 6.7 6.4  HGB 11.8* 9.4* 9.6* 9.5*  HCT 35.9* 29.4* 29.4* 29.2*  PLT 297 267 307 373    COAGS: Recent Labs    10/13/24 0517 10/17/24 0404  10/24/24 1223 10/25/24 0020  INR 1.1 1.1 1.2 1.2    BMP: Recent Labs    10/24/24 1123 10/25/24 0020 10/26/24 0245 10/27/24 0333  NA 136 136 138 139  K 3.3* 3.3* 4.2 4.1  CL 100 103 105 108  CO2 19* 20* 23 23  GLUCOSE 88 103* 95 88  BUN 7* 6* <5* 5*  CALCIUM  9.3 8.9 9.3 9.1  CREATININE 1.24* 1.23* 1.24* 1.36*  GFRNONAA 42* 43* 42* 38*    LIVER FUNCTION TESTS: Recent Labs    10/24/24 1123 10/25/24 0020 10/26/24 0245 10/27/24 0333  BILITOT 1.2 1.1 0.9 0.6  AST 37 32 31 35  ALT 27 22 22 20   ALKPHOS 71 54 54 50  PROT 6.9 5.3* 5.3* 5.5*  ALBUMIN 2.5* 2.0* 2.0* 2.1*    Assessment and Plan:  88 y.o. female with acute pancreatitis  and R RP fluid collection s/p RUQ drain placement by Dr. Jennefer on 10/25/24 - aspirated 100 mL of thin, dark sanguinous fluid.   VSS CBC yesterday stable, no leukocytosis  Cx negative so far  Output 55 mL, appears thin, dark brown/maroon   Drain Location: RUQ Size: Fr size: 10 Fr Date of placement: 11/25  Currently to: Drain collection device: suction bulb 24 hour output:  Output by Drain (mL) 10/26/24 0701 - 10/26/24 1900 10/26/24 1901 - 10/27/24 0700 10/27/24 0701 - 10/27/24 1900 10/27/24 1901 - 10/28/24 0700 10/28/24 0701 - 10/28/24 0902  Closed System Drain Lateral;Right Abdomen Bulb (JP) 10 Fr. 55 25 25 30      Interval imaging/drain manipulation:  None   Current examination: Flushes/aspirates easily.  Insertion site unremarkable. Suture and stat lock in place. Dressed appropriately.   Plan: Continue TID flushes with 5 cc NS. Record output Q shift. Dressing changes QD or PRN if soiled.  Call IR APP or on call IR MD if difficulty flushing or sudden change in drain output.  Repeat imaging/possible drain injection once output < 10 mL/QD (excluding flush material). Consideration for drain removal if output is < 10 mL/QD (excluding flush material), pending discussion with the providing surgical service.  Discharge  planning: Please contact IR APP or on call IR MD prior to patient d/c to ensure appropriate follow up plans are in place. Typically patient will follow up with IR clinic 10-14 days post d/c for repeat imaging/possible drain injection. IR scheduler will contact patient with date/time of appointment. Patient will need to flush drain QD with 5 cc NS, record output QD, dressing changes every 2-3 days or earlier if soiled.   IR will continue to follow - please call with questions or concerns.  Electronically Signed: Toya VEAR Cousin, PA-C 10/28/2024, 9:00 AM   I spent a total of 15 Minutes at the the patient's bedside AND on the patient's hospital floor or unit, greater than 50% of which was counseling/coordinating care for RUQ drain f/u.   This chart was dictated using voice recognition software.  Despite best efforts to proofread,  errors can occur which can change the documentation meaning.

## 2024-10-28 NOTE — Progress Notes (Signed)
 Nutrition Follow-up  DOCUMENTATION CODES:   Non-severe (moderate) malnutrition in context of acute illness/injury  INTERVENTION:  Continue snacks TID between meals to augment calorie/protein intake  D/C Ensure Plus High Protein as patient refusing and previously stated she does not prefer   Continue liberalized diet to maximize PO intake             -Assistance w/ meal ordering to avoid missed meals   Continue MVI w/ minerals  Draw PHOS tomorrow morning to assess repletion efforts  NUTRITION DIAGNOSIS:   Moderate Malnutrition related to acute illness as evidenced by mild muscle depletion, mild fat depletion.  GOAL:  Patient will meet greater than or equal to 90% of their needs  MONITOR:  PO intake, Skin, Labs, Supplement acceptance  REASON FOR ASSESSMENT:  Malnutrition Screening Tool    ASSESSMENT:   Pt with PMH significant for: HTN, CKD III, HLD. Presented with abdominal pain and found to have pancreatitis and lever lesions on CT of abdomen.  11/13 - admitted, clear liquid diet, CT abdomen: 4.8 x 3.1 cm lesion to right side of liver w/ additional small lesions noted, distended gallbladder, edema/inflammation along descending/ascending colon/hepatic flexure 11/14 - MRI abdomen: multicystic enhancing lesions in liver - abscesses vs mets 11/15 - full liquid diet 11/17 - liver biopsy: pathology still pending 11/18 - regular diet  11/19 - 1 unit PRBCs transfused 11/22 - repeat MRCP: fluid accumulation in R hemiabdomen 11/24 - HIDA scan non acute; GI soft diet 11/25 - right upper quadrant drain placement by IR 11/28 - thoracentesis: yield   Liver biopsy pathology still pending. Abdominal drain placed by IR on 11/25 due to fluid accumulation seen on repeat MRCP. Plan for repeat imaging and possible drain injection when output <37ml/QD Output yesterday noted at 55ml.  She is more alert and up in chair at bedside this morning. Endorses improved appetite/intake with  introduction of snacks. Amicable to leaving these in place.  Average Meal Intake No recent meal intake to review   Discussed importance of continuing to maintain intake to prevent further loss of lean body mass. She verbalizes understanding. No difficulties reported with chewing or swallowing. Bowels have slowed. No documented BM x3 days. Monitor need for bowel regimen.   Admit Weight: 52.3 kg Current Weight: 58.2 kg   New weight collected. Trend up this admission. Unsure of volume status at time of new weight collection as no edema documented on 11/22. New weight is more in line with patient usual body weight. Will continue to monitor trend. Did have thoracentesis today with yield. She is with 1+ edema to BLEs.    Drains/Lines: R basilic: midline, single lumen R abdomen: JP drain (10Fr): 55 mL x24 hours  Meds: ferrous sulfate , folic acid , pantoprazole , MVI  Has required potassium, phosphorus, and magnesium  supplementation while admitted, but recently. Recommend PHOS draw tomorrow to assess repletion efforts. Likely adequate as intake has improved.   Labs from 11/27 reviewed:  Na+ 139 (wdl) K+ 4.1 (wdl) Mg 1.6>1.9>1.9 (wdl) CRP 8.9 (H) TSH 5.880 (H) T4 1.04 (wdl) CBGs 88-95 x24 hours   Diet Order:   Diet Order             DIET SOFT Room service appropriate? Yes; Fluid consistency: Thin  Diet effective now            EDUCATION NEEDS:  Education needs have been addressed  Skin:  Skin Assessment: Reviewed RN Assessment  Last BM:  11/25  Height:  Ht Readings  from Last 1 Encounters:  10/14/24 4' 11 (1.499 m)   Weight:  Wt Readings from Last 1 Encounters:  10/22/24 58.2 kg   Ideal Body Weight:  43.2 kg  BMI:  Body mass index is 25.91 kg/m.  Estimated Nutritional Needs:   Kcal:  1300-1500  Protein:  60-75g  Fluid:  1.3-1.5L/day  Blair Deaner MS, RD, LDN Registered Dietitian Clinical Nutrition RD Inpatient Contact Info in Amion

## 2024-10-28 NOTE — Plan of Care (Signed)
  Problem: Clinical Measurements: Goal: Will remain free from infection Outcome: Progressing Goal: Diagnostic test results will improve Outcome: Progressing   Problem: Activity: Goal: Risk for activity intolerance will decrease Outcome: Progressing   Problem: Nutrition: Goal: Adequate nutrition will be maintained Outcome: Progressing

## 2024-10-28 NOTE — Progress Notes (Signed)
 PROGRESS NOTE        PATIENT DETAILS Name: Haley Randall Age: 88 y.o. Sex: female Date of Birth: 1935-12-10 Admit Date: 10/13/2024 Admitting Physician [name], MD ERE:Ilhjo, Ginger, FNP  Brief Summary: Patient is a 88 y.o.  female with history of HTN, HLD-who presented with abdominal pain-found to have pancreatitis/vague liver lesions on CT abdomen.  Significant events: 11/13>> admit to [facility]  Significant studies: 11/13>> CXR: No PNA 11/13>> CT abdomen/pelvis: 4.8 x 3.1 cm subtle lesion-right liver-additional small lesions measuring up to 2.1 cm.  Gallbladder distended.  Edema/inflammation along the descending ascending colon/hepatic flexure. 11/13>> RUQ ultrasound: Gallbladder wall upper normal to borderline thickened, no echogenic gallstones.  CBD 8 mm. 11/14>> MRI abdomen: Multicystic enhancing lesions in the liver-abscesses versus metastatic disease 10/17/2024.  CT-guided liver biopsy by IR   10/22/24 - repeat MRCP -  1. Progressive complex fluid accumulation within the right hemiabdomen with early loculation. The complex fluid shows increased T1 signal, which may be seen with underlying concentrated bile duct, blood products or proteinaceous material. A nuclear medicine HIDA scan may be helpful to exclude bile leak. 2. New gallbladder wall edema with pericholecystic fluid. No gallstones identified. 3. Persistent area of hyperemia surrounding multiple cystic areas in the periphery of segment 7 and dome of liver which may re represent multiple dilated biliary radicles in the setting of cholangitis. No discrete drainable abscess or mass noted. 4. Interval increase in volume of right pleural effusion, now moderate in volume, with overlying atelectasis or consolidation. 11/24>> HIDA scan nonacute 11/25>> right upper quadrant drain placement by IR    Significant microbiology data:  11/13>> COVID/influenza/RSV PCR: Negative 11/13>> urine culture:  Multiple species 11/13>> 1/2: Gram-positive cocci (prelim Staph epidermidis)-likely contamination. 11/14>> acute hepatitis serology: Negative 11/14>> RPR: Nonreactive  Consults:  General surgery IR [facility]  Subjective:   As abdominal pain, no nausea, no vomiting, good appetite  Objective: Vitals: Blood pressure (!) 164/49, pulse 66, temperature 97.9 F (36.6 C), temperature source Oral, resp. rate 14, height 4' 11 (1.499 m), weight 58.2 kg, SpO2 97%.   Exam:  Awake Alert, Oriented X 3, No new F.N deficits, Normal affect Symmetrical Chest wall movement, limited air entry at right lung base RRR,No Gallops,Rubs or new Murmurs, No Parasternal Heave +ve B.Sounds, Abd Soft, No tenderness, upper quadrant drain present with strength venous output No Cyanosis, Clubbing or edema, No new Rash or bruise    Assessment/Plan:  acute pancreatitis  Gastric abdominal pain  asymptomatic transaminitis and mildly elevated bilirubin levels Multicystic enhancing liver lesion.   Possible colitis. With supportive care acute pancreatitis completely resolved, no gallstones on imaging, no history of alcohol use, MRCP unremarkable in terms of no choledocholithiasis-no major pancreatic abnormality on MRI.  HIDA scan unremarkable, on empiric antibiotic for questionable colitis and possible abscess.  Has been seen by general surgery and GI, also being evaluated by IR.  Underwent liver biopsy which was nonmalignant, specimen was insufficient for culture or sensitivity. Patient has been maintained on IV antibiotics, clinically stable, no right upper quadrant pain, MRCP and HIDA scan unrevealing, per general surgery and IR repeating CT abdomen pelvis on 10/25/2024 to check for any fluid collection, general surgery to be reconsulted again on 10/25/2024 per IR request.  Continue to monitor.  Clinically no pancreatitis, no active evidence of active colitis clinically. - General Surgery input  greatly  appreciated, status post drain placement by IR 11/26, follow on cultures, amylase and bilirubin, so for no growth.  IV Zosyn  discontinued 11/27, continue to monitor off antibiotics .  Right pleural effusion - Moderate size on recent imaging, likely sympathetic effusion, will request ultrasound thoracentesis   Gram-positive bacteremia-coag negative staph (preliminary result)  likely a contaminant, 1 out of 2 blood cultures, other one showed no growth for completion  HTN Systolic blood pressures remain elevated but diastolics remain low which has been a problem in the past No return of tachycardia after starting metoprolol  Hold off on resuming amlodipine  Trial TED stockings, issues with orthostatic hypotension in the past  NAGMA Prerenal AKI on CKD stage IIIa/b Bicarb continues to fluctuate between 16 and 19 Likely 2/2 renal dysfunction, which has been stable at baseline over the past few days after initial AKI Continue to monitor   Normocytic anemia No recent baseline but hemoglobin had been normal over 1 year ago Hemoglobin 11 on admission and dropped to 7.3 on 11/19, s/p 1 unit PRBCs on 11/19 Hemoglobin stable at 9.6 this morning Iron labs most consistent with anemia of chronic disease Continue ferrous sulfate   Hypomagnesemia, hypokalemia, hypophosphatemia Continue to monitor and replete as needed  ?  Mild dementia/cognitive issues B12/RPR all stable Ammonia level is only minimally elevated-doubt of any clinical significance. No confusion this morning-continue to maintain delirium precautions  Elevated TSH TSH 5.8 with T4 1.04.  Likely sick euthyroid and will need repeat TSH in 4 to 6 weeks  Palliative care Previously had long discussion with the patient/daughter regarding findings on MRI that could represent metastatic disease.  Underwent liver biopsy 11/17 and awaiting results but if metastatic disease they seem to be leaning towards stopping further workup but will  continue to discuss  Code status:   Code Status: Full Code   DVT Prophylaxis: Place TED hose Start: 10/19/24 0932 enoxaparin  (LOVENOX ) injection 30 mg Start: 10/18/24 1000 Place and maintain sequential compression device Start: 10/16/24 0843   Family Communication: None at bedside  Disposition Plan:  The patient will require care spanning > 2 midnights and should be moved to inpatient because: Severity of illness   Planned Discharge Destination:Home   Diet: Diet Order             DIET SOFT Room service appropriate? Yes; Fluid consistency: Thin  Diet effective now                    MEDICATIONS: Scheduled Meds:  amLODipine   10 mg Oral Daily   enoxaparin  (LOVENOX ) injection  30 mg Subcutaneous Daily   feeding supplement  237 mL Oral BID BM   ferrous sulfate   325 mg Oral Q breakfast   folic acid   1 mg Oral Daily   metoprolol  tartrate  50 mg Oral BID   multivitamin with minerals  1 tablet Oral Daily   pantoprazole   40 mg Oral BID   sodium chloride  flush  5 mL Intracatheter Q8H   Continuous Infusions:   PRN Meds:.acetaminophen , albuterol , diltiazem , fentaNYL  (SUBLIMAZE ) injection, hydrALAZINE , LORazepam , ondansetron  **OR** ondansetron  (ZOFRAN ) IV, sodium chloride  flush   I have personally reviewed following labs and imaging studies  LABORATORY DATA:   Data Review:   Inpatient Medications  Scheduled Meds:  amLODipine   10 mg Oral Daily   enoxaparin  (LOVENOX ) injection  30 mg Subcutaneous Daily   feeding supplement  237 mL Oral BID BM   ferrous sulfate   325 mg Oral Q breakfast  folic acid   1 mg Oral Daily   metoprolol  tartrate  50 mg Oral BID   multivitamin with minerals  1 tablet Oral Daily   pantoprazole   40 mg Oral BID   sodium chloride  flush  5 mL Intracatheter Q8H   Continuous Infusions:   PRN Meds:.acetaminophen , albuterol , diltiazem , fentaNYL  (SUBLIMAZE ) injection, hydrALAZINE , LORazepam , ondansetron  **OR** ondansetron  (ZOFRAN ) IV, sodium  chloride flush  DVT Prophylaxis  Place TED hose Start: 10/19/24 0932 enoxaparin  (LOVENOX ) injection 30 mg Start: 10/18/24 1000 Place and maintain sequential compression device Start: 10/16/24 0843   Recent Labs  Lab 10/22/24 0320 10/24/24 1123 10/25/24 0020 10/26/24 0245 10/27/24 0333  WBC 12.9* 11.8* 9.3 6.7 6.4  HGB 9.0* 11.8* 9.4* 9.6* 9.5*  HCT 27.8* 35.9* 29.4* 29.4* 29.2*  PLT 234 297 267 307 373  MCV 83.0 81.2 83.3 82.4 83.2  MCH 26.9 26.7 26.6 26.9 27.1  MCHC 32.4 32.9 32.0 32.7 32.5  RDW 17.2* 17.2* 17.4* 17.3* 17.9*  LYMPHSABS 0.7 0.6* 0.6* 0.7 1.1  MONOABS 0.9 0.5 0.6 0.7 0.7  EOSABS 0.1 0.0 0.0 0.0 0.0  BASOSABS 0.0 0.0 0.0 0.0 0.1    Recent Labs  Lab 10/22/24 0320 10/24/24 1123 10/24/24 1223 10/25/24 0020 10/26/24 0245 10/27/24 0333  NA 140 136  --  136 138 139  K 3.6 3.3*  --  3.3* 4.2 4.1  CL 106 100  --  103 105 108  CO2 17* 19*  --  20* 23 23  ANIONGAP 17* 17*  --  13 10 8   GLUCOSE 82 88  --  103* 95 88  BUN 7* 7*  --  6* <5* 5*  CREATININE 1.53* 1.24*  --  1.23* 1.24* 1.36*  AST 24 37  --  32 31 35  ALT 24 27  --  22 22 20   ALKPHOS 57 71  --  54 54 50  BILITOT 1.5* 1.2  --  1.1 0.9 0.6  ALBUMIN 2.0* 2.5*  --  2.0* 2.0* 2.1*  CRP  --  15.7*  --  11.4* 8.9*  --   PROCALCITON 0.47 0.19  --  0.34 0.19 0.15  INR  --   --  1.2 1.2  --   --   MG  --  1.7  --  2.2 1.9  --   CALCIUM  9.5 9.3  --  8.9 9.3 9.1      Recent Labs  Lab 10/22/24 0320 10/24/24 1123 10/24/24 1223 10/25/24 0020 10/26/24 0245 10/27/24 0333  CRP  --  15.7*  --  11.4* 8.9*  --   PROCALCITON 0.47 0.19  --  0.34 0.19 0.15  INR  --   --  1.2 1.2  --   --   MG  --  1.7  --  2.2 1.9  --   CALCIUM  9.5 9.3  --  8.9 9.3 9.1    --------------------------------------------------------------------------------------------------------------- Lab Results  Component Value Date   CHOL 157 09/01/2023   HDL 86 09/01/2023   LDLCALC 57 09/01/2023   TRIG 74 09/01/2023   CHOLHDL  1.8 09/01/2023    Lab Results  Component Value Date   HGBA1C 6.2 10/02/2022   Radiology Reports  No results found.    Signature  -    Brayton Lye M.D on 10/28/2024 at 12:54 PM   -  To page go to [email or web address]

## 2024-10-28 NOTE — Progress Notes (Addendum)
   10/28/24 1036  Mobility  Activity Ambulated with assistance  Level of Assistance Standby assist, set-up cues, supervision of patient - no hands on  Assistive Device Four wheel walker  Distance Ambulated (ft) 550 ft  Activity Response Tolerated well  Mobility Referral Yes  Mobility visit 1 Mobility  Mobility Specialist Start Time (ACUTE ONLY) 1036  Mobility Specialist Stop Time (ACUTE ONLY) 1045  Mobility Specialist Time Calculation (min) (ACUTE ONLY) 9 min   Mobility Specialist: Progress Note  Post-Mobility:    HR 64, SpO2 97% RA  Pt agreeable to mobility session - received in standing in room with NT present. Pt was asymptomatic throughout session with no complaints. Returned to chair with all needs met - call bell within reach. Chair alarm on.   Additional comments: Pt seen for additional visit. Results same as before.  Virgle Boards, BS Mobility Specialist Please contact via SecureChat or  Rehab office at [phone].

## 2024-10-28 NOTE — Progress Notes (Signed)
 Physical Therapy Treatment Patient Details Name: Haley Randall MRN: [id number] DOB: [birth year] Today's Date: 10/28/2024   History of Present Illness 88 y.o. female adm 10/13/24 with medical history significant of hypertension, hyperlipidemia, and gout presents with abdominal cramps.  Admitted for abdominal cramps.    PT Comments  Pt tolerated treatment well today. Pt with similar presentation to previous session. Able to ambulate in hallway with rollator at supervision level. Still requires cues for rollator brake management. No change in DC/DME recs at this time. PT will continue to follow.     If plan is discharge home, recommend the following: A little help with walking and/or transfers;A little help with bathing/dressing/bathroom;Assistance with cooking/housework;Help with stairs or ramp for entrance;Assist for transportation   Can travel by private Psychologist, Clinical (4 wheels)    Recommendations for Other Services       Precautions / Restrictions Precautions Precautions: Fall Recall of Precautions/Restrictions: Intact Restrictions Weight Bearing Restrictions Per Provider Order: No     Mobility  Bed Mobility               General bed mobility comments: Up in recliner    Transfers Overall transfer level: Modified independent Equipment used: Rollator (4 wheels) Transfers: Sit to/from Stand Sit to Stand: Modified independent (Device/Increase time)                Ambulation/Gait Ambulation/Gait assistance: Supervision Gait Distance (Feet): 600 Feet Assistive device: Rollator (4 wheels) Gait Pattern/deviations: Narrow base of support, Scissoring, Decreased stride length, Step-through pattern Gait velocity: decreased     General Gait Details: Pt required education on rollator brake management again. Overall steady with improved gait speed compared to previous session.   Stairs             Wheelchair Mobility      Tilt Bed    Modified Rankin (Stroke Patients Only)       Balance Overall balance assessment: Needs assistance Sitting-balance support: Feet supported Sitting balance-Leahy Scale: Normal     Standing balance support: Reliant on assistive device for balance Standing balance-Leahy Scale: Fair                              Hotel Manager: No apparent difficulties  Cognition Arousal: Alert Behavior During Therapy: WFL for tasks assessed/performed   PT - Cognitive impairments: No apparent impairments                         Following commands: Intact      Cueing Cueing Techniques: Verbal cues  Exercises      General Comments General comments (skin integrity, edema, etc.): VSS. Lots of family present      Pertinent Vitals/Pain Pain Assessment Pain Assessment: No/denies pain    Home Living                          Prior Function            PT Goals (current goals can now be found in the care plan section) Progress towards PT goals: Progressing toward goals    Frequency    Min 2X/week      PT Plan      Co-evaluation              AM-PAC PT 6 Clicks Mobility   Outcome Measure  Help needed turning from your back to your side while in a flat bed without using bedrails?: None Help needed moving from lying on your back to sitting on the side of a flat bed without using bedrails?: None Help needed moving to and from a bed to a chair (including a wheelchair)?: A Little Help needed standing up from a chair using your arms (e.g., wheelchair or bedside chair)?: A Little Help needed to walk in hospital room?: A Little Help needed climbing 3-5 steps with a railing? : A Little 6 Click Score: 20    End of Session Equipment Utilized During Treatment: Gait belt Activity Tolerance: Patient tolerated treatment well Patient left: in chair;with call bell/phone within reach;with chair alarm set Nurse  Communication: Mobility status PT Visit Diagnosis: Other abnormalities of gait and mobility (R26.89)     Time: 8543-8487 PT Time Calculation (min) (ACUTE ONLY): 16 min  Charges:    $Gait Training: 8-22 mins PT General Charges $$ ACUTE PT VISIT: 1 Visit                     Haley Randall, PT, DPT Acute Rehab Services [phone]    Haley Randall 10/28/2024, 3:41 PM

## 2024-10-29 DIAGNOSIS — K859 Acute pancreatitis without necrosis or infection, unspecified: Secondary | ICD-10-CM | POA: Diagnosis not present

## 2024-10-29 LAB — PHOSPHORUS: Phosphorus: 2.5 mg/dL (ref 2.5–4.6)

## 2024-10-29 LAB — COMPREHENSIVE METABOLIC PANEL WITH GFR
ALT: 23 U/L (ref 0–44)
AST: 38 U/L (ref 15–41)
Albumin: 2.2 g/dL — ABNORMAL LOW (ref 3.5–5.0)
Alkaline Phosphatase: 51 U/L (ref 38–126)
Anion gap: 12 (ref 5–15)
BUN: <5 mg/dL — ABNORMAL LOW (ref 8–23)
CO2: 20 mmol/L — ABNORMAL LOW (ref 22–32)
Calcium: 9.1 mg/dL (ref 8.9–10.3)
Chloride: 108 mmol/L (ref 98–111)
Creatinine, Ser: 1.4 mg/dL — ABNORMAL HIGH (ref 0.44–1.00)
GFR, Estimated: 37 mL/min — ABNORMAL LOW (ref >60)
Glucose, Bld: 80 mg/dL (ref 70–99)
Potassium: 3.6 mmol/L (ref 3.5–5.1)
Sodium: 140 mmol/L (ref 135–145)
Total Bilirubin: 0.9 mg/dL (ref 0.0–1.2)
Total Protein: 5.4 g/dL — ABNORMAL LOW (ref 6.5–8.1)

## 2024-10-29 LAB — BODY FLUID CULTURE W GRAM STAIN
Culture: NO GROWTH
Gram Stain: NONE SEEN

## 2024-10-29 NOTE — Plan of Care (Signed)

## 2024-10-29 NOTE — Progress Notes (Signed)
 PROGRESS NOTE        PATIENT DETAILS Name: Haley Randall Age: 88 y.o. Sex: female Date of Birth: [birth year] Admit Date: 10/13/2024 Admitting Physician [name], MD ERE:Ilhjo, Ginger, FNP  Brief Summary: Patient is a 88 y.o.  female with history of HTN, HLD-who presented with abdominal pain-found to have pancreatitis/vague liver lesions on CT abdomen.  Significant events: 11/13>> admit to [facility]  Significant studies: 11/13>> CXR: No PNA 11/13>> CT abdomen/pelvis: 4.8 x 3.1 cm subtle lesion-right liver-additional small lesions measuring up to 2.1 cm.  Gallbladder distended.  Edema/inflammation along the descending ascending colon/hepatic flexure. 11/13>> RUQ ultrasound: Gallbladder wall upper normal to borderline thickened, no echogenic gallstones.  CBD 8 mm. 11/14>> MRI abdomen: Multicystic enhancing lesions in the liver-abscesses versus metastatic disease 10/17/2024.  CT-guided liver biopsy by IR   10/22/24 - repeat MRCP -  1. Progressive complex fluid accumulation within the right hemiabdomen with early loculation. The complex fluid shows increased T1 signal, which may be seen with underlying concentrated bile duct, blood products or proteinaceous material. A nuclear medicine HIDA scan may be helpful to exclude bile leak. 2. New gallbladder wall edema with pericholecystic fluid. No gallstones identified. 3. Persistent area of hyperemia surrounding multiple cystic areas in the periphery of segment 7 and dome of liver which may re represent multiple dilated biliary radicles in the setting of cholangitis. No discrete drainable abscess or mass noted. 4. Interval increase in volume of right pleural effusion, now moderate in volume, with overlying atelectasis or consolidation. 11/24>> HIDA scan nonacute 11/25>> right upper quadrant drain placement by IR    Significant microbiology data:  11/13>> COVID/influenza/RSV PCR: Negative 11/13>> urine culture:  Multiple species 11/13>> 1/2: Gram-positive cocci (prelim Staph epidermidis)-likely contamination. 11/14>> acute hepatitis serology: Negative 11/14>> RPR: Nonreactive  Consults:  General surgery IR [facility]  Subjective:   No dyspnea, no chest pain, no shortness of breath, no abdominal pain, no nausea or vomiting  Objective: Vitals: Blood pressure (!) 138/35, pulse 61, temperature 97.7 F (36.5 C), temperature source Oral, resp. rate 14, height 4' 11 (1.499 m), weight 58.2 kg, SpO2 99%.   Exam:  Awake Alert, Oriented X 3, No new F.N deficits, Normal affect Improved entry in right lung base, no wheezing Regular rate and rhythm +ve B.Sounds, Abd Soft, No tenderness, upper quadrant drain present with strength venous output No Cyanosis, Clubbing or edema, No new Rash or bruise    Assessment/Plan:  acute pancreatitis  epigastric abdominal pain  asymptomatic transaminitis and mildly elevated bilirubin levels Multicystic enhancing liver lesion.   Possible colitis. -No nausea, vomiting or abdominal pain, acute pancreatitis resolved. - No gallstones on imaging, including unremarkable HIDA scan, unremarkable MRCP with no evidence of choledocholithiasis, no major pancreatic abnormality on MRI. - Liver biopsy by IR, no malignant cells insufficient for culture or sensitivity -Right abdomen fluid on repeat imaging 11/25, status post drain placement by IR, negative cytology, drain management by IR - Received 12 days of IV Zosyn , so far cultures are negative, so stop antibiotic 11/27 - Amylase and bilirubin from fluid collection from right upper quadrant. - GI and general surgery consulted  Right pleural effusion - Moderate size on recent imaging, s/p thoracentesis 11/28, 350 cc. -Workup significant for transudate, for Gram stain and cultures are negative. - Most likely sympathetic pleural effusion due to right upper quadrant process -  Cytology still  pending   Gram-positive bacteremia-coag negative staph (preliminary result)  likely a contaminant, 1 out of 2 blood cultures, other one showed no growth for completion  HTN Systolic blood pressures remain elevated but diastolics remain low which has been a problem in the past No return of tachycardia after starting metoprolol  Hold off on resuming amlodipine  Trial TED stockings, issues with orthostatic hypotension in the past  NAGMA Prerenal AKI on CKD stage IIIa/b Bicarb continues to fluctuate between 16 and 19 Likely 2/2 renal dysfunction, which has been stable at baseline over the past few days after initial AKI Continue to monitor   Normocytic anemia No recent baseline but hemoglobin had been normal over 1 year ago Hemoglobin 11 on admission and dropped to 7.3 on 11/19, s/p 1 unit PRBCs on 11/19 Hemoglobin stable at 9.6 this morning Iron labs most consistent with anemia of chronic disease Continue ferrous sulfate   Hypomagnesemia, hypokalemia, hypophosphatemia Continue to monitor and replete as needed  ?  Mild dementia/cognitive issues B12/RPR all stable Ammonia level is only minimally elevated-doubt of any clinical significance. No confusion this morning-continue to maintain delirium precautions  Elevated TSH TSH 5.8 with T4 1.04.  Likely sick euthyroid and will need repeat TSH in 4 to 6 weeks  Palliative care Previously had long discussion with the patient/daughter regarding findings on MRI that could represent metastatic disease.  Underwent liver biopsy 11/17 and awaiting results but if metastatic disease they seem to be leaning towards stopping further workup but will continue to discuss  Code status:   Code Status: Full Code   DVT Prophylaxis: Place TED hose Start: 10/19/24 0932 enoxaparin  (LOVENOX ) injection 30 mg Start: 10/18/24 1000 Place and maintain sequential compression device Start: 10/16/24 0843   Family Communication: None at bedside  Disposition  Plan:  The patient will require care spanning > 2 midnights and should be moved to inpatient because: Severity of illness   Planned Discharge Destination:Home   Diet: Diet Order             DIET SOFT Room service appropriate? Yes; Fluid consistency: Thin  Diet effective now                    MEDICATIONS: Scheduled Meds:  amLODipine   10 mg Oral Daily   enoxaparin  (LOVENOX ) injection  30 mg Subcutaneous Daily   ferrous sulfate   325 mg Oral Q breakfast   folic acid   1 mg Oral Daily   metoprolol  tartrate  50 mg Oral BID   multivitamin with minerals  1 tablet Oral Daily   pantoprazole   40 mg Oral BID   sodium chloride  flush  5 mL Intracatheter Q8H   Continuous Infusions:   PRN Meds:.acetaminophen , albuterol , diltiazem , hydrALAZINE , LORazepam , ondansetron  **OR** ondansetron  (ZOFRAN ) IV, sodium chloride  flush   I have personally reviewed following labs and imaging studies  LABORATORY DATA:   Data Review:   Inpatient Medications  Scheduled Meds:  amLODipine   10 mg Oral Daily   enoxaparin  (LOVENOX ) injection  30 mg Subcutaneous Daily   ferrous sulfate   325 mg Oral Q breakfast   folic acid   1 mg Oral Daily   metoprolol  tartrate  50 mg Oral BID   multivitamin with minerals  1 tablet Oral Daily   pantoprazole   40 mg Oral BID   sodium chloride  flush  5 mL Intracatheter Q8H   Continuous Infusions:   PRN Meds:.acetaminophen , albuterol , diltiazem , hydrALAZINE , LORazepam , ondansetron  **OR** ondansetron  (ZOFRAN ) IV, sodium chloride  flush  DVT Prophylaxis  Place TED hose Start: 10/19/24 0932 enoxaparin  (LOVENOX ) injection 30 mg Start: 10/18/24 1000 Place and maintain sequential compression device Start: 10/16/24 0843   Recent Labs  Lab 10/24/24 1123 10/25/24 0020 10/26/24 0245 10/27/24 0333  WBC 11.8* 9.3 6.7 6.4  HGB 11.8* 9.4* 9.6* 9.5*  HCT 35.9* 29.4* 29.4* 29.2*  PLT 297 267 307 373  MCV 81.2 83.3 82.4 83.2  MCH 26.7 26.6 26.9 27.1  MCHC 32.9 32.0  32.7 32.5  RDW 17.2* 17.4* 17.3* 17.9*  LYMPHSABS 0.6* 0.6* 0.7 1.1  MONOABS 0.5 0.6 0.7 0.7  EOSABS 0.0 0.0 0.0 0.0  BASOSABS 0.0 0.0 0.0 0.1    Recent Labs  Lab 10/24/24 1123 10/24/24 1223 10/25/24 0020 10/26/24 0245 10/27/24 0333 10/29/24 0501  NA 136  --  136 138 139 140  K 3.3*  --  3.3* 4.2 4.1 3.6  CL 100  --  103 105 108 108  CO2 19*  --  20* 23 23 20*  ANIONGAP 17*  --  13 10 8 12   GLUCOSE 88  --  103* 95 88 80  BUN 7*  --  6* <5* 5* <5*  CREATININE 1.24*  --  1.23* 1.24* 1.36* 1.40*  AST 37  --  32 31 35 38  ALT 27  --  22 22 20 23   ALKPHOS 71  --  54 54 50 51  BILITOT 1.2  --  1.1 0.9 0.6 0.9  ALBUMIN 2.5*  --  2.0* 2.0* 2.1* 2.2*  CRP 15.7*  --  11.4* 8.9*  --   --   PROCALCITON 0.19  --  0.34 0.19 0.15  --   INR  --  1.2 1.2  --   --   --   MG 1.7  --  2.2 1.9  --   --   PHOS  --   --   --   --   --  2.5  CALCIUM  9.3  --  8.9 9.3 9.1 9.1      Recent Labs  Lab 10/24/24 1123 10/24/24 1223 10/25/24 0020 10/26/24 0245 10/27/24 0333 10/29/24 0501  CRP 15.7*  --  11.4* 8.9*  --   --   PROCALCITON 0.19  --  0.34 0.19 0.15  --   INR  --  1.2 1.2  --   --   --   MG 1.7  --  2.2 1.9  --   --   CALCIUM  9.3  --  8.9 9.3 9.1 9.1    --------------------------------------------------------------------------------------------------------------- Lab Results  Component Value Date   CHOL 157 09/01/2023   HDL 86 09/01/2023   LDLCALC 57 09/01/2023   TRIG 74 09/01/2023   CHOLHDL 1.8 09/01/2023    Lab Results  Component Value Date   HGBA1C 6.2 10/02/2022   Radiology Reports  DG Chest 1 View Result Date: 10/28/2024 EXAM: 1 VIEW XRAY OF THE CHEST 10/28/2024 01:08:00 PM COMPARISON: 10/13/2024 CLINICAL HISTORY: S/P thoracentesis FINDINGS: LUNGS AND PLEURA: No focal pulmonary opacity. No pleural effusion. No pneumothorax. HEART AND MEDIASTINUM: Atherosclerotic calcification of aortic arch. BONES AND SOFT TISSUES: No acute osseous abnormality. IMPRESSION: 1. No  acute process. Electronically signed by: Donnice Mania MD 10/28/2024 01:25 PM EST RP Workstation: [id number]      Signature  -    Brayton Lye M.D on 10/29/2024 at 12:40 PM   -  To page go to [email or web address]

## 2024-10-30 DIAGNOSIS — K859 Acute pancreatitis without necrosis or infection, unspecified: Secondary | ICD-10-CM | POA: Diagnosis not present

## 2024-10-30 LAB — AEROBIC/ANAEROBIC CULTURE W GRAM STAIN (SURGICAL/DEEP WOUND)
Culture: NO GROWTH
Gram Stain: NONE SEEN

## 2024-10-30 LAB — TRIGLYCERIDES, BODY FLUIDS: Triglycerides, Fluid: 25 mg/dL

## 2024-10-30 LAB — COMPREHENSIVE METABOLIC PANEL WITH GFR
ALT: 27 U/L (ref 0–44)
AST: 49 U/L — ABNORMAL HIGH (ref 15–41)
Albumin: 2.4 g/dL — ABNORMAL LOW (ref 3.5–5.0)
Alkaline Phosphatase: 56 U/L (ref 38–126)
Anion gap: 12 (ref 5–15)
BUN: 6 mg/dL — ABNORMAL LOW (ref 8–23)
CO2: 21 mmol/L — ABNORMAL LOW (ref 22–32)
Calcium: 9.2 mg/dL (ref 8.9–10.3)
Chloride: 108 mmol/L (ref 98–111)
Creatinine, Ser: 1.2 mg/dL — ABNORMAL HIGH (ref 0.44–1.00)
GFR, Estimated: 44 mL/min — ABNORMAL LOW (ref >60)
Glucose, Bld: 84 mg/dL (ref 70–99)
Potassium: 3.7 mmol/L (ref 3.5–5.1)
Sodium: 141 mmol/L (ref 135–145)
Total Bilirubin: 0.8 mg/dL (ref 0.0–1.2)
Total Protein: 5.8 g/dL — ABNORMAL LOW (ref 6.5–8.1)

## 2024-10-30 LAB — MISC LABCORP TEST (SEND OUT): Labcorp test code: 88062

## 2024-10-30 MED ORDER — METOPROLOL TARTRATE 25 MG PO TABS
25.0000 mg | ORAL_TABLET | Freq: Two times a day (BID) | ORAL | Status: DC
  Administered 2024-10-30 – 2024-10-31 (×2): 25 mg via ORAL
  Filled 2024-10-30 (×2): qty 1

## 2024-10-30 NOTE — Progress Notes (Signed)
 Subjective/Chief Complaint: Feels well this morning.  Denies any abdominal pain, nausea, or other complaint   Objective: Vital signs in last 24 hours: Temp:  [97.5 F (36.4 C)-98.2 F (36.8 C)] 97.5 F (36.4 C) (11/30 0800) Pulse Rate:  [56-71] 56 (11/30 0800) Resp:  [15-21] 15 (11/30 0800) BP: (133-188)/(32-68) 149/45 (11/30 0800) SpO2:  [95 %-97 %] 96 % (11/30 0800) Last BM Date : 10/29/24  Intake/Output from previous day: 11/29 0701 - 11/30 0700 In: 10  Out: 30 [Drains:30] Intake/Output this shift: No intake/output data recorded.  Alert, well-appearing Unlabored respirations Abdomen soft, nontender, nondistended Drain with thin, brown output about 30 cc last 24 hours, output downtrending   Lab Results:  No results for input(s): WBC, HGB, HCT, PLT in the last 72 hours. BMET Recent Labs    10/29/24 0501 10/30/24 0343  NA 140 141  K 3.6 3.7  CL 108 108  CO2 20* 21*  GLUCOSE 80 84  BUN <5* 6*  CREATININE 1.40* 1.20*  CALCIUM  9.1 9.2   PT/INR No results for input(s): LABPROT, INR in the last 72 hours. ABG No results for input(s): PHART, HCO3 in the last 72 hours.  Invalid input(s): PCO2, PO2  Studies/Results: DG Chest 1 View Result Date: 10/28/2024 EXAM: 1 VIEW XRAY OF THE CHEST 10/28/2024 01:08:00 PM COMPARISON: 10/13/2024 CLINICAL HISTORY: S/P thoracentesis FINDINGS: LUNGS AND PLEURA: No focal pulmonary opacity. No pleural effusion. No pneumothorax. HEART AND MEDIASTINUM: Atherosclerotic calcification of aortic arch. BONES AND SOFT TISSUES: No acute osseous abnormality. IMPRESSION: 1. No acute process. Electronically signed by: Donnice Mania MD 10/28/2024 01:25 PM EST RP Workstation: [id number]    Anti-infectives: Anti-infectives (From admission, onward)    Start     Dose/Rate Route Frequency Ordered Stop   10/15/24 2200  piperacillin -tazobactam (ZOSYN ) IVPB 3.375 g  Status:  Discontinued        3.375 g 12.5 mL/hr over 240  Minutes Intravenous Every 8 hours 10/15/24 1542 10/27/24 0721   10/14/24 1400  piperacillin -tazobactam (ZOSYN ) IVPB 2.25 g  Status:  Discontinued        2.25 g 100 mL/hr over 30 Minutes Intravenous Every 8 hours 10/14/24 1024 10/15/24 1542   10/13/24 1400  piperacillin -tazobactam (ZOSYN ) IVPB 3.375 g  Status:  Discontinued        3.375 g 100 mL/hr over 30 Minutes Intravenous Every 8 hours 10/13/24 1036 10/13/24 1038   10/13/24 1400  piperacillin -tazobactam (ZOSYN ) IVPB 3.375 g  Status:  Discontinued        3.375 g 12.5 mL/hr over 240 Minutes Intravenous Every 8 hours 10/13/24 1039 10/14/24 1024   10/13/24 1045  piperacillin -tazobactam (ZOSYN ) IVPB 3.375 g  Status:  Discontinued        3.375 g 12.5 mL/hr over 240 Minutes Intravenous Every 8 hours 10/13/24 1038 10/13/24 1039   10/13/24 0530  piperacillin -tazobactam (ZOSYN ) IVPB 3.375 g        3.375 g 100 mL/hr over 30 Minutes Intravenous  Once 10/13/24 0525 10/13/24 9362       Assessment/Plan:  Expand All Collapse All  [facility] Progress Note      Subjective: CC:  Cc is loose, brown stools every time she goes to the bathroom to urinate. Denies abdominal pain at present. Reports intermittent abdominal pain, described as cramping, that moves around but is usually on her right side. Tolerating PO but reports poor oral intake, decreased appetite, and early satiety.    Objective: Vital signs in last 24 hours: Temp:  [97.1  F (36.2 C)-97.9 F (36.6 C)] 97.4 F (36.3 C) (11/25 0800) Pulse Rate:  [55-82] 67 (11/25 0800) Resp:  [12-20] 20 (11/25 0800) BP: (130-162)/(43-55) 148/43 (11/25 0800) SpO2:  [93 %-96 %] 96 % (11/25 0800) Last BM Date : 10/25/24   Intake/Output from previous day: No intake/output data recorded. Intake/Output this shift: No intake/output data recorded.   PE: Gen:  Alert, NAD, pleasant Card:  Regular rate and rhythm Pulm:  Normal effort ORA  Abd: Soft, non-tender, no significant  distention, no HSM, no palpable hernias or masses Skin: warm and dry, no rashes  Psych: A&Ox3    Lab Results:  Recent Labs (last 2 labs)      Recent Labs    10/24/24 1123 10/25/24 0020  WBC 11.8* 9.3  HGB 11.8* 9.4*  HCT 35.9* 29.4*  PLT 297 267      BMET Recent Labs (last 2 labs)      Recent Labs    10/24/24 1123 10/25/24 0020  NA 136 136  K 3.3* 3.3*  CL 100 103  CO2 19* 20*  GLUCOSE 88 103*  BUN 7* 6*  CREATININE 1.24* 1.23*  CALCIUM  9.3 8.9      PT/INR Recent Labs (last 2 labs)      Recent Labs    10/24/24 1223 10/25/24 0020  LABPROT 15.6* 15.7*  INR 1.2 1.2      CMP     Labs (Brief)          Component Value Date/Time    NA 136 10/25/2024 0020    NA 143 09/07/2023 1005    K 3.3 (L) 10/25/2024 0020    CL 103 10/25/2024 0020    CO2 20 (L) 10/25/2024 0020    GLUCOSE 103 (H) 10/25/2024 0020    BUN 6 (L) 10/25/2024 0020    BUN 15 09/07/2023 1005    CREATININE 1.23 (H) 10/25/2024 0020    CALCIUM  8.9 10/25/2024 0020    PROT 5.3 (L) 10/25/2024 0020    PROT 7.0 09/01/2023 0927    ALBUMIN 2.0 (L) 10/25/2024 0020    ALBUMIN 4.4 09/01/2023 0927    AST 32 10/25/2024 0020    ALT 22 10/25/2024 0020    ALKPHOS 54 10/25/2024 0020    BILITOT 1.1 10/25/2024 0020    BILITOT 0.8 09/01/2023 0927    GFRNONAA 43 (L) 10/25/2024 0020      Lipase  Labs (Brief)          Component Value Date/Time    LIPASE >2,800 (H) 10/13/2024 0517              Studies/Results:  Imaging Results (Last 48 hours)  NM Hepatobiliary Liver Func Result Date: 10/24/2024 EXAM: NM HEPATOBILLARY SCAN 10/24/2024 05:35:58 PM TECHNIQUE: RADIOPHARMACEUTICAL: 5.5 mCi Tc-30m mebrofenin  Anterior planar images of the abdomen and pelvis were obtained over approximately 1 hour. 2.3 mg Morphine  given intravenously  Dynamic imaging of the abdomen is obtained COMPARISON: MRI 10/22/2024, ultrasound 10/13/2024 CLINICAL HISTORY: Biliary colic, recurrent, gallbladder dyskinesia suspected  FINDINGS: Homogenous uptake within the liver. Normal clearance of the blood pool. Appropriate excretion into the biliary system. Activity is visualized in the small bowel. Activity is visualized in the gallbladder following morphine  administration IMPRESSION: 1. Negative for acute cholecystitis ( gallbladder visualized after morphine  administration ) 2. Normal biliary to bowel transit. Electronically signed by: Luke Bun MD 10/24/2024 06:54 PM EST RP Workstation: [id number]       Anti-infectives: Anti-infectives (From admission, onward)  Start     Dose/Rate Route Frequency Ordered Stop    10/15/24 2200   piperacillin -tazobactam (ZOSYN ) IVPB 3.375 g        3.375 g 12.5 mL/hr over 240 Minutes Intravenous Every 8 hours 10/15/24 1542      10/14/24 1400   piperacillin -tazobactam (ZOSYN ) IVPB 2.25 g  Status:  Discontinued        2.25 g 100 mL/hr over 30 Minutes Intravenous Every 8 hours 10/14/24 1024 10/15/24 1542    10/13/24 1400   piperacillin -tazobactam (ZOSYN ) IVPB 3.375 g  Status:  Discontinued        3.375 g 100 mL/hr over 30 Minutes Intravenous Every 8 hours 10/13/24 1036 10/13/24 1038    10/13/24 1400   piperacillin -tazobactam (ZOSYN ) IVPB 3.375 g  Status:  Discontinued        3.375 g 12.5 mL/hr over 240 Minutes Intravenous Every 8 hours 10/13/24 1039 10/14/24 1024    10/13/24 1045   piperacillin -tazobactam (ZOSYN ) IVPB 3.375 g  Status:  Discontinued        3.375 g 12.5 mL/hr over 240 Minutes Intravenous Every 8 hours 10/13/24 1038 10/13/24 1039    10/13/24 0530   piperacillin -tazobactam (ZOSYN ) IVPB 3.375 g        3.375 g 100 mL/hr over 30 Minutes Intravenous  Once 10/13/24 0525 10/13/24 9362               Assessment/Plan Epigastric abdominal pain Acute pancreatitis (?) Possible liver masses/abscesses noted on CT  - CT 11/13 with subtle lesion in right liver with additional small lesions toward dome of liver, distended gallbladder with pericholecystic fluid and  questionable gallbladder wall thickening, no gallstones, inflammation/edema along the distal ascending colon and hepatic flexure without colonic wall thickening , small volume perihepatic free fluid with questionable periportal edema  - RUQ US  11/13 with gallbladder wall borderline thickening, no gallstones, no sonographic murphy sign, pericholecystic fluid present, CBD dilated to 8 mm, liver heterogeneous and echogenic, liver lesions not readily evident  - 11/15 MRCP with multicystic liver lesions, RUQ edema, no cholelithiasis, no biliary dilation, no overt pancreatic edema - 11/17 CT-guided liver biopsy by IR (nonspecific inflammation and reactive changes, no malignancy) - 11/22 MR liver with interval/progressing fluid collection in the right hemiabdomen, gallbladder wall edema, No gallstones, multiple cystic areas in segment 7/dome of liver, possible dilated biliary radicles, right pleural effusion.  - 11/24 HIDA negative for bile leak/cholecystitis  - IR consulted and recommended CT scan of the abdomen and pelvis to more fully evaluate intra-abdominal fluid.  - increasing, right-sided retroperitoneal (?) fluid of unclear etiology. Differential includes ascites from pancreatitis, bile leak that was not apparent on HIDA, hematoma from liver biopsy above, sequela of right-sided colitis (felt less likely).   -Drain placed 11/25 within the will output of thin, dark sanguinous fluid, has been low output and continue decreasing volume of thin brown fluid.  Cytology without malignant cells.  Looks like bilirubin was never performed, amylase 334 (slightly elevated) and cultures negative  -R Pleural effusion status postthoracentesis 11/28.  Cytology pending.  At this point, no role for surgical intervention.  Suspect this fluid collection just getting is either sequela of pancreatitis or more likely prior liver biopsy procedure.  Surgery team will follow peripherally.  - per [facility] -  HTN HLD CKD stage  IIIb     I reviewed nursing notes, Consultant IR notes, hospitalist notes, last 24 h vitals and pain scores, last 48 h intake and output, last 24 h  labs and trends, and last 24 h imaging results.   This care required moderate level of medical decision making.        LOS: 16 days    Mitzie DELENA Freund 10/30/2024

## 2024-10-30 NOTE — Progress Notes (Addendum)
 PROGRESS NOTE        PATIENT DETAILS Name: Haley Randall Age: 88 y.o. Sex: female Date of Birth: May 26, 1936 Admit Date: 10/13/2024 Admitting Physician [name], MD ERE:Ilhjo, Ginger, FNP  Brief Summary: Patient is a 88 y.o.  female with history of HTN, HLD-who presented with abdominal pain-found to have pancreatitis/vague liver lesions on CT abdomen.  Significant events: 11/13>> admit to [facility]  Significant studies: 11/13>> CXR: No PNA 11/13>> CT abdomen/pelvis: 4.8 x 3.1 cm subtle lesion-right liver-additional small lesions measuring up to 2.1 cm.  Gallbladder distended.  Edema/inflammation along the descending ascending colon/hepatic flexure. 11/13>> RUQ ultrasound: Gallbladder wall upper normal to borderline thickened, no echogenic gallstones.  CBD 8 mm. 11/14>> MRI abdomen: Multicystic enhancing lesions in the liver-abscesses versus metastatic disease 10/17/2024.  CT-guided liver biopsy by IR   10/22/24 - repeat MRCP -  1. Progressive complex fluid accumulation within the right hemiabdomen with early loculation. The complex fluid shows increased T1 signal, which may be seen with underlying concentrated bile duct, blood products or proteinaceous material. A nuclear medicine HIDA scan may be helpful to exclude bile leak. 2. New gallbladder wall edema with pericholecystic fluid. No gallstones identified. 3. Persistent area of hyperemia surrounding multiple cystic areas in the periphery of segment 7 and dome of liver which may re represent multiple dilated biliary radicles in the setting of cholangitis. No discrete drainable abscess or mass noted. 4. Interval increase in volume of right pleural effusion, now moderate in volume, with overlying atelectasis or consolidation. 11/24>> HIDA scan nonacute 11/25>> right upper quadrant drain placement by IR    Significant microbiology data:  11/13>> COVID/influenza/RSV PCR: Negative 11/13>> urine culture:  Multiple species 11/13>> 1/2: Gram-positive cocci (prelim Staph epidermidis)-likely contamination. 11/14>> acute hepatitis serology: Negative 11/14>> RPR: Nonreactive  Consults:  General surgery [facility]  Subjective:   No dyspnea, no chest pain, no shortness of breath, no abdominal pain, no nausea or vomiting  Objective: Vitals: Blood pressure (!) 160/100, pulse 78, temperature 97.8 F (36.6 C), temperature source Oral, resp. rate 15, height 4' 11 (1.499 m), weight 58.2 kg, SpO2 97%.   Exam:  Awake Alert, Oriented X 3, No new F.N deficits, Normal affect Symmetrical Chest wall movement, Good air movement bilaterally, CTAB RRR,No Gallops,Rubs or new Murmurs, No Parasternal Heave +ve B.Sounds, Abd Soft, No tenderness, right upper quadrant drain with sublinguals output ty. No Cyanosis, Clubbing or edema, No new Rash or bruise     Assessment/Plan:  acute pancreatitis  epigastric abdominal pain  asymptomatic transaminitis and mildly elevated bilirubin levels Multicystic enhancing liver lesion.   Possible colitis. -No nausea, vomiting or abdominal pain, acute pancreatitis resolved. - No gallstones on imaging, including unremarkable HIDA scan, unremarkable MRCP with no evidence of choledocholithiasis, no major pancreatic abnormality on MRI. - Liver biopsy by IR, no malignant cells insufficient for culture or sensitivity -Right abdomen fluid on repeat imaging 11/25, status post drain placement by IR, negative cytology, drain management by IR - Received 12 days of IV Zosyn , so far cultures are negative, so stop antibiotic 11/27 - Amylase mildly elevated at 334, bilirubin still pending(discussed with lab, still pending at [facility] and will update once results available). - GI and general surgery consulted  Right pleural effusion - Moderate size on recent imaging, s/p thoracentesis 11/28, 350 cc. -Workup significant for transudate, for Gram stain and  cultures are  negative. - Most likely sympathetic pleural effusion due to right upper quadrant process - Cytology still pending   Gram-positive bacteremia-coag negative staph (preliminary result)  likely a contaminant, 1 out of 2 blood cultures, other one showed no growth for completion  HTN Systolic blood pressures remain elevated but diastolics remain low which has been a problem in the past Some tachycardia, so started on metoprolol , but her heart rate on the lower side today so we will decrease metoprolol  to 25 mg p.o. twice daily Hold off on resuming amlodipine , may need to resume if blood pressure increased after decreasing metoprolol  Trial TED stockings, issues with orthostatic hypotension in the past  NAGMA Prerenal AKI on CKD stage IIIa/b Bicarb continues to fluctuate between 16 and 19 Likely 2/2 renal dysfunction, which has been stable at baseline over the past few days after initial AKI Continue to monitor   Normocytic anemia No recent baseline but hemoglobin had been normal over 1 year ago Hemoglobin 11 on admission and dropped to 7.3 on 11/19, s/p 1 unit PRBCs on 11/19 Hemoglobin stable at 9.6 this morning Iron labs most consistent with anemia of chronic disease Continue ferrous sulfate   Hypomagnesemia, hypokalemia, hypophosphatemia Continue to monitor and replete as needed  ?  Mild dementia/cognitive issues B12/RPR all stable Ammonia level is only minimally elevated-doubt of any clinical significance. No confusion this morning-continue to maintain delirium precautions  Elevated TSH TSH 5.8 with T4 1.04.  Likely sick euthyroid and will need repeat TSH in 4 to 6 weeks  Palliative care Previously had long discussion with the patient/daughter regarding findings on MRI that could represent metastatic disease.  Underwent liver biopsy 11/17 and awaiting results but if metastatic disease they seem to be leaning towards stopping further workup but will continue to discuss  Code  status:   Code Status: Full Code   DVT Prophylaxis: Place TED hose Start: 10/19/24 0932 enoxaparin  (LOVENOX ) injection 30 mg Start: 10/18/24 1000 Place and maintain sequential compression device Start: 10/16/24 0843   Family Communication: None at bedside  Disposition Plan:  The patient will require care spanning > 2 midnights and should be moved to inpatient because: Severity of illness   Planned Discharge Destination:Home   Diet: Diet Order             DIET SOFT Room service appropriate? Yes; Fluid consistency: Thin  Diet effective now                    MEDICATIONS: Scheduled Meds:  amLODipine   10 mg Oral Daily   enoxaparin  (LOVENOX ) injection  30 mg Subcutaneous Daily   ferrous sulfate   325 mg Oral Q breakfast   folic acid   1 mg Oral Daily   metoprolol  tartrate  25 mg Oral BID   multivitamin with minerals  1 tablet Oral Daily   pantoprazole   40 mg Oral BID   sodium chloride  flush  5 mL Intracatheter Q8H   Continuous Infusions:   PRN Meds:.acetaminophen , albuterol , diltiazem , hydrALAZINE , LORazepam , ondansetron  **OR** ondansetron  (ZOFRAN ) IV, sodium chloride  flush   I have personally reviewed following labs and imaging studies  LABORATORY DATA:   Data Review:   Inpatient Medications  Scheduled Meds:  amLODipine   10 mg Oral Daily   enoxaparin  (LOVENOX ) injection  30 mg Subcutaneous Daily   ferrous sulfate   325 mg Oral Q breakfast   folic acid   1 mg Oral Daily   metoprolol  tartrate  25 mg Oral BID   multivitamin with minerals  1 tablet Oral Daily   pantoprazole   40 mg Oral BID   sodium chloride  flush  5 mL Intracatheter Q8H   Continuous Infusions:   PRN Meds:.acetaminophen , albuterol , diltiazem , hydrALAZINE , LORazepam , ondansetron  **OR** ondansetron  (ZOFRAN ) IV, sodium chloride  flush  DVT Prophylaxis  Place TED hose Start: 10/19/24 0932 enoxaparin  (LOVENOX ) injection 30 mg Start: 10/18/24 1000 Place and maintain sequential compression device  Start: 10/16/24 0843   Recent Labs  Lab 10/24/24 1123 10/25/24 0020 10/26/24 0245 10/27/24 0333  WBC 11.8* 9.3 6.7 6.4  HGB 11.8* 9.4* 9.6* 9.5*  HCT 35.9* 29.4* 29.4* 29.2*  PLT 297 267 307 373  MCV 81.2 83.3 82.4 83.2  MCH 26.7 26.6 26.9 27.1  MCHC 32.9 32.0 32.7 32.5  RDW 17.2* 17.4* 17.3* 17.9*  LYMPHSABS 0.6* 0.6* 0.7 1.1  MONOABS 0.5 0.6 0.7 0.7  EOSABS 0.0 0.0 0.0 0.0  BASOSABS 0.0 0.0 0.0 0.1    Recent Labs  Lab 10/24/24 1123 10/24/24 1223 10/25/24 0020 10/26/24 0245 10/27/24 0333 10/29/24 0501 10/30/24 0343  NA 136  --  136 138 139 140 141  K 3.3*  --  3.3* 4.2 4.1 3.6 3.7  CL 100  --  103 105 108 108 108  CO2 19*  --  20* 23 23 20* 21*  ANIONGAP 17*  --  13 10 8 12 12   GLUCOSE 88  --  103* 95 88 80 84  BUN 7*  --  6* <5* 5* <5* 6*  CREATININE 1.24*  --  1.23* 1.24* 1.36* 1.40* 1.20*  AST 37  --  32 31 35 38 49*  ALT 27  --  22 22 20 23 27   ALKPHOS 71  --  54 54 50 51 56  BILITOT 1.2  --  1.1 0.9 0.6 0.9 0.8  ALBUMIN 2.5*  --  2.0* 2.0* 2.1* 2.2* 2.4*  CRP 15.7*  --  11.4* 8.9*  --   --   --   PROCALCITON 0.19  --  0.34 0.19 0.15  --   --   INR  --  1.2 1.2  --   --   --   --   MG 1.7  --  2.2 1.9  --   --   --   PHOS  --   --   --   --   --  2.5  --   CALCIUM  9.3  --  8.9 9.3 9.1 9.1 9.2      Recent Labs  Lab 10/24/24 1123 10/24/24 1223 10/25/24 0020 10/26/24 0245 10/27/24 0333 10/29/24 0501 10/30/24 0343  CRP 15.7*  --  11.4* 8.9*  --   --   --   PROCALCITON 0.19  --  0.34 0.19 0.15  --   --   INR  --  1.2 1.2  --   --   --   --   MG 1.7  --  2.2 1.9  --   --   --   CALCIUM  9.3  --  8.9 9.3 9.1 9.1 9.2    --------------------------------------------------------------------------------------------------------------- Lab Results  Component Value Date   CHOL 157 09/01/2023   HDL 86 09/01/2023   LDLCALC 57 09/01/2023   TRIG 74 09/01/2023   CHOLHDL 1.8 09/01/2023    Lab Results  Component Value Date   HGBA1C 6.2 10/02/2022    Radiology Reports  DG Chest 1 View Result Date: 10/28/2024 EXAM: 1 VIEW XRAY OF THE CHEST 10/28/2024 01:08:00 PM COMPARISON: 10/13/2024 CLINICAL HISTORY: S/P thoracentesis  FINDINGS: LUNGS AND PLEURA: No focal pulmonary opacity. No pleural effusion. No pneumothorax. HEART AND MEDIASTINUM: Atherosclerotic calcification of aortic arch. BONES AND SOFT TISSUES: No acute osseous abnormality. IMPRESSION: 1. No acute process. Electronically signed by: Donnice Mania MD 10/28/2024 01:25 PM EST RP Workstation: [id number]      Signature  -    Brayton Lye M.D on 10/30/2024 at 12:44 PM   -  To page go to [email or web address]

## 2024-10-31 ENCOUNTER — Other Ambulatory Visit (HOSPITAL_COMMUNITY): Payer: Self-pay

## 2024-10-31 DIAGNOSIS — K859 Acute pancreatitis without necrosis or infection, unspecified: Secondary | ICD-10-CM | POA: Diagnosis not present

## 2024-10-31 LAB — CBC
HCT: 34.5 % — ABNORMAL LOW (ref 36.0–46.0)
Hemoglobin: 10.9 g/dL — ABNORMAL LOW (ref 12.0–15.0)
MCH: 26.8 pg (ref 26.0–34.0)
MCHC: 31.6 g/dL (ref 30.0–36.0)
MCV: 84.8 fL (ref 80.0–100.0)
Platelets: 425 K/uL — ABNORMAL HIGH (ref 150–400)
RBC: 4.07 MIL/uL (ref 3.87–5.11)
RDW: 17.9 % — ABNORMAL HIGH (ref 11.5–15.5)
WBC: 4.7 K/uL (ref 4.0–10.5)
nRBC: 0 % (ref 0.0–0.2)

## 2024-10-31 MED ORDER — ACETAMINOPHEN 325 MG PO TABS: 650.0000 mg | ORAL_TABLET | Freq: Four times a day (QID) | ORAL | Status: AC | PRN

## 2024-10-31 MED ORDER — FERROUS SULFATE 325 (65 FE) MG PO TABS
325.0000 mg | ORAL_TABLET | Freq: Every day | ORAL | 0 refills | Status: DC
  Filled 2024-10-31: qty 30, 30d supply, fill #0

## 2024-10-31 MED ORDER — METOPROLOL TARTRATE 25 MG PO TABS
12.5000 mg | ORAL_TABLET | Freq: Two times a day (BID) | ORAL | 0 refills | Status: DC
  Filled 2024-10-31: qty 30, 30d supply, fill #0

## 2024-10-31 MED ORDER — ADULT MULTIVITAMIN W/MINERALS CH
1.0000 | ORAL_TABLET | Freq: Every day | ORAL | 0 refills | Status: DC
  Filled 2024-10-31: qty 30, 30d supply, fill #0

## 2024-10-31 MED ORDER — PANTOPRAZOLE SODIUM 40 MG PO TBEC
40.0000 mg | DELAYED_RELEASE_TABLET | Freq: Two times a day (BID) | ORAL | 0 refills | Status: DC
  Filled 2024-10-31: qty 60, 30d supply, fill #0

## 2024-10-31 MED ORDER — FOLIC ACID 1 MG PO TABS
1.0000 mg | ORAL_TABLET | Freq: Every day | ORAL | 0 refills | Status: DC
  Filled 2024-10-31: qty 30, 30d supply, fill #0

## 2024-10-31 MED ORDER — AMLODIPINE BESYLATE 10 MG PO TABS
10.0000 mg | ORAL_TABLET | Freq: Every day | ORAL | 0 refills | Status: DC
  Filled 2024-10-31: qty 30, 30d supply, fill #0

## 2024-10-31 MED ORDER — SODIUM CHLORIDE 0.9% FLUSH
5.0000 mL | Freq: Every day | INTRAVENOUS | 0 refills | Status: AC
  Filled 2024-10-31: qty 300, 60d supply, fill #0

## 2024-10-31 MED ORDER — POLYETHYLENE GLYCOL 3350 17 G PO PACK
34.0000 g | PACK | Freq: Once | ORAL | Status: AC
  Administered 2024-10-31: 34 g via ORAL
  Filled 2024-10-31: qty 2

## 2024-10-31 NOTE — Progress Notes (Addendum)
 Referring Physician(s): Almarie Pringle, PA-C   Supervising Physician: Dr. Ester Sides  Patient Status:  [facility] - In-pt  Chief Complaint:  S/p abd fluid collection drain placed 10/25/24 by Dr. Sides   Subjective:  Sitting up in chair, no family at bedside. My understanding is that her family and home health will be assisting with drain care. She is interested in her care and in no acute distress. Denies any complaint including fever, chills, N/V, abd pain, and she tells me she is looking forward to potential discharge today.    Allergies: Lisinopril   Medications: Prior to Admission medications   Not on File     Vital Signs: BP (!) 145/46 (BP Location: Left Arm)   Pulse 61   Temp 98.5 F (36.9 C) (Oral)   Resp 13   Ht 4' 11 (1.499 m)   Wt 128 lb 4.9 oz (58.2 kg)   SpO2 100%   BMI 25.91 kg/m   Physical Exam Vitals reviewed.  Constitutional:      General: She is not in acute distress.    Appearance: She is not ill-appearing.  HENT:     Head: Normocephalic and atraumatic.  Pulmonary:     Effort: Pulmonary effort is normal.  Abdominal:     General: Abdomen is flat.     Comments: Positive RLQ drain to a suction bulb. Site is unremarkable with no erythema, edema, tenderness, bleeding or drainage. Suture and stat lock in place. Dressing is clean, dry, and intact. ~15 ml of  dark brown colored fluid noted in the bulb. Drain aspirates and flushes well.    Skin:    General: Skin is warm and dry.     Coloration: Skin is not cyanotic or jaundiced.  Neurological:     Mental Status: She is alert.  Psychiatric:        Mood and Affect: Mood normal.        Behavior: Behavior normal.     Imaging: DG Chest 1 View Result Date: 10/28/2024 EXAM: 1 VIEW XRAY OF THE CHEST 10/28/2024 01:08:00 PM COMPARISON: 10/13/2024 CLINICAL HISTORY: S/P thoracentesis FINDINGS: LUNGS AND PLEURA: No focal pulmonary opacity. No pleural effusion. No pneumothorax. HEART AND MEDIASTINUM:  Atherosclerotic calcification of aortic arch. BONES AND SOFT TISSUES: No acute osseous abnormality. IMPRESSION: 1. No acute process. Electronically signed by: Donnice Mania MD 10/28/2024 01:25 PM EST RP Workstation: [id number]    Labs:  CBC: Recent Labs    10/25/24 0020 10/26/24 0245 10/27/24 0333 10/31/24 0739  WBC 9.3 6.7 6.4 4.7  HGB 9.4* 9.6* 9.5* 10.9*  HCT 29.4* 29.4* 29.2* 34.5*  PLT 267 307 373 425*    COAGS: Recent Labs    10/13/24 0517 10/17/24 0404 10/24/24 1223 10/25/24 0020  INR 1.1 1.1 1.2 1.2    BMP: Recent Labs    10/26/24 0245 10/27/24 0333 10/29/24 0501 10/30/24 0343  NA 138 139 140 141  K 4.2 4.1 3.6 3.7  CL 105 108 108 108  CO2 23 23 20* 21*  GLUCOSE 95 88 80 84  BUN <5* 5* <5* 6*  CALCIUM  9.3 9.1 9.1 9.2  CREATININE 1.24* 1.36* 1.40* 1.20*  GFRNONAA 42* 38* 37* 44*    LIVER FUNCTION TESTS: Recent Labs    10/26/24 0245 10/27/24 0333 10/28/24 1354 10/29/24 0501 10/30/24 0343  BILITOT 0.9 0.6  --  0.9 0.8  AST 31 35  --  38 49*  ALT 22 20  --  23 27  ALKPHOS 54 50  --  51 56  PROT 5.3* 5.5* 7.1 5.4* 5.8*  ALBUMIN 2.0* 2.1*  --  2.2* 2.4*    Assessment and Plan:  88 y.o. female with acute pancreatitis and R RP fluid collection s/p RUQ drain placement by Dr. Jennefer on 10/25/24 - aspirated 100 mL of thin, dark sanguinous fluid.   VSS CBC yesterday stable, no leukocytosis  Cx negative so far  Output 40 mL in past 24 hr, appears thin, dark brown/maroon   Drain Location: RUQ Size: Fr size: 10 Fr Date of placement: 11/25  Currently to: Drain collection device: suction bulb 24 hour output:  Output by Drain (mL) 10/29/24 0701 - 10/29/24 1900 10/29/24 1901 - 10/30/24 0700 10/30/24 0701 - 10/30/24 1900 10/30/24 1901 - 10/31/24 0700 10/31/24 0701 - 10/31/24 1020  Closed System Drain Lateral;Right Abdomen Bulb (JP) 10 Fr.  30 15 25      Interval imaging/drain manipulation:  None and none indicated prior to DC. CT abd/pelv w/o ordered  for clinic based on previous imaging and renal fx.   Current examination: Flushes/aspirates easily.  Insertion site unremarkable. Suture and stat lock in place. Dressed appropriately.  Output > 15 cc in past 24 hr.   Plan: While Inpatient:  Continue TID flushes with 5 cc NS. Record output Q shift. Dressing changes QD or PRN if soiled.  Call IR APP or on call IR MD if difficulty flushing or sudden change in drain output.  Repeat imaging/possible drain injection once output < 10 mL/QD (excluding flush material). Consideration for drain removal if output is < 10 mL/QD (excluding flush material), pending discussion with the providing surgical service.  Discharge planning: IP MD reached out to IR this AM and communicated likely DC today.  Orders placed for saline flushes to [facility].  Patient will follow up with IR clinic 10-14 days post d/c for repeat imaging/possible drain injection. IR scheduler will contact patient with date/time of appointment.  After discharge, patient will do the following to care for her drain: Patient will need to flush drain QD with 5 cc NS, record output QD, dressing changes every 2-3 days or earlier if soiled. This is included in DC summary along with number for patient care line. No family at bedside to demonstrate flushing; however, it appears that IP has included home health orders as part of DC.   IR will continue to follow - please call with questions or concerns.  Electronically Signed: Laymon Coast, NP 10/31/2024, 10:20 AM   I spent a total of 15 Minutes at the the patient's bedside AND on the patient's hospital floor or unit, greater than 50% of which was counseling/coordinating care for RUQ drain f/u.

## 2024-10-31 NOTE — Progress Notes (Signed)
 RN provided education about JP drain flushing and care, RN demonstrated JP care, patient also demonstrated on her own using teach back method. Supplies are also provided with the patient.

## 2024-10-31 NOTE — Progress Notes (Signed)
 Occupational Therapy Treatment Patient Details Name: Haley Randall MRN: [id number] DOB: 1936-07-18 Today's Date: 10/31/2024   History of present illness Patient is an 88 yo female presenting to the ED with abdominal pain on 10/13/24. Admitted with pancreatitis/vague liver lesions on CT abdomen. MRI on 11/14 finding Multicystic enhancing lesions in the liver with CT guided livery biopsy completed on 11/17 (results still pending). RUQ drain placed on 11/25 and R pleural effusion, with thoracentesis on 11/28 with 350 cc's drained. PMH includes:  HTN, HLD   OT comments  Patient seen in order to progress with functional activity tolerance, assess cognition and ADL management prior to discharge home. Patient is set up for ADL management, and is supervision for functional mobility with rollator. Patient was under the impression she is getting a rollator for home, however in previous case management notes patient has a RW at home if needed and declined further equipment (patient with STM deficits at baseline). Patient able to complete dual task activities with extra time, and minimal cues to navigate back to her room. Patient will have assist from family ensuring safety at home from a cognitive perspective. Patient requires no follow up OT at discharge, and OT will sign off at this time, benefitting from work with mobility specialist during this acute stay.   HR 92-101 during ambulation       If plan is discharge home, recommend the following:  Assist for transportation;Assistance with cooking/housework;Direct supervision/assist for medications management;Direct supervision/assist for financial management (initially)   Equipment Recommendations  None recommended by OT    Recommendations for Other Services      Precautions / Restrictions Precautions Precautions: Fall;Other (comment) Recall of Precautions/Restrictions: Intact Precaution/Restrictions Comments: RUQ drain Restrictions Weight Bearing  Restrictions Per Provider Order: No       Mobility Bed Mobility               General bed mobility comments: up in recliner upon OT arrival    Transfers Overall transfer level: Needs assistance Equipment used: Rollator (4 wheels) Transfers: Sit to/from Stand Sit to Stand: Supervision           General transfer comment: supervision for safety, cues for brake management     Balance Overall balance assessment: Needs assistance Sitting-balance support: Feet supported Sitting balance-Leahy Scale: Good     Standing balance support: During functional activity, Reliant on assistive device for balance, Bilateral upper extremity supported Standing balance-Leahy Scale: Fair Standing balance comment: is improving                           ADL either performed or assessed with clinical judgement   ADL Overall ADL's : Needs assistance/impaired     Grooming: Set up;Standing           Upper Body Dressing : Set up;Sitting       Toilet Transfer: Supervision/safety           Functional mobility during ADLs: Set up General ADL Comments: Patient seen in order to progress with functional activity tolerance, assess cognition and ADL management prior to discharge home. Patient is set up for ADL management, and is supervision for functional mobility with rollator. Patient was under the impression she is getting a rollator for home, however in previous case management notes patient has a RW at home if needed and declined further equipment (patient with STM deficits at baseline). Patient able to complete dual task activities with extra time, and minimal cues to  navigate back to her room. Patient will have assist from family ensuring safety at home from a cognitive perspective. Patient requires no follow up OT at discharge, and OT will sign off at this time, benefitting from work with mobility specialist during this acute stay.    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                 Following commands: Intact        Cueing   Cueing Techniques: Verbal cues  Exercises      Shoulder Instructions       General Comments HR 92- 101 BPM during ambulation    Pertinent Vitals/ Pain       Pain Assessment Pain Assessment: No/denies pain  Home Living                                          Prior Functioning/Environment              Frequency  Min 1X/week        Progress Toward Goals  OT Goals(current goals can now be found in the care plan section)  Progress towards OT goals: Progressing toward goals  Acute Rehab OT Goals Patient Stated Goal: to go home OT Goal Formulation: With patient Time For Goal Achievement: 11/07/24 Potential to Achieve Goals: Good  Plan      Co-evaluation                 AM-PAC OT 6 Clicks Daily Activity     Outcome Measure   Help from another person eating meals?: A Little Help from another person taking care of personal grooming?: A Little Help from another person toileting, which includes using toliet, bedpan, or urinal?: A Little Help from another person bathing (including washing, rinsing, drying)?: A Little Help from another person to put on and taking off regular upper body clothing?: A Little Help from another person to put on and taking off regular lower body clothing?: A Little 6 Click Score: 18    End of Session Equipment Utilized During Treatment: Rollator (4 wheels)  OT Visit Diagnosis: Unsteadiness on feet (R26.81)   Activity Tolerance Patient tolerated treatment well   Patient Left in chair;with call bell/phone within reach   Nurse  Communication Mobility status        Time: 8964-8947 OT Time Calculation (min): 17 min  Charges: OT General Charges $OT Visit: 1 Visit OT Treatments $Self Care/Home Management : 8-22 mins  Haley Randall, OTR/L Acute Rehabilitation Services [phone]   Haley Randall 10/31/2024, 12:30 PM

## 2024-10-31 NOTE — Discharge Instructions (Signed)
 Interventional Radiology Percutaneous Abscess Drain Placement After Care   This sheet gives you information about how to care for yourself after your procedure. Your health care provider may also give you more specific instructions. Your drain was placed by an interventional radiologist with [facility]. If you have questions or concerns, contact [facility] at [phone]. The clinic staff will be calling you to set up a follow up appointment to assess the drain in your abdomen in approx 10 days from placement. Saline flushes have been prescribed for daily flushing of the catheter (you will flush once daily rather than three times daily like while you were admitted).   What is a percutaneous drain?   A drain is a small plastic tube (catheter) that goes into the fluid collection in your body through your skin.   How long will I need the drain?   How long the drain needs to stay in is determined by where the drain is, how much comes out of the drain each day and if you are having any other surgical procedures.   Interventional radiology will determine when it is time to remove the drain. It is important to follow up as directed so that the drain can be removed as soon as it is safe to do so.   What can I expect after the procedure?   After the procedure, it is common to have:   A small amount of bruising and discomfort in the area where the drainage tube (catheter) was placed.   Sleepiness and fatigue. This should go away after the medicines you were given have worn off.   Follow these instructions at home:   Insertion site care   Check your insertion site when you change the bandage. Check for:   More redness, swelling, or pain.   More fluid or blood.   Warmth.   Pus or a bad smell.   When caring for your insertion site:   Wash your hands with soap and water for at least 20 seconds before and after you change your bandage (dressing). If soap and water are  not available, use hand sanitizer.   You do not need to change your dressing everyday if it is clean and dry. Change your dressing every 3 days or as needed when it is soiled, wet or becoming dislodged. You will need to change your dressing each time you shower.   Leave stitches (sutures), skin glue, or adhesive strips in place. These skin closures may need to stay in place for 2 weeks or longer. If adhesive strip edges start to loosen and curl up, you may trim the loose edges. Do not remove adhesive strips completely unless your health care provider tells you to do so.   Catheter care   Flush the catheter once per day with 5 mL of 0.9% normal saline unless you are told otherwise by your healthcare provider. This helps to prevent clogs in the catheter.   To disconnect the drain, turn the clear plastic tube to the left. Attach the saline syringe by placing it on the white end of the drain and turning gently to the right. Once attached gently push the plunger to the 5 mL mark. After you are done flushing, disconnect the syringe by turning to the left and reattach your drainage container   If you have a bulb please be sure the bulb is charged after reconnecting it - to do this pinch the bulb between your thumb and first finger and  close the stopper located on the top of the bulb.    Check for fluid leaking from around your catheter (instead of fluid draining through your catheter). This may be a sign that the drain is no longer working correctly.   Write down the following information every time you empty your bag:   The date and time.   The amount of drainage.   Activity   Rest at home for 1-2 days after your procedure.   For the first 48 hours do not lift anything more than 10 lbs (about a gallon of milk). You may perform moderate activities/exercise. Please avoid strenuous activities during this time.   Avoid any activities which may pull on your drain as this can cause your drain to  become dislodged.   If you were given a sedative during the procedure, it can affect you for several hours. Do not drive or operate machinery until your health care provider says that it is safe.   General instructions   For mild pain take over-the-counter medications as needed for pain such as Tylenol  or Advil . If you are experiencing severe pain please call our office as this may indicate an issue with your drain.    If you were prescribed an antibiotic medicine, take it as told by your health care provider. Do not stop using the antibiotic even if you start to feel better.   You may shower 24 hours after the drain is placed. To do this cover the insertion site with a water tight material such as saran wrap and seal the edges with tape, you may also purchase waterproof dressings at your local drug store. Shower as usual and then remove the water tight dressing and any gauze/tape underneath it once you have exited the shower and dried off. Allow the area to air dry or pat dry with a clean towel. Once the skin is completely dry place a new gauze dressing. It is important to keep the site dry at all times to prevent infection.   Do not submerge the drain - this means you cannot take baths, swim, use a hot tub, etc. until the drain is removed.    Do not use any products that contain nicotine or tobacco, such as cigarettes, e-cigarettes, and chewing tobacco. If you need help quitting, ask your health care provider.   Keep all follow-up visits as told by your health care provider. This is important.   Contact a health care provider if:   You have less than 10 mL of drainage a day for 2-3 days in a row, or as directed by your health care provider.   You have any of these signs of infection:   More redness, swelling, or pain around your incision area.   More fluid or blood coming from your incision area.   Warmth coming from your incision area.   Pus or a bad smell coming from your incision  area.   You have fluid leaking from around your catheter (instead of through your catheter).   You are unable to flush the drain.   You have a fever or chills.   You have pain that does not get better with medicine.   You have not been contacted to schedule a drain follow up appointment within 10 days of discharge from the hospital.   Please call [facility] at [phone] with any questions or concerns.   Get help right away if:   Your catheter comes out.   You  suddenly stop having drainage from your catheter.   You suddenly have blood in the fluid that is draining from your catheter.   You become dizzy or you faint.   You develop a rash.   You have nausea or vomiting.   You have difficulty breathing or you feel short of breath.   You develop chest pain.   You have problems with your speech or vision.   You have trouble balancing or moving your arms or legs.   Summary   It is common to have a small amount of bruising and discomfort in the area where the drainage tube (catheter) was placed. You may also have minor discomfort with movement while the drain is in place.   Flush the drain once per day with 5 mL of 0.9% normal saline (unless you were told otherwise by your healthcare provider).    Record the amount of drainage from the bag every time you empty it.   Change the dressing every 3 days or earlier if soiled/wet. Keep the skin dry under the dressing.   You may shower with the drain in place. Do not submerge the drain (no baths, swimming, hot tubs, etc.).   Contact [facility] at [phone] if you have more redness, swelling, or pain around your incision area or if you have pain that does not get better with medicine.   This information is not intended to replace advice given to you by your health care provider. Make sure you discuss any questions you have with your health care provider.   Document Revised: 02/20/2022 Document  Reviewed: 11/12/2019   [employer]vier Patient Education  2023 [employer]         Interventional Radiology Drain Record   Empty your drain at least once per day. You may empty it as often as needed. Use this form to write down the amount of fluid that has collected in the drainage container. Bring this form with you to your follow-up visits. Please call [facility]iology at [phone] with any questions or concerns prior to your appointment.   Drain #1 location: ___________________   Date __________ Time __________ Amount __________   Date __________ Time __________ Amount __________   Date __________ Time __________ Amount __________   Date __________ Time __________ Amount __________   Date __________ Time __________ Amount __________   Date __________ Time __________ Amount __________   Date __________ Time __________ Amount __________   Date __________ Time __________ Amount __________   Date __________ Time __________ Amount __________   Date __________ Time __________ Amount __________   Date __________ Time __________ Amount __________   Date __________ Time __________ Amount __________   Date __________ Time __________ Amount __________   Date __________ Time __________ Amount __________

## 2024-10-31 NOTE — TOC Transition Note (Signed)
 Transition of Care [facility]) - Discharge Note   Patient Details  Name: Haley Randall MRN: [id number] Date of Birth: Jul 18, 1936  [facility] CM/SW Contact:  Landry DELENA Senters, RN Phone Number: 10/31/2024, 12:11 PM   Clinical Narrative:     Patient will be discharging home today, with transportation from family.   HH arranaged via [facility] for therapy and nursing, info on AVS. Patient will be going home with RLQ abdominal drain. CM did notify bedside nurse that patient/family will need to be shown how to drain this at home and sent with several flushes.   Patient denies need for any further DME, has all needed equipment at home already.   No further needs identified by CM.   Final next level of care: Home w Home Health Services Barriers to Discharge: No Barriers Identified   Patient Goals and CMS Choice Patient states their goals for this hospitalization and ongoing recovery are:: to go home CMS Medicare.gov Compare Post Acute Care list provided to:: Patient Choice offered to / list presented to : Patient      Discharge Placement                       Discharge Plan and Services Additional resources added to the After Visit Summary for     Discharge Planning Services: CM Consult Post Acute Care Choice: Home Health          DME Arranged: N/A         HH Arranged: RN, PT, OT HH Agency: [facility] Date [facility] Agency Contacted: 10/31/24 Time HH Agency Contacted: 1211 Representative spoke with at [facility] Agency: Joane  Social Drivers of Health (SDOH) Interventions SDOH Screenings   Food Insecurity: No Food Insecurity (10/13/2024)  Housing: Low Risk  (10/13/2024)  Transportation Needs: No Transportation Needs (10/13/2024)  Utilities: Not At Risk (10/13/2024)  Alcohol Screen: Low Risk  (08/24/2023)  Depression (PHQ2-9): Low Risk  (08/24/2023)  Financial Resource Strain: Low Risk  (08/24/2023)  Physical Activity: Inactive (08/24/2023)  Social Connections:  Socially Isolated (10/13/2024)  Stress: No Stress Concern Present (08/24/2023)  Tobacco Use: Medium Risk (10/13/2024)  Health Literacy: Adequate Health Literacy (08/24/2023)     Readmission Risk Interventions     No data to display

## 2024-10-31 NOTE — Discharge Summary (Signed)
 Physician Discharge Summary  Haley Randall FMW:[id number] DOB: [birth year] DOA: 10/13/2024  PCP: Corwin Antu, FNP  Admit date: 10/13/2024 Discharge date: 10/31/2024  Admitted From: (Home) Disposition:  (Home )  Recommendations for Outpatient Follow-up:  Follow up with PCP in 1-2 weeks Please 10 CBC, CMP in 10 to 14 days Patient to follow with GI, to arrange for a follow-up appointment in January  IR to arrange for follow-up for evaluation for right upper quadrant drain removal Please follow on the final results of right pleural effusion cytology. Please follow on the final results of bilirubin level from the right upper quadrant fluid drain.  Home Health: (YES) PT and RN  Diet recommendation: Heart Healthy    Brief/Interim Summary:  Patient is a 88 y.o.  female with history of HTN, HLD-who presented with abdominal pain-found to have pancreatitis/vague liver lesions on CT abdomen.   Significant events: 11/13>> admit to [facility]   Significant studies: 11/13>> CXR: No PNA 11/13>> CT abdomen/pelvis: 4.8 x 3.1 cm subtle lesion-right liver-additional small lesions measuring up to 2.1 cm.  Gallbladder distended.  Edema/inflammation along the descending ascending colon/hepatic flexure. 11/13>> RUQ ultrasound: Gallbladder wall upper normal to borderline thickened, no echogenic gallstones.  CBD 8 mm. 11/14>> MRI abdomen: Multicystic enhancing lesions in the liver-abscesses versus metastatic disease 10/17/2024.  CT-guided liver biopsy by IR   10/22/24 - repeat MRCP -  1. Progressive complex fluid accumulation within the right hemiabdomen with early loculation. The complex fluid shows increased T1 signal, which may be seen with underlying concentrated bile duct, blood products or proteinaceous material. A nuclear medicine HIDA scan may be helpful to exclude bile leak. 2. New gallbladder wall edema with pericholecystic fluid. No gallstones identified. 3. Persistent area of hyperemia surrounding  multiple cystic areas in the periphery of segment 7 and dome of liver which may re represent multiple dilated biliary radicles in the setting of cholangitis. No discrete drainable abscess or mass noted. 4. Interval increase in volume of right pleural effusion, now moderate in volume, with overlying atelectasis or consolidation. 11/24>> HIDA scan nonacute 11/25>> right upper quadrant drain placement by IR 11/29>> right thoracentesis      Consults:   General surgery IR [facility]     Epigastric abdominal pain acute pancreatitis  asymptomatic transaminitis and mildly elevated bilirubin levels Multicystic enhancing liver lesion.   Possible colitis. - Currently no nausea, vomiting or abdominal pain, acute pancreatitis resolved. - No gallstones on imaging, including unremarkable HIDA scan, unremarkable MRCP with no evidence of choledocholithiasis, no major pancreatic abnormality on MRI. - Liver biopsy by IR, no malignant cells insufficient for culture or sensitivity -Right abdomen fluid on repeat imaging 11/25, status post drain placement by IR, cytology is negative for malignancy, Gram stain and cultures negative, drain management by IR, currently with low output up to 40 cc/day, IR will arrange for outpatient follow-up regarding repeat imaging and drain removal. - Received 12 days of IV Zosyn , so far cultures are negative, chronic right upper quadrant drain, thoracentesis, and currently with no evidence of active infection, so stop antibiotic 11/27, so far no leukocytosis, fever or worsening pain. - Amylase mildly elevated at 334, bilirubin still pending(discussed with lab, still pending at [facility] and will update once results available). - GI and general surgery consulted, to follow-up with GI as an outpatient   Right pleural effusion - Moderate size on recent imaging, s/p thoracentesis 11/28, 350 cc. -Workup significant for transudate, for Gram stain and cultures are  negative. - Most likely  sympathetic pleural effusion due to right upper quadrant process - Cytology still pending     Gram-positive bacteremia-coag negative staph (preliminary result)  likely a contaminant, 1 out of 2 blood cultures, other one showed no growth for completion   HTN Systolic blood pressures remain elevated but diastolics remain low which has been a problem in the past Some tachycardia, so started on metoprolol , but metoprolol  dose has been lowered to 12.5 mg p.o. twice daily given some bradycardia . - Started on amlodipine  NAGMA Prerenal AKI on CKD stage IIIa/b Bicarb continues to fluctuate between 16 and 19 Likely 2/2 renal dysfunction, which has been stable at baseline over the past few days after initial AKI Continue to monitor    Normocytic anemia No recent baseline but hemoglobin had been normal over 1 year ago Hemoglobin 11 on admission and dropped to 7.3 on 11/19, s/p 1 unit PRBCs on 11/19 Hemoglobin stable at 9.6 this morning Iron labs most consistent with anemia of chronic disease Continue ferrous sulfate  on discharge   Hypomagnesemia, hypokalemia, hypophosphatemia Replaced   Mild dementia/cognitive issues B12/RPR all stable Ammonia level is only minimally elevated-doubt of any clinical significance.  Repeat within normal limit. No confusion this morning-continue to maintain delirium precautions   Elevated TSH TSH 5.8 with T4 1.04.  Likely sick euthyroid and will need repeat TSH in 4 to 6 weeks     Discharge Diagnoses:  Principal Problem:   Abdominal pain Active Problems:   Pancreatitis   Gallbladder dilatation   Acute metabolic encephalopathy   Leukocytosis   Elevated liver function tests   Liver lesion, right lobe   Intra-abdominal abscess (HCC)   Malnutrition of moderate degree   Abnormal CT of the abdomen   Retroperitoneal fluid collection    Discharge Instructions  Discharge Instructions     Diet - low sodium heart healthy    Complete by: As directed    Discharge instructions   Complete by: As directed    Follow with Primary MD Corwin Antu, FNP in 7 days   Get CBC, CMP,  checked  by Primary MD next visit.    Activity: As tolerated with Full fall precautions use walker/cane & assistance as needed   Disposition Home    Diet: Heart Healthy    On your next visit with your primary care physician please Get Medicines reviewed and adjusted.   Please request your Prim.MD to go over all Hospital Tests and Procedure/Radiological results at the follow up, please get all Hospital records sent to your Prim MD by signing hospital release before you go home.   If you experience worsening of your admission symptoms, develop shortness of breath, life threatening emergency, suicidal or homicidal thoughts you must seek medical attention immediately by calling 911 or calling your MD immediately  if symptoms less severe.  You Must read complete instructions/literature along with all the possible adverse reactions/side effects for all the Medicines you take and that have been prescribed to you. Take any new Medicines after you have completely understood and accpet all the possible adverse reactions/side effects.   Do not drive, operating heavy machinery, perform activities at heights, swimming or participation in water activities or provide baby sitting services if your were admitted for syncope or siezures until you have seen by Primary MD or a Neurologist and advised to do so again.  Do not drive when taking Pain medications.    Do not take more than prescribed Pain, Sleep and Anxiety Medications  Special Instructions: If  you have smoked or chewed Tobacco  in the last 2 yrs please stop smoking, stop any regular Alcohol  and or any Recreational drug use.  Wear Seat belts while driving.   Please note  You were cared for by a hospitalist during your hospital stay. If you have any questions about your discharge  medications or the care you received while you were in the hospital after you are discharged, you can call the unit and asked to speak with the hospitalist on call if the hospitalist that took care of you is not available. Once you are discharged, your primary care physician will handle any further medical issues. Please note that NO REFILLS for any discharge medications will be authorized once you are discharged, as it is imperative that you return to your primary care physician (or establish a relationship with a primary care physician if you do not have one) for your aftercare needs so that they can reassess your need for medications and monitor your lab values.   Discharge wound care:   Complete by: As directed    Patient will follow up with IR clinic 10-14 days post d/c for repeat imaging/possible drain injection. IR scheduler will contact patient with date/time of appointment.  After discharge, patient will do the following to care for her drain: Patient will need to flush drain QD with 5 cc NS, record output QD, dressing changes every 2-3 days or earlier if soiled   Increase activity slowly   Complete by: As directed       Allergies as of 10/31/2024       Reactions   Lisinopril  Other (See Comments)   Causes nose bleeds        Medication List     TAKE these medications    acetaminophen  325 MG tablet Commonly known as: TYLENOL  Take 2 tablets (650 mg total) by mouth every 6 (six) hours as needed for moderate pain (pain score 4-6) (headache).   amLODipine  10 MG tablet Commonly known as: NORVASC  Take 1 tablet (10 mg total) by mouth daily. Start taking on: November 01, 2024   CertaVite/Antioxidants Tabs Take 1 tablet by mouth daily. Start taking on: November 01, 2024   FeroSul 325 (65 Fe) MG tablet Generic drug: ferrous sulfate  Take 1 tablet (325 mg total) by mouth daily with breakfast. Start taking on: November 01, 2024   folic acid  1 MG tablet Commonly known as:  FOLVITE  Take 1 tablet (1 mg total) by mouth daily. Start taking on: November 01, 2024   metoprolol  tartrate 25 MG tablet Commonly known as: LOPRESSOR  Take 0.5 tablets (12.5 mg total) by mouth 2 (two) times daily.   Normal Saline Flush 0.9 % Soln Flush your abd drain with 5 mL of NS from the prefilled syringe once daily. Do not forget to charge the bulb again after administering this.   pantoprazole  40 MG tablet Commonly known as: PROTONIX  Take 1 tablet (40 mg total) by mouth 2 (two) times daily.               Discharge Care Instructions  (From admission, onward)           Start     Ordered   10/31/24 0000  Discharge wound care:       Comments: Patient will follow up with IR clinic 10-14 days post d/c for repeat imaging/possible drain injection. IR scheduler will contact patient with date/time of appointment.  After discharge, patient will do the following to care for her  drain: Patient will need to flush drain QD with 5 cc NS, record output QD, dressing changes every 2-3 days or earlier if soiled   10/31/24 1150            Contact information for follow-up providers     [facility] IR Imaging Follow up.   Specialty: Radiology Why: Patient care line number is printed as part of your discharge instructions. The clinic will call you to set f/u appt. Contact information: [address] [place]  [address] [phone]             Contact information for after-discharge care     Home Medical Care     [facility]) .   Service: Home Health Services Contact information: [address] [place]  [address] [phone]                    Allergies  Allergen Reactions   Lisinopril  Other (See Comments)    Causes nose bleeds      Procedures/Studies: DG Chest 1 View Result Date: 10/28/2024 EXAM: 1 VIEW XRAY OF THE CHEST 10/28/2024 01:08:00 PM COMPARISON: 10/13/2024 CLINICAL  HISTORY: S/P thoracentesis FINDINGS: LUNGS AND PLEURA: No focal pulmonary opacity. No pleural effusion. No pneumothorax. HEART AND MEDIASTINUM: Atherosclerotic calcification of aortic arch. BONES AND SOFT TISSUES: No acute osseous abnormality. IMPRESSION: 1. No acute process. Electronically signed by: Donnice Mania MD 10/28/2024 01:25 PM EST RP Workstation: [id number]   CT GUIDED PERITONEAL/RETROPERITONEAL FLUID DRAIN BY PERC CATH Result Date: 10/25/2024 INDICATION: 88 year old female with enlarging right retroperitoneal fluid collection. EXAM: CT PERC DRAIN PERITONEAL ABCESS COMPARISON:  CT abdomen pelvis from earlier the same day MEDICATIONS: The patient is currently admitted to the hospital and receiving intravenous antibiotics. The antibiotics were administered within an appropriate time frame prior to the initiation of the procedure. ANESTHESIA/SEDATION: Moderate (conscious) sedation was employed during this procedure. A total of Versed  1 mg and Fentanyl  50 mcg was administered intravenously. Moderate Sedation Time: 14 minutes. The patient's level of consciousness and vital signs were monitored continuously by radiology nursing throughout the procedure under my direct supervision. CONTRAST:  None COMPLICATIONS: None immediate. PROCEDURE: RADIATION DOSE REDUCTION: This exam was performed according to the departmental dose-optimization program which includes automated exposure control, adjustment of the mA and/or kV according to patient size and/or use of iterative reconstruction technique. Informed written consent was obtained from the patient after a discussion of the risks, benefits and alternatives to treatment. The patient was placed supine on the CT gantry and a pre procedural CT was performed re-demonstrating the known abscess/fluid collection within the right retroperitoneum. The procedure was planned. A timeout was performed prior to the initiation of the procedure. The right lower abdominal mid  axillary region was prepped and draped in the usual sterile fashion. The overlying soft tissues were anesthetized with 1% lidocaine  with epinephrine. Appropriate trajectory was planned with the use of a 22 gauge spinal needle. An 18 gauge trocar needle was advanced into the abscess/fluid collection and a short Amplatz super stiff wire was coiled within the collection. Appropriate positioning was confirmed with a limited CT scan. The tract was serially dilated allowing placement of a 10 French all-purpose drainage catheter. Appropriate positioning was confirmed with a limited postprocedural CT scan. Approximately 100 ml of thin, brown fluid was aspirated. The tube was connected to a bulb suction and sutured in place. A dressing was placed. The patient tolerated the procedure well without  immediate post procedural complication. IMPRESSION: Successful CT guided placement of a 10 French all purpose drain catheter into the right retroperitoneal fluid collection with aspiration of approximately 100 mL of thin brown fluid. Samples were sent to the laboratory as requested by the ordering clinical team. Ester Sides, MD [facility] Rad[facility] Electronically Signed   By: Ester Sides M.D.   On: 10/25/2024 18:14   CT ABDOMEN PELVIS WO CONTRAST Result Date: 10/25/2024 EXAM: CT ABDOMEN AND PELVIS WITHOUT CONTRAST 10/25/2024 10:34:25 AM TECHNIQUE: CT of the abdomen and pelvis was performed without the administration of intravenous contrast. Multiplanar reformatted images are provided for review. Automated exposure control, iterative reconstruction, and/or weight-based adjustment of the mA/kV was utilized to reduce the radiation dose to as low as reasonably achievable. COMPARISON: MRI 10/22/2024, CT 10/13/2024. CLINICAL HISTORY: Abdominal pain, acute, nonlocalized; Intra-abdominal fluid collection nonspecific source. FINDINGS: LOWER CHEST: Moderate right pleural effusion  unchanged from MRI. LIVER: Hypodensities in the right hepatic lobe again noted. GALLBLADDER AND BILE DUCTS: Gallbladder is grossly normal on noncontrast exam. No biliary ductal dilatation. SPLEEN: No acute abnormality. PANCREAS: No acute abnormality. ADRENAL GLANDS: No acute abnormality. KIDNEYS, URETERS AND BLADDER: No stones in the kidneys or ureters. No hydronephrosis. No perinephric or periureteral stranding. Urinary bladder is unremarkable. GI AND BOWEL: Stomach demonstrates no acute abnormality. There is no bowel obstruction. PERITONEUM AND RETROPERITONEUM: Fluid collection along the right paracolic gutter extending from the inferior margin of the right hepatic lobe into the iliac fossa. Low density fluid. The dimensions are similar to comparison MRI. There is some organization noted on comparison MRI. The greatest volume in axial dimension is within the iliac fossa on the right measuring 6.7 x 8.6 cm. Small amount of fluid in the pelvis. No free air. VASCULATURE: Aorta is normal in caliber. LYMPH NODES: No lymphadenopathy. REPRODUCTIVE ORGANS: Uterus is no longer normal. BONES AND SOFT TISSUES: No acute osseous abnormality. No focal soft tissue abnormality. IMPRESSION: 1. Persistent low density fluid collection in the right pericardial gutter similar to comparison MRI with the greatest dimension of volume within the right iliac fossa. On comparison MRI the collection appeared mildly complex and organized. No iv contrast on current exam. 2. Moderate volume right pleural effusion similar to prior. 3. Gallbladder grossly normal on noncontrast exam. Electronically signed by: Norleen Boxer MD 10/25/2024 11:17 AM EST RP Workstation: [id number]   NM Hepatobiliary Liver Func Result Date: 10/24/2024 EXAM: NM HEPATOBILLARY SCAN 10/24/2024 05:35:58 PM TECHNIQUE: RADIOPHARMACEUTICAL: 5.5 mCi Tc-77m mebrofenin  Anterior planar images of the abdomen and pelvis were obtained over approximately 1 hour. 2.3 mg Morphine   given intravenously  Dynamic imaging of the abdomen is obtained COMPARISON: MRI 10/22/2024, ultrasound 10/13/2024 CLINICAL HISTORY: Biliary colic, recurrent, gallbladder dyskinesia suspected FINDINGS: Homogenous uptake within the liver. Normal clearance of the blood pool. Appropriate excretion into the biliary system. Activity is visualized in the small bowel. Activity is visualized in the gallbladder following morphine  administration IMPRESSION: 1. Negative for acute cholecystitis ( gallbladder visualized after morphine  administration ) 2. Normal biliary to bowel transit. Electronically signed by: Luke Bun MD 10/24/2024 06:54 PM EST RP Workstation: [id number]   MR LIVER W WO CONTRAST Result Date: 10/22/2024 EXAM: MRI Abdomen with and without Contrast 10/22/2024 11:12:14 AM TECHNIQUE: Multiplanar multisequence MRI of the abdomen was performed with and without the administration of intravenous contrast. 6 mL (gadobutrol  (GADAVIST ) 1 MMOL/ML injection 6 mL GADOBUTROL  1 MMOL/ML IV SOLN). COMPARISON: MRI 10/14/2024. CLINICAL HISTORY: FINDINGS: LIVER: Peripheral, multicystic enhancing process within  segment 7. Imaging findings are favored to represent cystic dilatation of peripheral biliary radicles. No well-defined drainable abscess identified. Surrounding hyperemia on the arterial phase images noted with mural enhancing dilated biliary radicles extending up to this area noted. On the current exam, the affected area measures approximately 5.6 x 3.5 x 6.3 cm (image 27/100). On the previous exam, this area measured 6.3 x 3.8 x 6.9 cm. No new abnormalities identified within the liver. GALLBLADDER AND BILIARY SYSTEM: New gallbladder wall edema is noted with wall thickness measuring up to 6 mm with signs of pericholecystic edema/fluid. No gallstones identified. Common bile duct measures up to 6 mm. No signs of choledocholithiasis. No intrahepatic ductal dilation. SPLEEN: Normal appearance of the spleen. PANCREAS: No  signs of pancreatic main duct dilatation or mass. ADRENAL GLANDS: Unremarkable. KIDNEYS: Bosniak class 1 cyst identified within the inferior pole of the left kidney measuring up to 1.6 cm (image 26/4). LYMPH NODES: No adenopathy identified. VASCULATURE: Extensive aortic atherosclerotic calcifications. PERITONEUM: Progressive complex fluid accumulation within the right hemiabdomen extends from the gallbladder into the right iliac fossa .This collection of fluid measures approximately 8.2 x 6.9 x 20.2 cm. Overlying enhancement is noted involving the peritoneal reflection with signs suggestive of early loculation. The fluid shows mixed signal intensity but is predominantly increased on the T1 weighted sequences and increased on the T2-weighted sequences which may be seen with a biloma and/or hematoma. BOWEL: As noted previously, the duodenum appears edematous with wall thickening. The duodenum does not cross the midline consistent with a malrotation deformity. ABDOMINAL WALL: Mild body wall edema noted. BONES: No abnormal enhancement identified within the visualized osseous structures. No acute abnormality. LUNGS/PLEURAL SPACES: Interval increase in volume of right pleural effusion, which is now moderate in volume, with overlying atelectasis or consolidation. IMPRESSION: 1. Progressive complex fluid accumulation within the right hemiabdomen with early loculation. The complex fluid shows increased T1 signal, which may be seen with underlying concentrated bile duct, blood products or proteinaceous material. A nuclear medicine HIDA scan may be helpful to exclude bile leak. 2. New gallbladder wall edema with pericholecystic fluid. No gallstones identified. 3. Persistent area of hyperemia surrounding multiple cystic areas in the periphery of segment 7 and dome of liver which may re represent multiple dilated biliary radicles in the setting of cholangitis. No discrete drainable abscess or mass noted. 4. Interval increase in  volume of right pleural effusion, now moderate in volume, with overlying atelectasis or consolidation. Electronically signed by: Waddell Calk MD 10/22/2024 12:00 PM EST RP Workstation: [id number]   CT LIVER MASS BIOPSY Result Date: 10/17/2024 INDICATION: The patient has a right lobe liver mass which is difficult to see ultrasound as well as CT without IV contrast. Planned CT-guided biopsy. EXAM: CT-guided biopsy TECHNIQUE: Multidetector CT imaging of the abdomen was performed following the standard protocol without IV contrast. RADIATION DOSE REDUCTION: This exam was performed according to the departmental dose-optimization program which includes automated exposure control, adjustment of the mA and/or kV according to patient size and/or use of iterative reconstruction technique. MEDICATIONS: None. ANESTHESIA/SEDATION: Moderate (conscious) sedation was employed during this procedure. A total of Versed  0.5 mg and Fentanyl  25 mcg was administered intravenously by the radiology nurse. Total intra-service moderate Sedation Time: 30 minutes. The patient's level of consciousness and vital signs were monitored continuously by radiology nursing throughout the procedure under my direct supervision. COMPLICATIONS: None immediate. PROCEDURE: Informed written consent was obtained from the patient after a thorough discussion of the procedural risks, benefits and  alternatives. All questions were addressed. Maximal Sterile Barrier Technique was utilized including caps, mask, sterile gowns, sterile gloves, sterile drape, hand hygiene and skin antiseptic. A timeout was performed prior to the initiation of the procedure. With the patient in a right anterior oblique position axial images of the liver were obtained. The liver was evaluated and measurements were created. Radiopaque markers were then placed on the patient's right upper quadrant and axial imaging was performed in order to further evaluate percutaneous access. The  patient's skin was then marked, prepped, and draped in usual sterile fashion. Local anesthesia was achieved with 1% lidocaine . A small incision was made in the right upper quadrant and an access needle was then advanced through the incision into the liver capsule. Repeat imaging demonstrated needle position in satisfactory position. The stylet was removed and 2 core needle biopsies were then performed of the suspected lesion in the right upper quadrant lateral aspect of the right lobe. All needles were then removed from the patient sterile dressing was applied. Samples were sent to pathology for further evaluation. IMPRESSION: Satisfactory needle core biopsy under CT guidance of a right lobe liver mass. Electronically Signed   By: Cordella Banner   On: 10/17/2024 10:01   MR 3D Recon At Scanner Result Date: 10/17/2024 CLINICAL DATA:  Pancreatitis.  Liver lesions. EXAM: MRI ABDOMEN WITHOUT AND WITH CONTRAST (INCLUDING MRCP) TECHNIQUE: Multiplanar multisequence MR imaging of the abdomen was performed both before and after the administration of intravenous contrast. Heavily T2-weighted images of the biliary and pancreatic ducts were obtained, and three-dimensional MRCP images were rendered by post processing. CONTRAST:  5mL GADAVIST  GADOBUTROL  1 MMOL/ML IV SOLN COMPARISON:  CT scan and abdominal ultrasound from 1 day prior. FINDINGS: Lower chest: Right pleural effusion. Hepatobiliary: Peripheral multicystic enhancing lesions are identified in the liver. 3.6 x 3.0 cm lesion in the hepatic dome seen on image 15/1001. Lateral right hepatic lobe lesion seen on 27/1 1,001 measures 6.3 x 3.8 cm. Mild periportal edema. No substantial intrahepatic biliary duct dilatation. Common duct measures 7 mm in the hepatoduodenal ligament with common bile duct measuring on the order of 4 mm in the pancreatic head. MRCP imaging is markedly motion degraded with no definite choledocholithiasis. Pancreas: No main duct dilatation. No  focal pancreatic mass lesion. Pancreas does not appear frankly edematous by MRI. Spleen:  No splenomegaly. No suspicious focal mass lesion. Adrenals/Urinary Tract: No adrenal nodule or mass. Right kidney unremarkable. Multiple simple cysts are identified in the left kidney, lower pole predominant. Stomach/Bowel: Stomach is unremarkable. No gastric wall thickening. No evidence of outlet obstruction. Duodenum appears edematous with wall thickening. As noted on previous CT, the duodenum does not cross the midline as expected, tracking down towards the pelvis anterior to the IVC. No obvious twisting or swirling of the duodenum and stomach is nondilated. The edema/inflammation in the right upper quadrant along the duodenum, gallbladder fossa, and hepatic flexure is progressive in the interval since the prior CT. Vascular/Lymphatic: Atherosclerotic disease characterizes the abdominal aorta. IVC is collapsed but appears patent. This may be related to hypovolemia or mass-effect from the edema/inflammation in the right upper quadrant and right retroperitoneal tissues. Main portal vein and bifurcation into the left and right portal vein patent. Splenic vein is diminutive but appears to be patent. There is mass-effect on the portal splenic confluence but the SMV does opacified. Other: Extensive edema is identified in the retroperitoneal anatomy of the right abdomen, extending down towards the pelvis and involving the musculature of  the lateral right abdominal wall. Musculoskeletal: No focal suspicious marrow enhancement within the visualized bony anatomy. IMPRESSION: 1. Peripheral multicystic enhancing lesions in the liver. Imaging features raise concern for intrahepatic abscesses. Metastatic disease or primary hepatic neoplasm not excluded. 2. Edema/inflammation in the right upper quadrant along the duodenum, gallbladder fossa, and hepatic flexure is progressive in the interval since the prior CT. Increasing right pleural  effusion since prior CT. 3. Duodenum does not cross the midline as expected and appears edematous with wall thickening. No obvious twisting or swirling of the duodenum and stomach is nondilated. Imaging features compatible with component of malrotation although the SMA/SMV orientation appears appropriate 4. Gallbladder is distended with pericholecystic fluid. No definite cholelithiasis. 5. IVC is collapsed but appears patent. This may be related to hypovolemia or mass-effect from the edema/inflammation in the right upper quadrant/right retroperitoneal tissues. 6. No substantial intrahepatic biliary duct dilatation. Common duct measures 7 mm in the hepatoduodenal ligament with common bile duct measuring on the order of 4 mm in the pancreatic head. MRCP imaging is markedly motion degraded with no definite choledocholithiasis. 7. No main pancreatic duct dilatation. No focal pancreatic mass lesion. Pancreas does not appear overtly edematous by MRI. These results will be called to the ordering clinician or representative by the Radiologist Assistant, and communication documented in the PACS or [employer]. Electronically Signed   By: Camellia Candle M.D.   On: 10/17/2024 08:09   MR ABDOMEN MRCP W WO CONTAST Result Date: 10/15/2024 CLINICAL DATA:  Pancreatitis.  Liver lesions. EXAM: MRI ABDOMEN WITHOUT AND WITH CONTRAST (INCLUDING MRCP) TECHNIQUE: Multiplanar multisequence MR imaging of the abdomen was performed both before and after the administration of intravenous contrast. Heavily T2-weighted images of the biliary and pancreatic ducts were obtained, and three-dimensional MRCP images were rendered by post processing. CONTRAST:  5mL GADAVIST  GADOBUTROL  1 MMOL/ML IV SOLN COMPARISON:  CT scan and abdominal ultrasound from 1 day prior. FINDINGS: Lower chest: Right pleural effusion. Hepatobiliary: Peripheral multicystic enhancing lesions are identified in the liver. 3.6 x 3.0 cm lesion in the hepatic dome seen on  image 15/1001. Lateral right hepatic lobe lesion seen on 27/1 1,001 measures 6.3 x 3.8 cm. Mild periportal edema. No substantial intrahepatic biliary duct dilatation. Common duct measures 7 mm in the hepatoduodenal ligament with common bile duct measuring on the order of 4 mm in the pancreatic head. MRCP imaging is markedly motion degraded with no definite choledocholithiasis. Pancreas: No main duct dilatation. No focal pancreatic mass lesion. Pancreas does not appear frankly edematous by MRI. Spleen:  No splenomegaly. No suspicious focal mass lesion. Adrenals/Urinary Tract: No adrenal nodule or mass. Right kidney unremarkable. Multiple simple cysts are identified in the left kidney, lower pole predominant. Stomach/Bowel: Stomach is unremarkable. No gastric wall thickening. No evidence of outlet obstruction. Duodenum appears edematous with wall thickening. As noted on previous CT, the duodenum does not cross the midline as expected, tracking down towards the pelvis anterior to the IVC. No obvious twisting or swirling of the duodenum and stomach is nondilated. The edema/inflammation in the right upper quadrant along the duodenum, gallbladder fossa, and hepatic flexure is progressive in the interval since the prior CT. Vascular/Lymphatic: Atherosclerotic disease characterizes the abdominal aorta. IVC is collapsed but appears patent. This may be related to hypovolemia or mass-effect from the edema/inflammation in the right upper quadrant and right retroperitoneal tissues. Main portal vein and bifurcation into the left and right portal vein patent. Splenic vein is diminutive but appears to be patent.  There is mass-effect on the portal splenic confluence but the SMV does opacified. Other: Extensive edema is identified in the retroperitoneal anatomy of the right abdomen, extending down towards the pelvis and involving the musculature of the lateral right abdominal wall. Musculoskeletal: No focal suspicious marrow  enhancement within the visualized bony anatomy. IMPRESSION: 1. Peripheral multicystic enhancing lesions in the liver. Imaging features raise concern for intrahepatic abscesses. Metastatic disease or primary hepatic neoplasm not excluded. 2. Edema/inflammation in the right upper quadrant along the duodenum, gallbladder fossa, and hepatic flexure is progressive in the interval since the prior CT. Increasing right pleural effusion since prior CT. 3. Duodenum does not cross the midline as expected and appears edematous with wall thickening. No obvious twisting or swirling of the duodenum and stomach is nondilated. Imaging features compatible with component of malrotation although the SMA/SMV orientation appears appropriate 4. Gallbladder is distended with pericholecystic fluid. No definite cholelithiasis. 5. IVC is collapsed but appears patent. This may be related to hypovolemia or mass-effect from the edema/inflammation in the right upper quadrant/right retroperitoneal tissues. 6. No substantial intrahepatic biliary duct dilatation. Common duct measures 7 mm in the hepatoduodenal ligament with common bile duct measuring on the order of 4 mm in the pancreatic head. MRCP imaging is markedly motion degraded with no definite choledocholithiasis. 7. No main pancreatic duct dilatation. No focal pancreatic mass lesion. Pancreas does not appear overtly edematous by MRI. These results will be called to the ordering clinician or representative by the Radiologist Assistant, and communication documented in the PACS or [employer]. Electronically Signed   By: Camellia Candle M.D.   On: 10/15/2024 05:50   US  Abdomen Limited RUQ (LIVER/GB) Result Date: 10/13/2024 CLINICAL DATA:  Right upper quadrant abdominal pain. EXAM: ULTRASOUND ABDOMEN LIMITED RIGHT UPPER QUADRANT COMPARISON:  None Available. FINDINGS: Gallbladder: Gallbladder wall is upper normal to borderline thickened at 3.1 mm. No echogenic gallstones evident.  Pericholecystic fluid evident. Sonographer reports no sonographic Murphy sign. Common bile duct: Diameter: 8 mm Liver: Liver lesion seen on CT earlier today not readily evident by ultrasound. Liver appears heterogeneous and echogenic. Portal vein is patent on color Doppler imaging with normal direction of blood flow towards the liver. Other: None. IMPRESSION: 1. Gallbladder wall is upper normal to borderline thickened at 3.1 mm. No echogenic gallstones evident and no sonographic Murphy sign. As on recent CT, pericholecystic fluid noted. Cholecystitis is not excluded. Correlation for signs/symptoms of cholecystitis recommended. Nuclear scintigraphy may prove helpful to further evaluate as clinically warranted. 2. Common bile duct is dilated at 8 mm. Correlation with liver function test recommended. 3. Liver appears heterogeneous and echogenic. This is a nonspecific finding but can be seen in the setting of hepatic steatosis. 4. Liver lesions seen on CT earlier today not readily evident by ultrasound. Electronically Signed   By: Camellia Candle M.D.   On: 10/13/2024 07:27   CT ABDOMEN PELVIS WO CONTRAST Result Date: 10/13/2024 CLINICAL DATA:  Abdominal pain with nausea. EXAM: CT ABDOMEN AND PELVIS WITHOUT CONTRAST TECHNIQUE: Multidetector CT imaging of the abdomen and pelvis was performed following the standard protocol without IV contrast. RADIATION DOSE REDUCTION: This exam was performed according to the departmental dose-optimization program which includes automated exposure control, adjustment of the mA and/or kV according to patient size and/or use of iterative reconstruction technique. COMPARISON:  None Available. FINDINGS: Lower chest: Calcified granuloma noted left lower lobe. Hepatobiliary: Small volume perihepatic free fluid with findings suspicious for periportal edema although assessment is limited by lack  of intravenous contrast material. 4.8 x 3.1 cm subtle lesion identified right liver on image 15 of  series 3. Additional smaller lesions seen towards the dome of the liver measuring up to 2.1 cm on image 9 of series 3. Gallbladder is distended with features suggesting gallbladder wall thickening. No calcified gallstones evident. There is edema/fluid in the region the gallbladder fossa and hepatoduodenal ligament. Although assessment limited by lack of intravenous contrast material, no gross dilatation of the common bile duct. Pancreas: No definite pancreatic mass.  No main duct dilatation. Spleen: No splenomegaly. No suspicious focal mass lesion. Adrenals/Urinary Tract: No adrenal nodule or mass. Right kidney unremarkable. Low-density lesions in the left kidney approach water density and are likely cysts. No evidence for hydroureter. Bladder unremarkable. Stomach/Bowel: Stomach is unremarkable. No gastric wall thickening. No evidence of outlet obstruction. Duodenum passes through the area of edema/inflammatory change of the right upper quadrant. Duodenum does not cross the midline as expected suggesting a component of small bowel malrotation. No small bowel wall thickening. No small bowel dilatation. Despite the course of the duodenum, the cecum is positioned in the right lower quadrant. The terminal ileum is normal. The appendix is normal. There is some fluid adjacent to the distal ascending colon and hepatic flexure. Diverticular disease is noted in the left colon without diverticulitis. Vascular/Lymphatic: There is advanced atherosclerotic calcification of the abdominal aorta without aneurysm. No abdominal lymphadenopathy. No pelvic lymphadenopathy. Reproductive: Uterine fibroids evident.  There is no adnexal mass. Other: No substantial free fluid in the pelvis. Musculoskeletal: No worrisome lytic or sclerotic osseous abnormality. IMPRESSION: 1. 4.8 x 3.1 cm subtle lesion identified right liver with additional smaller lesions seen towards the dome of the liver measuring up to 2.1 cm. Imaging features are  indeterminate. MRI of the abdomen with and without contrast recommended to further evaluate as metastatic disease or primary hepatic neoplasm not excluded. 2. Gallbladder is distended with features suggesting ill-defined gallbladder wall thickening on this noncontrast study. No calcified gallstones evident. There is edema/fluid in the region the gallbladder fossa and hepatoduodenal ligament. Imaging features are nonspecific but could be seen in the setting of acute cholecystitis. Right upper quadrant ultrasound may prove helpful to further evaluate. 3. Edema/inflammation is identified along the distal ascending colon and hepatic flexure, potentially secondary to changes described above in the gallbladder fossa. No associated colonic wall thickening to suggest colon represents etiology of these findings, but colitis is not excluded. 4. Small volume perihepatic free fluid with question of periportal edema. 5. Left colonic diverticulosis without diverticulitis. 6. Uterine fibroids. 7.  Aortic Atherosclerosis (ICD10-I70.0). Electronically Signed   By: Camellia Candle M.D.   On: 10/13/2024 06:41   DG Chest Port 1 View Result Date: 10/13/2024 CLINICAL DATA:  Possible sepsis. EXAM: PORTABLE CHEST 1 VIEW COMPARISON:  None Available. FINDINGS: The lungs are clear without focal pneumonia, edema, pneumothorax or pleural effusion. Biapical pleuroparenchymal scarring evident. Cardiopericardial silhouette is at upper limits of normal for size. No acute bony abnormality. Telemetry leads overlie the chest. IMPRESSION: No active disease. Electronically Signed   By: Camellia Candle M.D.   On: 10/13/2024 05:46      Subjective:  She denies any complaints this morning, nausea, no abdominal pain, no diarrhea, she has a good appetite, eager to go home  Discharge plan was discussed with daughter by phone in details, all her questions were answered   Discharge Exam: Vitals:   10/31/24 0759 10/31/24 1208  BP: (!) 145/46 (!)  137/44  Pulse:  61 60  Resp: 13 18  Temp: 98.5 F (36.9 C) 97.7 F (36.5 C)  SpO2: 100%    Vitals:   10/31/24 0400 10/31/24 0500 10/31/24 0759 10/31/24 1208  BP: (!) 144/39  (!) 145/46 (!) 137/44  Pulse:  62 61 60  Resp:  15 13 18   Temp:   98.5 F (36.9 C) 97.7 F (36.5 C)  TempSrc:   Oral Oral  SpO2:  98% 100%   Weight:      Height:        General: Pt is alert, awake, not in acute distress Cardiovascular: RRR, S1/S2 +, no rubs, no gallops Respiratory: CTA bilaterally, no wheezing, no rhonchi Abdominal: Soft, NT, ND, bowel sounds +, right upper quadrant drain present with small amount of sanguinous output Extremities: no edema, no cyanosis    The results of significant diagnostics from this hospitalization (including imaging, microbiology, ancillary and laboratory) are listed below for reference.     Microbiology: Recent Results (from the past 240 hours)  Aerobic/Anaerobic Culture w Gram Stain (surgical/deep wound)     Status: None   Collection Time: 10/25/24  4:26 PM   Specimen: Abscess  Result Value Ref Range Status   Specimen Description ABSCESS  Final   Special Requests ABDOMEN  Final   Gram Stain NO WBC SEEN NO ORGANISMS SEEN   Final   Culture   Final    No growth aerobically or anaerobically. Performed at [facility], [address], [place], [place] [address]    Report Status 10/30/2024 FINAL  Final  Body fluid culture w Gram Stain     Status: None   Collection Time: 10/25/24 10:33 PM   Specimen: Path fluid; Body Fluid  Result Value Ref Range Status   Specimen Description FLUID  Final   Special Requests PERITONEAL  Final   Gram Stain NO WBC SEEN NO ORGANISMS SEEN   Final   Culture   Final    NO GROWTH 3 DAYS Performed at [facility], [address] [place], [place] [address]    Report Status 10/29/2024 FINAL  Final  Body fluid culture w Gram Stain     Status: None (Preliminary result)   Collection Time: 10/28/24  1:13 PM    Specimen: Lung, Right; Pleural Fluid  Result Value Ref Range Status   Specimen Description PLEURAL  Final   Special Requests lung right  Final   Gram Stain   Final    RARE WBC PRESENT, PREDOMINANTLY PMN NO ORGANISMS SEEN    Culture   Final    NO GROWTH 3 DAYS Performed at [facility], [address] [place], [place] [address]    Report Status PENDING  Incomplete     Labs: BNP (last 3 results) No results for input(s): BNP in the last 8760 hours. Basic Metabolic Panel: Recent Labs  Lab 10/25/24 0020 10/26/24 0245 10/27/24 0333 10/29/24 0501 10/30/24 0343  NA 136 138 139 140 141  K 3.3* 4.2 4.1 3.6 3.7  CL 103 105 108 108 108  CO2 20* 23 23 20* 21*  GLUCOSE 103* 95 88 80 84  BUN 6* <5* 5* <5* 6*  CREATININE 1.23* 1.24* 1.36* 1.40* 1.20*  CALCIUM  8.9 9.3 9.1 9.1 9.2  MG 2.2 1.9  --   --   --   PHOS  --   --   --  2.5  --    Liver Function Tests: Recent Labs  Lab 10/25/24 0020  10/26/24 0245 10/27/24 0333 10/28/24 1354 10/29/24 0501 10/30/24 0343  AST 32 31 35  --  38 49*  ALT 22 22 20   --  23 27  ALKPHOS 54 54 50  --  51 56  BILITOT 1.1 0.9 0.6  --  0.9 0.8  PROT 5.3* 5.3* 5.5* 7.1 5.4* 5.8*  ALBUMIN 2.0* 2.0* 2.1*  --  2.2* 2.4*   No results for input(s): LIPASE, AMYLASE in the last 168 hours. No results for input(s): AMMONIA in the last 168 hours. CBC: Recent Labs  Lab 10/25/24 0020 10/26/24 0245 10/27/24 0333 10/31/24 0739  WBC 9.3 6.7 6.4 4.7  NEUTROABS 7.9* 5.1 4.4  --   HGB 9.4* 9.6* 9.5* 10.9*  HCT 29.4* 29.4* 29.2* 34.5*  MCV 83.3 82.4 83.2 84.8  PLT 267 307 373 425*   Cardiac Enzymes: No results for input(s): CKTOTAL, CKMB, CKMBINDEX, TROPONINI in the last 168 hours. BNP: Invalid input(s): POCBNP CBG: No results for input(s): GLUCAP in the last 168 hours. D-Dimer No results for input(s): DDIMER in the last 72 hours. Hgb A1c No results for input(s): HGBA1C in the last 72 hours. Lipid Profile No results  for input(s): CHOL, HDL, LDLCALC, TRIG, CHOLHDL, LDLDIRECT in the last 72 hours. Thyroid  function studies No results for input(s): TSH, T4TOTAL, T3FREE, THYROIDAB in the last 72 hours.  Invalid input(s): FREET3 Anemia work up No results for input(s): VITAMINB12, FOLATE, FERRITIN, TIBC, IRON, RETICCTPCT in the last 72 hours. Urinalysis    Component Value Date/Time   COLORURINE YELLOW 10/13/2024 0616   APPEARANCEUR CLOUDY (A) 10/13/2024 0616   LABSPEC 1.010 10/13/2024 0616   PHURINE 6.0 10/13/2024 0616   GLUCOSEU 50 (A) 10/13/2024 0616   GLUCOSEU NEGATIVE 11/19/2021 1009   HGBUR SMALL (A) 10/13/2024 0616   BILIRUBINUR NEGATIVE 10/13/2024 0616   KETONESUR NEGATIVE 10/13/2024 0616   PROTEINUR NEGATIVE 10/13/2024 0616   UROBILINOGEN 0.2 11/19/2021 1009   NITRITE NEGATIVE 10/13/2024 0616   LEUKOCYTESUR LARGE (A) 10/13/2024 0616   Sepsis Labs Recent Labs  Lab 10/25/24 0020 10/26/24 0245 10/27/24 0333 10/31/24 0739  WBC 9.3 6.7 6.4 4.7   Microbiology Recent Results (from the past 240 hours)  Aerobic/Anaerobic Culture w Gram Stain (surgical/deep wound)     Status: None   Collection Time: 10/25/24  4:26 PM   Specimen: Abscess  Result Value Ref Range Status   Specimen Description ABSCESS  Final   Special Requests ABDOMEN  Final   Gram Stain NO WBC SEEN NO ORGANISMS SEEN   Final   Culture   Final    No growth aerobically or anaerobically. Performed at [facility], [address] [place], [place] [address]    Report Status 10/30/2024 FINAL  Final  Body fluid culture w Gram Stain     Status: None   Collection Time: 10/25/24 10:33 PM   Specimen: Path fluid; Body Fluid  Result Value Ref Range Status   Specimen Description FLUID  Final   Special Requests PERITONEAL  Final   Gram Stain NO WBC SEEN NO ORGANISMS SEEN   Final   Culture   Final    NO GROWTH 3 DAYS Performed at [facility], [address], [place], [place]  [address]    Report Status 10/29/2024 FINAL  Final  Body fluid culture w Gram Stain     Status: None (Preliminary result)   Collection Time: 10/28/24  1:13 PM   Specimen: Lung, Right; Pleural Fluid  Result Value Ref Range  Status   Specimen Description PLEURAL  Final   Special Requests lung right  Final   Gram Stain   Final    RARE WBC PRESENT, PREDOMINANTLY PMN NO ORGANISMS SEEN    Culture   Final    NO GROWTH 3 DAYS Performed at [facility], [address] [place], [place] [address]    Report Status PENDING  Incomplete     Time coordinating discharge: Over 30 minutes  SIGNED:   Brayton Lye, MD  [facility] 10/31/2024, 3:27 PM Pager   If 7PM-7AM, please contact night-coverage [email or web address] Password [email or web address]

## 2024-10-31 NOTE — Plan of Care (Signed)
   Problem: Education: Goal: Knowledge of General Education information will improve Description: Including pain rating scale, medication(s)/side effects and non-pharmacologic comfort measures Outcome: Progressing   Problem: Activity: Goal: Risk for activity intolerance will decrease Outcome: Progressing   Problem: Coping: Goal: Level of anxiety will decrease Outcome: Progressing

## 2024-11-01 ENCOUNTER — Telehealth: Payer: Self-pay

## 2024-11-01 ENCOUNTER — Other Ambulatory Visit: Payer: Self-pay | Admitting: Surgery

## 2024-11-01 DIAGNOSIS — K651 Peritoneal abscess: Secondary | ICD-10-CM

## 2024-11-01 LAB — BODY FLUID CULTURE W GRAM STAIN: Culture: NO GROWTH

## 2024-11-01 NOTE — Transitions of Care (Post Inpatient/ED Visit) (Unsigned)
   11/01/2024  Name: Haley Randall MRN: [id number] DOB: [birth year]  Today's TOC FU Call Status: Today's TOC FU Call Status:: Unsuccessful Call (1st Attempt) Unsuccessful Call (1st Attempt) Date: 11/01/24  Attempted to reach the patient regarding the most recent Inpatient/ED visit.  Follow Up Plan: Additional outreach attempts will be made to reach the patient to complete the Transitions of Care (Post Inpatient/ED visit) call.   Signature Julian Lemmings, LPN [facility] Nurse Health Advisor Direct Dial [phone]

## 2024-11-02 ENCOUNTER — Ambulatory Visit: Payer: Self-pay | Admitting: Family

## 2024-11-02 LAB — TOTAL BILIRUBIN, BODY FLUID: Total bilirubin, fluid: 0.8 mg/dL

## 2024-11-02 LAB — AMYLASE, BODY FLUID (OTHER): Amylase, Body Fluid: 83 U/L

## 2024-11-02 NOTE — Transitions of Care (Post Inpatient/ED Visit) (Unsigned)
   11/02/2024  Name: Haley Randall MRN: [id number] DOB: [birth year]  Today's TOC FU Call Status: Today's TOC FU Call Status:: Unsuccessful Call (2nd Attempt) Unsuccessful Call (1st Attempt) Date: 11/01/24 Unsuccessful Call (2nd Attempt) Date: 11/02/24  Attempted to reach the patient regarding the most recent Inpatient/ED visit.  Follow Up Plan: Additional outreach attempts will be made to reach the patient to complete the Transitions of Care (Post Inpatient/ED visit) call.   Signature Julian Lemmings, LPN [facility] Nurse Health Advisor Direct Dial [phone]

## 2024-11-02 NOTE — Progress Notes (Signed)
 noted

## 2024-11-03 NOTE — Transitions of Care (Post Inpatient/ED Visit) (Signed)
 11/03/2024  Name: Haley Randall MRN: [id number] DOB: 04-12-1936  Today's TOC FU Call Status: Today's TOC FU Call Status:: Successful TOC FU Call Completed Unsuccessful Call (1st Attempt) Date: 11/01/24 Unsuccessful Call (2nd Attempt) Date: 11/02/24 [facility] FU Call Complete Date: 11/03/24  Patient's Name and Date of Birth confirmed. Name, DOB  Transition Care Management Follow-up Telephone Call Date of Discharge: 10/31/24 Discharge Facility: [facility]) Type of Discharge: Inpatient Admission Primary Inpatient Discharge Diagnosis:: abd pain How have you been since you were released from the hospital?: Better Any questions or concerns?: No  Items Reviewed: Did you receive and understand the discharge instructions provided?: Yes Medications obtained,verified, and reconciled?: Yes (Medications Reviewed) Any new allergies since your discharge?: No Dietary orders reviewed?: Yes  Medications Reviewed Today: Medications Reviewed Today     Reviewed by Emmitt Pan, LPN (Licensed Practical Nurse) on 11/03/24 at 1029  Med List Status: <None>   Medication Order Taking? Sig Documenting Provider Last Dose Status Informant  acetaminophen  (TYLENOL ) 325 MG tablet [id number] Yes Take 2 tablets (650 mg total) by mouth every 6 (six) hours as needed for moderate pain (pain score 4-6) (headache). Elgergawy, Brayton RAMAN, MD  Active   amLODipine  (NORVASC ) 10 MG tablet [id number] Yes Take 1 tablet (10 mg total) by mouth daily. Elgergawy, Brayton RAMAN, MD  Active   ferrous sulfate  325 (65 FE) MG tablet [id number] Yes Take 1 tablet (325 mg total) by mouth daily with breakfast. Elgergawy, Brayton RAMAN, MD  Active   folic acid  (FOLVITE ) 1 MG tablet [id number] Yes Take 1 tablet (1 mg total) by mouth daily. Elgergawy, Brayton RAMAN, MD  Active   metoprolol  tartrate (LOPRESSOR ) 25 MG tablet [id number] Yes Take 0.5 tablets (12.5 mg total) by mouth 2 (two) times daily. Elgergawy, Brayton RAMAN, MD  Active   Multiple Vitamin  (MULTIVITAMIN WITH MINERALS) TABS tablet [id number] Yes Take 1 tablet by mouth daily. Elgergawy, Brayton RAMAN, MD  Active   pantoprazole  (PROTONIX ) 40 MG tablet [id number] Yes Take 1 tablet (40 mg total) by mouth 2 (two) times daily. Elgergawy, Brayton RAMAN, MD  Active   sodium chloride  flush (NS) 0.9 % SOLN [id number] Yes Flush your abd drain with 5 mL of NS from the prefilled syringe once daily. Do not forget to charge the bulb again after administering this. Huneycutt, Brittany, NP  Active             Home Care and Equipment/Supplies: Were Home Health Services Ordered?: Yes Name of Home Health Agency:: [facility] Has Agency set up a time to come to your home?: Yes First Home Health Visit Date: 11/02/24 Any new equipment or medical supplies ordered?: Yes Name of Medical supply agency?: unknown Were you able to get the equipment/medical supplies?: Yes Do you have any questions related to the use of the equipment/supplies?: No  Functional Questionnaire: Do you need assistance with bathing/showering or dressing?: Yes Do you need assistance with meal preparation?: No Do you need assistance with eating?: No Do you have difficulty maintaining continence: No Do you need assistance with getting out of bed/getting out of a chair/moving?: No Do you have difficulty managing or taking your medications?: No  Follow up appointments reviewed: PCP Follow-up appointment confirmed?: Yes Date of PCP follow-up appointment?: 11/08/24 Follow-up Provider: [facility] Follow-up appointment confirmed?: Yes Date of Specialist follow-up appointment?: 11/10/24 Follow-Up Specialty Provider:: IR Do you need transportation to your follow-up appointment?: No Do you understand care options if your condition(s) worsen?: Yes-patient verbalized understanding  SIGNATURE Julian Lemmings, LPN [facility] Nurse Health Advisor Direct Dial [phone]

## 2024-11-04 ENCOUNTER — Telehealth: Payer: Self-pay | Admitting: *Deleted

## 2024-11-04 LAB — CYTOLOGY - NON PAP

## 2024-11-04 NOTE — Telephone Encounter (Signed)
 Okay for verbal orders.

## 2024-11-04 NOTE — Telephone Encounter (Signed)
 Copied from CRM [id number]. Topic: Clinical - Home Health Verbal Orders >> Nov 04, 2024 10:34 AM Alfonso ORN wrote: Caller/Agency: [facility]  Callback Number: [phone] Service Requested: Skilled Nursing Frequency: one week 6  Any new concerns about the patient?no, medication in relation to biliary drain

## 2024-11-04 NOTE — Telephone Encounter (Signed)
 Verbal orders have been given to Haley Randall with [facility].

## 2024-11-08 ENCOUNTER — Inpatient Hospital Stay: Payer: PRIVATE HEALTH INSURANCE | Admitting: Family

## 2024-11-08 ENCOUNTER — Telehealth: Payer: Self-pay | Admitting: Family

## 2024-11-08 NOTE — Telephone Encounter (Signed)
 Attempted to contact the pt's daughter back. Call went straight to VM. LM to return my call.   Can we order a walker without seeing the pt since 2023?

## 2024-11-08 NOTE — Telephone Encounter (Unsigned)
 Copied from CRM #[id number]. Topic: General - Other >> Nov 08, 2024 10:28 AM Thersia BROCKS wrote: Reason for CRM: Patient daughter called in regarding missed call from Manuelita , relay message that walker can be ordered on appointment on 12/12 Patient Daughter stated she needs it order now before then, would like for Manuelita to give her a callback

## 2024-11-08 NOTE — Telephone Encounter (Signed)
 Unfortunately this requires the office visit for documentation and insurance to potentially cover.

## 2024-11-08 NOTE — Telephone Encounter (Signed)
 Spoke with pt's daughter, [name]. She is aware that we will have to wait until the pt's appointment on Friday to order this walker. She verbalized understanding.

## 2024-11-08 NOTE — Telephone Encounter (Signed)
 Daughter stopped by regarding the walker that was suppose to be ordered for patient. Please call daughter for further information.

## 2024-11-08 NOTE — Telephone Encounter (Signed)
 We have not seen the pt since 2023. She has a pending OV on Friday, 11/11/24. A walker can be ordered at this appointment.  LM for pt's daugher, [name] to return my call. Please provide message from provider/office when call is returned from patient.

## 2024-11-10 ENCOUNTER — Inpatient Hospital Stay: Admission: RE | Admit: 2024-11-10 | Discharge: 2024-11-10 | Payer: PRIVATE HEALTH INSURANCE

## 2024-11-10 ENCOUNTER — Other Ambulatory Visit: Payer: Self-pay | Admitting: Surgery

## 2024-11-10 ENCOUNTER — Other Ambulatory Visit: Payer: Self-pay | Admitting: Interventional Radiology

## 2024-11-10 ENCOUNTER — Inpatient Hospital Stay: Admission: RE | Admit: 2024-11-10 | Discharge: 2024-11-10 | Payer: PRIVATE HEALTH INSURANCE | Attending: Surgery

## 2024-11-10 DIAGNOSIS — K651 Peritoneal abscess: Secondary | ICD-10-CM | POA: Diagnosis not present

## 2024-11-10 DIAGNOSIS — K863 Pseudocyst of pancreas: Secondary | ICD-10-CM

## 2024-11-10 DIAGNOSIS — Z4803 Encounter for change or removal of drains: Secondary | ICD-10-CM | POA: Diagnosis not present

## 2024-11-10 HISTORY — PX: IR RADIOLOGIST EVAL & MGMT: IMG5224

## 2024-11-10 HISTORY — PX: IR CATHETER TUBE CHANGE: IMG717

## 2024-11-10 NOTE — Progress Notes (Signed)
 Chief Complaint: Patient was seen in consultation today for pancreatic pseudocyst with drain in place at the request of Huneycutt,Brittany  Referring Physician(s): Huneycutt,Brittany  History of Present Illness: Haley Randall is a 88 y.o. female  with a history of pancreatitis and complex pancreatic pseudocyst extending from the region of the porta hepatis down the right paracolic gutter and into the anatomic pelvis.  There was concern for possible infection and so a percutaneous drain was placed on the 10/25/2024.  All fluid cultures have been negative.  Patient presents today for follow-up.  She has minimal output from the drainage catheter however CT imaging demonstrates a persistent large fluid collection along the right paracolic gutter.  Patient is a poor historian, her sister performs most of her medical care and is not present.  She states she is feeling well and does not have any pain or fever.  Past Medical History:  Diagnosis Date   Chest pain    Dizziness    Hyperlipidemia    Hypertension    Idiopathic gout of foot 08/30/2020    Past Surgical History:  Procedure Laterality Date   BREAST SURGERY Left 1980   biopsy   IR RADIOLOGIST EVAL & MGMT  11/10/2024    Allergies: Lisinopril   Medications: Prior to Admission medications  Medication Sig Start Date End Date Taking? Authorizing Provider  acetaminophen  (TYLENOL ) 325 MG tablet Take 2 tablets (650 mg total) by mouth every 6 (six) hours as needed for moderate pain (pain score 4-6) (headache). 10/31/24   Elgergawy, Brayton RAMAN, MD  amLODipine  (NORVASC ) 10 MG tablet Take 1 tablet (10 mg total) by mouth daily. 11/01/24 12/01/24  Elgergawy, Brayton RAMAN, MD  ferrous sulfate  325 (65 FE) MG tablet Take 1 tablet (325 mg total) by mouth daily with breakfast. 11/01/24   Elgergawy, Brayton RAMAN, MD  folic acid  (FOLVITE ) 1 MG tablet Take 1 tablet (1 mg total) by mouth daily. 11/01/24   Elgergawy, Brayton RAMAN, MD  metoprolol  tartrate  (LOPRESSOR ) 25 MG tablet Take 0.5 tablets (12.5 mg total) by mouth 2 (two) times daily. 10/31/24 11/30/24  Elgergawy, Brayton RAMAN, MD  Multiple Vitamin (MULTIVITAMIN WITH MINERALS) TABS tablet Take 1 tablet by mouth daily. 11/01/24   Elgergawy, Brayton RAMAN, MD  pantoprazole  (PROTONIX ) 40 MG tablet Take 1 tablet (40 mg total) by mouth 2 (two) times daily. 10/31/24   Elgergawy, Brayton RAMAN, MD  sodium chloride  flush (NS) 0.9 % SOLN Flush your abd drain with 5 mL of NS from the prefilled syringe once daily. Do not forget to charge the bulb again after administering this. 10/31/24   Huneycutt, Brittany, NP     Family History  Problem Relation Age of Onset   Heart disease Mother    Heart disease Father    Stroke Maternal Grandmother    Stroke Maternal Grandfather    Heart disease Paternal Grandmother    Heart disease Paternal Grandfather    Cancer Neg Hx    Diabetes Neg Hx     Social History   Socioeconomic History   Marital status: Married    Spouse name: Fannie   Number of children: 1   Years of education: Not on file   Highest education level: Not on file  Occupational History   Occupation: retired  Tobacco Use   Smoking status: Former   Smokeless tobacco: Never   Tobacco comments:    quit 30 years ago  Vaping Use   Vaping status: Never Used  Substance and Sexual Activity  Alcohol use: Not Currently   Drug use: No   Sexual activity: Not Currently  Other Topics Concern   Not on file  Social History Narrative   05/23/22   From: originally from [place], then [place]    Living: with husband education officer, environmental) and grandson   Work: retired - book keeping      Family: one adopted daughter - [name]      Enjoys: nothing currently      Exercise: not currently   Diet: avoids certain foods - seafood, red meat      Safety   Seat belts: Yes    Guns: No   Safe in relationships: Yes       Social Drivers of Health   Tobacco Use: Medium Risk (10/13/2024)   Patient History    Smoking Tobacco Use: Former     Smokeless Tobacco Use: Never    Passive Exposure: Not on Actuary Strain: Low Risk (08/24/2023)   Overall Financial Resource Strain (CARDIA)    Difficulty of Paying Living Expenses: Not hard at all  Food Insecurity: No Food Insecurity (10/13/2024)   Epic    Worried About Programme Researcher, Broadcasting/film/video in the Last Year: Never true    Ran Out of Food in the Last Year: Never true  Transportation Needs: No Transportation Needs (10/13/2024)   Epic    Lack of Transportation (Medical): No    Lack of Transportation (Non-Medical): No  Physical Activity: Inactive (08/24/2023)   Exercise Vital Sign    Days of Exercise per Week: 0 days    Minutes of Exercise per Session: 0 min  Stress: No Stress Concern Present (08/24/2023)   [employer] of Occupational Health - Occupational Stress Questionnaire    Feeling of Stress : Not at all  Social Connections: Socially Isolated (10/13/2024)   Social Connection and Isolation Panel    Frequency of Communication with Friends and Family: Once a week    Frequency of Social Gatherings with Friends and Family: Once a week    Attends Religious Services: Never    Database Administrator or Organizations: No    Attends Banker Meetings: Never    Marital Status: Widowed  Depression (PHQ2-9): Low Risk (08/24/2023)   Depression (PHQ2-9)    PHQ-2 Score: 0  Alcohol Screen: Low Risk (08/24/2023)   Alcohol Screen    Last Alcohol Screening Score (AUDIT): 0  Housing: Low Risk (10/13/2024)   Epic    Unable to Pay for Housing in the Last Year: No    Number of Times Moved in the Last Year: 0    Homeless in the Last Year: No  Utilities: Not At Risk (10/13/2024)   Epic    Threatened with loss of utilities: No  Health Literacy: Adequate Health Literacy (08/24/2023)   B1300 Health Literacy    Frequency of need for help with medical instructions: Never    Review of Systems: A 12 point ROS discussed and pertinent positives are indicated in the HPI  above.  All other systems are negative.  Review of Systems  Vital Signs: There were no vitals taken for this visit.   Physical Exam Constitutional:      General: She is not in acute distress.    Appearance: Normal appearance. She is normal weight. She is not ill-appearing.  HENT:     Head: Normocephalic and atraumatic.  Eyes:     General: No scleral icterus. Cardiovascular:     Rate and Rhythm: Normal rate.  Pulmonary:     Effort: Pulmonary effort is normal.  Abdominal:     General: There is no distension.     Palpations: Abdomen is soft.     Tenderness: There is no abdominal tenderness. There is no guarding.  Skin:    General: Skin is warm and dry.  Neurological:     Mental Status: She is alert.     Imaging: CT ABDOMEN PELVIS WO CONTRAST Result Date: 11/10/2024 CLINICAL DATA:  88 year old female with a history of pancreatitis and suspected pancreatic pseudocyst extending from the porta hepatis down the right pericolic gutter into the anatomic pelvis. A percutaneous drainage catheter was placed on 10/25/2024. EXAM: CT ABDOMEN AND PELVIS WITHOUT CONTRAST TECHNIQUE: Multidetector CT imaging of the abdomen and pelvis was performed following the standard protocol without IV contrast. RADIATION DOSE REDUCTION: This exam was performed according to the departmental dose-optimization program which includes automated exposure control, adjustment of the mA and/or kV according to patient size and/or use of iterative reconstruction technique. COMPARISON:  Prior CT scan of the abdomen and pelvis 10/25/2024 FINDINGS: Lower chest: No acute abnormality. Hepatobiliary: Improving appearance of the liver with less heterogeneity. No overt cirrhotic changes. Gallbladder is unremarkable. No intra or extrahepatic biliary ductal dilatation. Pancreas: Unremarkable. No pancreatic ductal dilatation or surrounding inflammatory changes. Spleen: Normal in size without focal abnormality. Adrenals/Urinary Tract:  Adrenal glands are unremarkable. Kidneys are normal, without renal calculi, focal lesion, or hydronephrosis. Bladder is unremarkable. Stomach/Bowel: No focal bowel wall thickening or evidence of obstruction. Vascular/Lymphatic: Limited evaluation in the absence of intravenous contrast. Extensive atherosclerotic calcifications. No suspicious lymphadenopathy. Reproductive: Degenerated calcified uterine fibroids. No ovarian masses. Other: Right lower quadrant drainage catheter in good position. Persistent ill-defined fluid collection along the right pericolic gutter measuring approximately 13.3 x 4.2 x 3.5 cm. Musculoskeletal: No acute fracture or aggressive appearing lytic or blastic osseous lesion. Extensive multilevel degenerative changes throughout the lumbar spine. IMPRESSION: 1. Persistent large irregular fluid collection along the right pericolic gutter despite well-positioned drainage catheter. 2. Improving appearance of the liver with decreased heterogeneity compared to prior imaging likely reflecting resolution of the underlying infectious/inflammatory process. 3. Additional ancillary findings as above. Electronically Signed   By: Wilkie Lent M.D.   On: 11/10/2024 10:48   IR Radiologist Eval & Mgmt Result Date: 11/10/2024 EXAM: ESTABLISHED PATIENT OFFICE VISIT CHIEF COMPLAINT: SEE NOTE IN EPIC HISTORY OF PRESENT ILLNESS: SEE NOTE IN EPIC REVIEW OF SYSTEMS: SEE NOTE IN EPIC PHYSICAL EXAMINATION: SEE NOTE IN EPIC ASSESSMENT AND PLAN: SEE NOTE IN EPIC Electronically Signed   By: Wilkie Lent M.D.   On: 11/10/2024 10:31   DG Chest 1 View Result Date: 10/28/2024 EXAM: 1 VIEW XRAY OF THE CHEST 10/28/2024 01:08:00 PM COMPARISON: 10/13/2024 CLINICAL HISTORY: S/P thoracentesis FINDINGS: LUNGS AND PLEURA: No focal pulmonary opacity. No pleural effusion. No pneumothorax. HEART AND MEDIASTINUM: Atherosclerotic calcification of aortic arch. BONES AND SOFT TISSUES: No acute osseous abnormality.  IMPRESSION: 1. No acute process. Electronically signed by: Donnice Mania MD 10/28/2024 01:25 PM EST RP Workstation: [id number]   CT GUIDED PERITONEAL/RETROPERITONEAL FLUID DRAIN BY PERC CATH Result Date: 10/25/2024 INDICATION: 88 year old female with enlarging right retroperitoneal fluid collection. EXAM: CT PERC DRAIN PERITONEAL ABCESS COMPARISON:  CT abdomen pelvis from earlier the same day MEDICATIONS: The patient is currently admitted to the hospital and receiving intravenous antibiotics. The antibiotics were administered within an appropriate time frame prior to the initiation of the procedure. ANESTHESIA/SEDATION: Moderate (conscious) sedation was employed during this procedure. A total  of Versed  1 mg and Fentanyl  50 mcg was administered intravenously. Moderate Sedation Time: 14 minutes. The patient's level of consciousness and vital signs were monitored continuously by radiology nursing throughout the procedure under my direct supervision. CONTRAST:  None COMPLICATIONS: None immediate. PROCEDURE: RADIATION DOSE REDUCTION: This exam was performed according to the departmental dose-optimization program which includes automated exposure control, adjustment of the mA and/or kV according to patient size and/or use of iterative reconstruction technique. Informed written consent was obtained from the patient after a discussion of the risks, benefits and alternatives to treatment. The patient was placed supine on the CT gantry and a pre procedural CT was performed re-demonstrating the known abscess/fluid collection within the right retroperitoneum. The procedure was planned. A timeout was performed prior to the initiation of the procedure. The right lower abdominal mid axillary region was prepped and draped in the usual sterile fashion. The overlying soft tissues were anesthetized with 1% lidocaine  with epinephrine . Appropriate trajectory was planned with the use of a 22 gauge spinal needle. An 18 gauge trocar  needle was advanced into the abscess/fluid collection and a short Amplatz super stiff wire was coiled within the collection. Appropriate positioning was confirmed with a limited CT scan. The tract was serially dilated allowing placement of a 10 French all-purpose drainage catheter. Appropriate positioning was confirmed with a limited postprocedural CT scan. Approximately 100 ml of thin, brown fluid was aspirated. The tube was connected to a bulb suction and sutured in place. A dressing was placed. The patient tolerated the procedure well without immediate post procedural complication. IMPRESSION: Successful CT guided placement of a 10 French all purpose drain catheter into the right retroperitoneal fluid collection with aspiration of approximately 100 mL of thin brown fluid. Samples were sent to the laboratory as requested by the ordering clinical team. Ester Sides, MD [facility] Radiology Electronically Signed   By: Ester Sides M.D.   On: 10/25/2024 18:14   CT ABDOMEN PELVIS WO CONTRAST Result Date: 10/25/2024 EXAM: CT ABDOMEN AND PELVIS WITHOUT CONTRAST 10/25/2024 10:34:25 AM TECHNIQUE: CT of the abdomen and pelvis was performed without the administration of intravenous contrast. Multiplanar reformatted images are provided for review. Automated exposure control, iterative reconstruction, and/or weight-based adjustment of the mA/kV was utilized to reduce the radiation dose to as low as reasonably achievable. COMPARISON: MRI 10/22/2024, CT 10/13/2024. CLINICAL HISTORY: Abdominal pain, acute, nonlocalized; Intra-abdominal fluid collection nonspecific source. FINDINGS: LOWER CHEST: Moderate right pleural effusion unchanged from MRI. LIVER: Hypodensities in the right hepatic lobe again noted. GALLBLADDER AND BILE DUCTS: Gallbladder is grossly normal on noncontrast exam. No biliary ductal dilatation. SPLEEN: No acute abnormality. PANCREAS: No acute abnormality.  ADRENAL GLANDS: No acute abnormality. KIDNEYS, URETERS AND BLADDER: No stones in the kidneys or ureters. No hydronephrosis. No perinephric or periureteral stranding. Urinary bladder is unremarkable. GI AND BOWEL: Stomach demonstrates no acute abnormality. There is no bowel obstruction. PERITONEUM AND RETROPERITONEUM: Fluid collection along the right paracolic gutter extending from the inferior margin of the right hepatic lobe into the iliac fossa. Low density fluid. The dimensions are similar to comparison MRI. There is some organization noted on comparison MRI. The greatest volume in axial dimension is within the iliac fossa on the right measuring 6.7 x 8.6 cm. Small amount of fluid in the pelvis. No free air. VASCULATURE: Aorta is normal in caliber. LYMPH NODES: No lymphadenopathy. REPRODUCTIVE ORGANS: Uterus is no longer normal. BONES AND SOFT TISSUES: No acute osseous abnormality. No focal soft  tissue abnormality. IMPRESSION: 1. Persistent low density fluid collection in the right pericardial gutter similar to comparison MRI with the greatest dimension of volume within the right iliac fossa. On comparison MRI the collection appeared mildly complex and organized. No iv contrast on current exam. 2. Moderate volume right pleural effusion similar to prior. 3. Gallbladder grossly normal on noncontrast exam. Electronically signed by: Norleen Boxer MD 10/25/2024 11:17 AM EST RP Workstation: [id number]   NM Hepatobiliary Liver Func Result Date: 10/24/2024 EXAM: NM HEPATOBILLARY SCAN 10/24/2024 05:35:58 PM TECHNIQUE: RADIOPHARMACEUTICAL: 5.5 mCi Tc-74m mebrofenin  Anterior planar images of the abdomen and pelvis were obtained over approximately 1 hour. 2.3 mg Morphine  given intravenously  Dynamic imaging of the abdomen is obtained COMPARISON: MRI 10/22/2024, ultrasound 10/13/2024 CLINICAL HISTORY: Biliary colic, recurrent, gallbladder dyskinesia suspected FINDINGS: Homogenous uptake within the liver. Normal clearance  of the blood pool. Appropriate excretion into the biliary system. Activity is visualized in the small bowel. Activity is visualized in the gallbladder following morphine  administration IMPRESSION: 1. Negative for acute cholecystitis ( gallbladder visualized after morphine  administration ) 2. Normal biliary to bowel transit. Electronically signed by: Luke Bun MD 10/24/2024 06:54 PM EST RP Workstation: [id number]   MR LIVER W WO CONTRAST Result Date: 10/22/2024 EXAM: MRI Abdomen with and without Contrast 10/22/2024 11:12:14 AM TECHNIQUE: Multiplanar multisequence MRI of the abdomen was performed with and without the administration of intravenous contrast. 6 mL (gadobutrol  (GADAVIST ) 1 MMOL/ML injection 6 mL GADOBUTROL  1 MMOL/ML IV SOLN). COMPARISON: MRI 10/14/2024. CLINICAL HISTORY: FINDINGS: LIVER: Peripheral, multicystic enhancing process within segment 7. Imaging findings are favored to represent cystic dilatation of peripheral biliary radicles. No well-defined drainable abscess identified. Surrounding hyperemia on the arterial phase images noted with mural enhancing dilated biliary radicles extending up to this area noted. On the current exam, the affected area measures approximately 5.6 x 3.5 x 6.3 cm (image 27/100). On the previous exam, this area measured 6.3 x 3.8 x 6.9 cm. No new abnormalities identified within the liver. GALLBLADDER AND BILIARY SYSTEM: New gallbladder wall edema is noted with wall thickness measuring up to 6 mm with signs of pericholecystic edema/fluid. No gallstones identified. Common bile duct measures up to 6 mm. No signs of choledocholithiasis. No intrahepatic ductal dilation. SPLEEN: Normal appearance of the spleen. PANCREAS: No signs of pancreatic main duct dilatation or mass. ADRENAL GLANDS: Unremarkable. KIDNEYS: Bosniak class 1 cyst identified within the inferior pole of the left kidney measuring up to 1.6 cm (image 26/4). LYMPH NODES: No adenopathy identified. VASCULATURE:  Extensive aortic atherosclerotic calcifications. PERITONEUM: Progressive complex fluid accumulation within the right hemiabdomen extends from the gallbladder into the right iliac fossa .This collection of fluid measures approximately 8.2 x 6.9 x 20.2 cm. Overlying enhancement is noted involving the peritoneal reflection with signs suggestive of early loculation. The fluid shows mixed signal intensity but is predominantly increased on the T1 weighted sequences and increased on the T2-weighted sequences which may be seen with a biloma and/or hematoma. BOWEL: As noted previously, the duodenum appears edematous with wall thickening. The duodenum does not cross the midline consistent with a malrotation deformity. ABDOMINAL WALL: Mild body wall edema noted. BONES: No abnormal enhancement identified within the visualized osseous structures. No acute abnormality. LUNGS/PLEURAL SPACES: Interval increase in volume of right pleural effusion, which is now moderate in volume, with overlying atelectasis or consolidation. IMPRESSION: 1. Progressive complex fluid accumulation within the right hemiabdomen with early loculation. The complex fluid shows increased T1 signal, which may be seen with underlying concentrated  bile duct, blood products or proteinaceous material. A nuclear medicine HIDA scan may be helpful to exclude bile leak. 2. New gallbladder wall edema with pericholecystic fluid. No gallstones identified. 3. Persistent area of hyperemia surrounding multiple cystic areas in the periphery of segment 7 and dome of liver which may re represent multiple dilated biliary radicles in the setting of cholangitis. No discrete drainable abscess or mass noted. 4. Interval increase in volume of right pleural effusion, now moderate in volume, with overlying atelectasis or consolidation. Electronically signed by: Waddell Calk MD 10/22/2024 12:00 PM EST RP Workstation: [id number]   CT LIVER MASS BIOPSY Result Date:  10/17/2024 INDICATION: The patient has a right lobe liver mass which is difficult to see ultrasound as well as CT without IV contrast. Planned CT-guided biopsy. EXAM: CT-guided biopsy TECHNIQUE: Multidetector CT imaging of the abdomen was performed following the standard protocol without IV contrast. RADIATION DOSE REDUCTION: This exam was performed according to the departmental dose-optimization program which includes automated exposure control, adjustment of the mA and/or kV according to patient size and/or use of iterative reconstruction technique. MEDICATIONS: None. ANESTHESIA/SEDATION: Moderate (conscious) sedation was employed during this procedure. A total of Versed  0.5 mg and Fentanyl  25 mcg was administered intravenously by the radiology nurse. Total intra-service moderate Sedation Time: 30 minutes. The patient's level of consciousness and vital signs were monitored continuously by radiology nursing throughout the procedure under my direct supervision. COMPLICATIONS: None immediate. PROCEDURE: Informed written consent was obtained from the patient after a thorough discussion of the procedural risks, benefits and alternatives. All questions were addressed. Maximal Sterile Barrier Technique was utilized including caps, mask, sterile gowns, sterile gloves, sterile drape, hand hygiene and skin antiseptic. A timeout was performed prior to the initiation of the procedure. With the patient in a right anterior oblique position axial images of the liver were obtained. The liver was evaluated and measurements were created. Radiopaque markers were then placed on the patient's right upper quadrant and axial imaging was performed in order to further evaluate percutaneous access. The patient's skin was then marked, prepped, and draped in usual sterile fashion. Local anesthesia was achieved with 1% lidocaine . A small incision was made in the right upper quadrant and an access needle was then advanced through the incision  into the liver capsule. Repeat imaging demonstrated needle position in satisfactory position. The stylet was removed and 2 core needle biopsies were then performed of the suspected lesion in the right upper quadrant lateral aspect of the right lobe. All needles were then removed from the patient sterile dressing was applied. Samples were sent to pathology for further evaluation. IMPRESSION: Satisfactory needle core biopsy under CT guidance of a right lobe liver mass. Electronically Signed   By: Cordella Banner   On: 10/17/2024 10:01   MR 3D Recon At Scanner Result Date: 10/17/2024 CLINICAL DATA:  Pancreatitis.  Liver lesions. EXAM: MRI ABDOMEN WITHOUT AND WITH CONTRAST (INCLUDING MRCP) TECHNIQUE: Multiplanar multisequence MR imaging of the abdomen was performed both before and after the administration of intravenous contrast. Heavily T2-weighted images of the biliary and pancreatic ducts were obtained, and three-dimensional MRCP images were rendered by post processing. CONTRAST:  5mL GADAVIST  GADOBUTROL  1 MMOL/ML IV SOLN COMPARISON:  CT scan and abdominal ultrasound from 1 day prior. FINDINGS: Lower chest: Right pleural effusion. Hepatobiliary: Peripheral multicystic enhancing lesions are identified in the liver. 3.6 x 3.0 cm lesion in the hepatic dome seen on image 15/1001. Lateral right hepatic lobe lesion seen on 27/1 1,001 measures  6.3 x 3.8 cm. Mild periportal edema. No substantial intrahepatic biliary duct dilatation. Common duct measures 7 mm in the hepatoduodenal ligament with common bile duct measuring on the order of 4 mm in the pancreatic head. MRCP imaging is markedly motion degraded with no definite choledocholithiasis. Pancreas: No main duct dilatation. No focal pancreatic mass lesion. Pancreas does not appear frankly edematous by MRI. Spleen:  No splenomegaly. No suspicious focal mass lesion. Adrenals/Urinary Tract: No adrenal nodule or mass. Right kidney unremarkable. Multiple simple cysts are  identified in the left kidney, lower pole predominant. Stomach/Bowel: Stomach is unremarkable. No gastric wall thickening. No evidence of outlet obstruction. Duodenum appears edematous with wall thickening. As noted on previous CT, the duodenum does not cross the midline as expected, tracking down towards the pelvis anterior to the IVC. No obvious twisting or swirling of the duodenum and stomach is nondilated. The edema/inflammation in the right upper quadrant along the duodenum, gallbladder fossa, and hepatic flexure is progressive in the interval since the prior CT. Vascular/Lymphatic: Atherosclerotic disease characterizes the abdominal aorta. IVC is collapsed but appears patent. This may be related to hypovolemia or mass-effect from the edema/inflammation in the right upper quadrant and right retroperitoneal tissues. Main portal vein and bifurcation into the left and right portal vein patent. Splenic vein is diminutive but appears to be patent. There is mass-effect on the portal splenic confluence but the SMV does opacified. Other: Extensive edema is identified in the retroperitoneal anatomy of the right abdomen, extending down towards the pelvis and involving the musculature of the lateral right abdominal wall. Musculoskeletal: No focal suspicious marrow enhancement within the visualized bony anatomy. IMPRESSION: 1. Peripheral multicystic enhancing lesions in the liver. Imaging features raise concern for intrahepatic abscesses. Metastatic disease or primary hepatic neoplasm not excluded. 2. Edema/inflammation in the right upper quadrant along the duodenum, gallbladder fossa, and hepatic flexure is progressive in the interval since the prior CT. Increasing right pleural effusion since prior CT. 3. Duodenum does not cross the midline as expected and appears edematous with wall thickening. No obvious twisting or swirling of the duodenum and stomach is nondilated. Imaging features compatible with component of  malrotation although the SMA/SMV orientation appears appropriate 4. Gallbladder is distended with pericholecystic fluid. No definite cholelithiasis. 5. IVC is collapsed but appears patent. This may be related to hypovolemia or mass-effect from the edema/inflammation in the right upper quadrant/right retroperitoneal tissues. 6. No substantial intrahepatic biliary duct dilatation. Common duct measures 7 mm in the hepatoduodenal ligament with common bile duct measuring on the order of 4 mm in the pancreatic head. MRCP imaging is markedly motion degraded with no definite choledocholithiasis. 7. No main pancreatic duct dilatation. No focal pancreatic mass lesion. Pancreas does not appear overtly edematous by MRI. These results will be called to the ordering clinician or representative by the Radiologist Assistant, and communication documented in the PACS or [employer]. Electronically Signed   By: Camellia Candle M.D.   On: 10/17/2024 08:09   MR ABDOMEN MRCP W WO CONTAST Result Date: 10/15/2024 CLINICAL DATA:  Pancreatitis.  Liver lesions. EXAM: MRI ABDOMEN WITHOUT AND WITH CONTRAST (INCLUDING MRCP) TECHNIQUE: Multiplanar multisequence MR imaging of the abdomen was performed both before and after the administration of intravenous contrast. Heavily T2-weighted images of the biliary and pancreatic ducts were obtained, and three-dimensional MRCP images were rendered by post processing. CONTRAST:  5mL GADAVIST  GADOBUTROL  1 MMOL/ML IV SOLN COMPARISON:  CT scan and abdominal ultrasound from 1 day prior. FINDINGS: Lower chest:  Right pleural effusion. Hepatobiliary: Peripheral multicystic enhancing lesions are identified in the liver. 3.6 x 3.0 cm lesion in the hepatic dome seen on image 15/1001. Lateral right hepatic lobe lesion seen on 27/1 1,001 measures 6.3 x 3.8 cm. Mild periportal edema. No substantial intrahepatic biliary duct dilatation. Common duct measures 7 mm in the hepatoduodenal ligament with common bile  duct measuring on the order of 4 mm in the pancreatic head. MRCP imaging is markedly motion degraded with no definite choledocholithiasis. Pancreas: No main duct dilatation. No focal pancreatic mass lesion. Pancreas does not appear frankly edematous by MRI. Spleen:  No splenomegaly. No suspicious focal mass lesion. Adrenals/Urinary Tract: No adrenal nodule or mass. Right kidney unremarkable. Multiple simple cysts are identified in the left kidney, lower pole predominant. Stomach/Bowel: Stomach is unremarkable. No gastric wall thickening. No evidence of outlet obstruction. Duodenum appears edematous with wall thickening. As noted on previous CT, the duodenum does not cross the midline as expected, tracking down towards the pelvis anterior to the IVC. No obvious twisting or swirling of the duodenum and stomach is nondilated. The edema/inflammation in the right upper quadrant along the duodenum, gallbladder fossa, and hepatic flexure is progressive in the interval since the prior CT. Vascular/Lymphatic: Atherosclerotic disease characterizes the abdominal aorta. IVC is collapsed but appears patent. This may be related to hypovolemia or mass-effect from the edema/inflammation in the right upper quadrant and right retroperitoneal tissues. Main portal vein and bifurcation into the left and right portal vein patent. Splenic vein is diminutive but appears to be patent. There is mass-effect on the portal splenic confluence but the SMV does opacified. Other: Extensive edema is identified in the retroperitoneal anatomy of the right abdomen, extending down towards the pelvis and involving the musculature of the lateral right abdominal wall. Musculoskeletal: No focal suspicious marrow enhancement within the visualized bony anatomy. IMPRESSION: 1. Peripheral multicystic enhancing lesions in the liver. Imaging features raise concern for intrahepatic abscesses. Metastatic disease or primary hepatic neoplasm not excluded. 2.  Edema/inflammation in the right upper quadrant along the duodenum, gallbladder fossa, and hepatic flexure is progressive in the interval since the prior CT. Increasing right pleural effusion since prior CT. 3. Duodenum does not cross the midline as expected and appears edematous with wall thickening. No obvious twisting or swirling of the duodenum and stomach is nondilated. Imaging features compatible with component of malrotation although the SMA/SMV orientation appears appropriate 4. Gallbladder is distended with pericholecystic fluid. No definite cholelithiasis. 5. IVC is collapsed but appears patent. This may be related to hypovolemia or mass-effect from the edema/inflammation in the right upper quadrant/right retroperitoneal tissues. 6. No substantial intrahepatic biliary duct dilatation. Common duct measures 7 mm in the hepatoduodenal ligament with common bile duct measuring on the order of 4 mm in the pancreatic head. MRCP imaging is markedly motion degraded with no definite choledocholithiasis. 7. No main pancreatic duct dilatation. No focal pancreatic mass lesion. Pancreas does not appear overtly edematous by MRI. These results will be called to the ordering clinician or representative by the Radiologist Assistant, and communication documented in the PACS or [employer]. Electronically Signed   By: Camellia Candle M.D.   On: 10/15/2024 05:50   US  Abdomen Limited RUQ (LIVER/GB) Result Date: 10/13/2024 CLINICAL DATA:  Right upper quadrant abdominal pain. EXAM: ULTRASOUND ABDOMEN LIMITED RIGHT UPPER QUADRANT COMPARISON:  None Available. FINDINGS: Gallbladder: Gallbladder wall is upper normal to borderline thickened at 3.1 mm. No echogenic gallstones evident. Pericholecystic fluid evident. Sonographer reports no sonographic  Murphy sign. Common bile duct: Diameter: 8 mm Liver: Liver lesion seen on CT earlier today not readily evident by ultrasound. Liver appears heterogeneous and echogenic. Portal vein is  patent on color Doppler imaging with normal direction of blood flow towards the liver. Other: None. IMPRESSION: 1. Gallbladder wall is upper normal to borderline thickened at 3.1 mm. No echogenic gallstones evident and no sonographic Murphy sign. As on recent CT, pericholecystic fluid noted. Cholecystitis is not excluded. Correlation for signs/symptoms of cholecystitis recommended. Nuclear scintigraphy may prove helpful to further evaluate as clinically warranted. 2. Common bile duct is dilated at 8 mm. Correlation with liver function test recommended. 3. Liver appears heterogeneous and echogenic. This is a nonspecific finding but can be seen in the setting of hepatic steatosis. 4. Liver lesions seen on CT earlier today not readily evident by ultrasound. Electronically Signed   By: Camellia Candle M.D.   On: 10/13/2024 07:27   CT ABDOMEN PELVIS WO CONTRAST Result Date: 10/13/2024 CLINICAL DATA:  Abdominal pain with nausea. EXAM: CT ABDOMEN AND PELVIS WITHOUT CONTRAST TECHNIQUE: Multidetector CT imaging of the abdomen and pelvis was performed following the standard protocol without IV contrast. RADIATION DOSE REDUCTION: This exam was performed according to the departmental dose-optimization program which includes automated exposure control, adjustment of the mA and/or kV according to patient size and/or use of iterative reconstruction technique. COMPARISON:  None Available. FINDINGS: Lower chest: Calcified granuloma noted left lower lobe. Hepatobiliary: Small volume perihepatic free fluid with findings suspicious for periportal edema although assessment is limited by lack of intravenous contrast material. 4.8 x 3.1 cm subtle lesion identified right liver on image 15 of series 3. Additional smaller lesions seen towards the dome of the liver measuring up to 2.1 cm on image 9 of series 3. Gallbladder is distended with features suggesting gallbladder wall thickening. No calcified gallstones evident. There is  edema/fluid in the region the gallbladder fossa and hepatoduodenal ligament. Although assessment limited by lack of intravenous contrast material, no gross dilatation of the common bile duct. Pancreas: No definite pancreatic mass.  No main duct dilatation. Spleen: No splenomegaly. No suspicious focal mass lesion. Adrenals/Urinary Tract: No adrenal nodule or mass. Right kidney unremarkable. Low-density lesions in the left kidney approach water density and are likely cysts. No evidence for hydroureter. Bladder unremarkable. Stomach/Bowel: Stomach is unremarkable. No gastric wall thickening. No evidence of outlet obstruction. Duodenum passes through the area of edema/inflammatory change of the right upper quadrant. Duodenum does not cross the midline as expected suggesting a component of small bowel malrotation. No small bowel wall thickening. No small bowel dilatation. Despite the course of the duodenum, the cecum is positioned in the right lower quadrant. The terminal ileum is normal. The appendix is normal. There is some fluid adjacent to the distal ascending colon and hepatic flexure. Diverticular disease is noted in the left colon without diverticulitis. Vascular/Lymphatic: There is advanced atherosclerotic calcification of the abdominal aorta without aneurysm. No abdominal lymphadenopathy. No pelvic lymphadenopathy. Reproductive: Uterine fibroids evident.  There is no adnexal mass. Other: No substantial free fluid in the pelvis. Musculoskeletal: No worrisome lytic or sclerotic osseous abnormality. IMPRESSION: 1. 4.8 x 3.1 cm subtle lesion identified right liver with additional smaller lesions seen towards the dome of the liver measuring up to 2.1 cm. Imaging features are indeterminate. MRI of the abdomen with and without contrast recommended to further evaluate as metastatic disease or primary hepatic neoplasm not excluded. 2. Gallbladder is distended with features suggesting ill-defined gallbladder wall  thickening on this noncontrast study. No calcified gallstones evident. There is edema/fluid in the region the gallbladder fossa and hepatoduodenal ligament. Imaging features are nonspecific but could be seen in the setting of acute cholecystitis. Right upper quadrant ultrasound may prove helpful to further evaluate. 3. Edema/inflammation is identified along the distal ascending colon and hepatic flexure, potentially secondary to changes described above in the gallbladder fossa. No associated colonic wall thickening to suggest colon represents etiology of these findings, but colitis is not excluded. 4. Small volume perihepatic free fluid with question of periportal edema. 5. Left colonic diverticulosis without diverticulitis. 6. Uterine fibroids. 7.  Aortic Atherosclerosis (ICD10-I70.0). Electronically Signed   By: Camellia Candle M.D.   On: 10/13/2024 06:41   DG Chest Port 1 View Result Date: 10/13/2024 CLINICAL DATA:  Possible sepsis. EXAM: PORTABLE CHEST 1 VIEW COMPARISON:  None Available. FINDINGS: The lungs are clear without focal pneumonia, edema, pneumothorax or pleural effusion. Biapical pleuroparenchymal scarring evident. Cardiopericardial silhouette is at upper limits of normal for size. No acute bony abnormality. Telemetry leads overlie the chest. IMPRESSION: No active disease. Electronically Signed   By: Camellia Candle M.D.   On: 10/13/2024 05:46    Labs:  CBC: Recent Labs    10/25/24 0020 10/26/24 0245 10/27/24 0333 10/31/24 0739  WBC 9.3 6.7 6.4 4.7  HGB 9.4* 9.6* 9.5* 10.9*  HCT 29.4* 29.4* 29.2* 34.5*  PLT 267 307 373 425*    COAGS: Recent Labs    10/13/24 0517 10/17/24 0404 10/24/24 1223 10/25/24 0020  INR 1.1 1.1 1.2 1.2    BMP: Recent Labs    10/26/24 0245 10/27/24 0333 10/29/24 0501 10/30/24 0343  NA 138 139 140 141  K 4.2 4.1 3.6 3.7  CL 105 108 108 108  CO2 23 23 20* 21*  GLUCOSE 95 88 80 84  BUN <5* 5* <5* 6*  CALCIUM  9.3 9.1 9.1 9.2  CREATININE 1.24*  1.36* 1.40* 1.20*  GFRNONAA 42* 38* 37* 44*    LIVER FUNCTION TESTS: Recent Labs    10/26/24 0245 10/27/24 0333 10/28/24 1354 10/29/24 0501 10/30/24 0343  BILITOT 0.9 0.6  --  0.9 0.8  AST 31 35  --  38 49*  ALT 22 20  --  23 27  ALKPHOS 54 50  --  51 56  PROT 5.3* 5.5* 7.1 5.4* 5.8*  ALBUMIN 2.0* 2.1*  --  2.2* 2.4*    TUMOR MARKERS: No results for input(s): AFPTM, CEA, CA199, CHROMGRNA in the last 8760 hours.  Assessment and Plan:  89 year old female with a history of acute pancreatitis complicated by a large pseudocyst extending from the porta hepatis and down the right paracolic gutter and into the anatomic pelvis.  She has a percutaneous drainage catheter in place which appears clogged.  CT imaging confirms a persistent large surrounding fluid collection.  She does not appear acutely infected or septic.  The drainage catheter was exchanged, repositioned and upsized.  There is no purulent fluid within the collection but there is extensive mucinous material and debris.  I believe she will likely require an even larger drainage catheter to facilitate complete drainage and prevent reinfection.  1.)  Please schedule at the hospital next week for drain exchange and upsize in interventional radiology. No sedation needed.  Electronically Signed: Wilkie MARLA Lent 11/10/2024, 12:22 PM   I spent a total of  15 Minutes in face to face in clinical consultation, greater than 50% of which was counseling/coordinating care for pancreatic pseudocyst

## 2024-11-11 ENCOUNTER — Ambulatory Visit: Payer: PRIVATE HEALTH INSURANCE | Admitting: Family

## 2024-11-11 ENCOUNTER — Encounter: Payer: Self-pay | Admitting: Family

## 2024-11-11 ENCOUNTER — Ambulatory Visit: Attending: Family

## 2024-11-11 VITALS — BP 122/82 | HR 64 | Temp 98.0°F | Ht <= 58 in | Wt 110.0 lb

## 2024-11-11 DIAGNOSIS — K219 Gastro-esophageal reflux disease without esophagitis: Secondary | ICD-10-CM

## 2024-11-11 DIAGNOSIS — I493 Ventricular premature depolarization: Secondary | ICD-10-CM

## 2024-11-11 DIAGNOSIS — R188 Other ascites: Secondary | ICD-10-CM

## 2024-11-11 DIAGNOSIS — K851 Biliary acute pancreatitis without necrosis or infection: Secondary | ICD-10-CM

## 2024-11-11 DIAGNOSIS — I499 Cardiac arrhythmia, unspecified: Secondary | ICD-10-CM

## 2024-11-11 DIAGNOSIS — K651 Peritoneal abscess: Secondary | ICD-10-CM

## 2024-11-11 DIAGNOSIS — Z8719 Personal history of other diseases of the digestive system: Secondary | ICD-10-CM

## 2024-11-11 DIAGNOSIS — E440 Moderate protein-calorie malnutrition: Secondary | ICD-10-CM

## 2024-11-11 DIAGNOSIS — R748 Abnormal levels of other serum enzymes: Secondary | ICD-10-CM

## 2024-11-11 DIAGNOSIS — R29898 Other symptoms and signs involving the musculoskeletal system: Secondary | ICD-10-CM

## 2024-11-11 DIAGNOSIS — R9431 Abnormal electrocardiogram [ECG] [EKG]: Secondary | ICD-10-CM

## 2024-11-11 DIAGNOSIS — R5381 Other malaise: Secondary | ICD-10-CM

## 2024-11-11 DIAGNOSIS — N1832 Chronic kidney disease, stage 3b: Secondary | ICD-10-CM

## 2024-11-11 DIAGNOSIS — R7989 Other specified abnormal findings of blood chemistry: Secondary | ICD-10-CM

## 2024-11-11 DIAGNOSIS — I10 Essential (primary) hypertension: Secondary | ICD-10-CM

## 2024-11-11 LAB — CBC WITH DIFFERENTIAL/PLATELET
Basophils Absolute: 0 K/uL (ref 0.0–0.1)
Basophils Relative: 0.6 % (ref 0.0–3.0)
Eosinophils Absolute: 0 K/uL (ref 0.0–0.7)
Eosinophils Relative: 0.5 % (ref 0.0–5.0)
HCT: 35.5 % — ABNORMAL LOW (ref 36.0–46.0)
Hemoglobin: 11.6 g/dL — ABNORMAL LOW (ref 12.0–15.0)
Lymphocytes Relative: 31.7 % (ref 12.0–46.0)
Lymphs Abs: 2.5 K/uL (ref 0.7–4.0)
MCHC: 32.6 g/dL (ref 30.0–36.0)
MCV: 82.1 fl (ref 78.0–100.0)
Monocytes Absolute: 0.7 K/uL (ref 0.1–1.0)
Monocytes Relative: 9.3 % (ref 3.0–12.0)
Neutro Abs: 4.6 K/uL (ref 1.4–7.7)
Neutrophils Relative %: 57.9 % (ref 43.0–77.0)
Platelets: 257 K/uL (ref 150.0–400.0)
RBC: 4.32 Mil/uL (ref 3.87–5.11)
RDW: 17.8 % — ABNORMAL HIGH (ref 11.5–15.5)
WBC: 7.9 K/uL (ref 4.0–10.5)

## 2024-11-11 LAB — COMPREHENSIVE METABOLIC PANEL WITH GFR
ALT: 11 U/L (ref 0–35)
AST: 25 U/L (ref 0–37)
Albumin: 3.2 g/dL — ABNORMAL LOW (ref 3.5–5.2)
Alkaline Phosphatase: 80 U/L (ref 39–117)
BUN: 12 mg/dL (ref 6–23)
CO2: 28 meq/L (ref 19–32)
Calcium: 10 mg/dL (ref 8.4–10.5)
Chloride: 97 meq/L (ref 96–112)
Creatinine, Ser: 1.14 mg/dL (ref 0.40–1.20)
GFR: 43.66 mL/min — ABNORMAL LOW (ref >60.00)
Glucose, Bld: 118 mg/dL — ABNORMAL HIGH (ref 70–99)
Potassium: 3.5 meq/L (ref 3.5–5.1)
Sodium: 141 meq/L (ref 135–145)
Total Bilirubin: 0.9 mg/dL (ref 0.2–1.2)
Total Protein: 6.4 g/dL (ref 6.0–8.3)

## 2024-11-11 LAB — LIPASE: Lipase: 176 U/L — ABNORMAL HIGH (ref 11.0–59.0)

## 2024-11-11 LAB — B12 AND FOLATE PANEL
Folate: >23.7 ng/mL (ref >5.9)
Vitamin B-12: 1464 pg/mL — ABNORMAL HIGH (ref 211–911)

## 2024-11-11 LAB — TSH: TSH: 7.15 u[IU]/mL — ABNORMAL HIGH (ref 0.35–5.50)

## 2024-11-11 LAB — MAGNESIUM: Magnesium: 1.3 mg/dL — ABNORMAL LOW (ref 1.5–2.5)

## 2024-11-11 LAB — AMYLASE: Amylase: 89 U/L (ref 27–131)

## 2024-11-11 MED ORDER — FERROUS SULFATE 325 (65 FE) MG PO TABS: 325.0000 mg | ORAL_TABLET | Freq: Every day | ORAL | 0 refills | Status: DC

## 2024-11-11 MED ORDER — PANTOPRAZOLE SODIUM 40 MG PO TBEC: 40.0000 mg | DELAYED_RELEASE_TABLET | Freq: Two times a day (BID) | ORAL | 0 refills | Status: DC

## 2024-11-11 MED ORDER — METOPROLOL TARTRATE 25 MG PO TABS: 12.5000 mg | ORAL_TABLET | Freq: Two times a day (BID) | ORAL | 0 refills | Status: AC

## 2024-11-11 MED ORDER — AMLODIPINE BESYLATE 10 MG PO TABS: 10.0000 mg | ORAL_TABLET | Freq: Every day | ORAL | 0 refills | Status: AC

## 2024-11-11 NOTE — Progress Notes (Signed)
 Patient for IR drain exchange and upsize with local sedation per Dr Karalee on Monday 11/14/24, I called and spoke with the patient's daughter, [name] on the phone and gave pre-procedure instructions. [name] was made aware to have the patient here at 2:30p and check in at the H&V entrance. [name] stated understanding.  Called 11/11/24

## 2024-11-11 NOTE — Progress Notes (Signed)
 Established Patient Office Visit  Subjective:      CC:  Chief Complaint  Patient presents with   Hospitalization Follow-up    HPI: Haley Randall is a 88 y.o. female presenting on 11/11/2024 for Hospitalization Follow-up .  Discussed the use of AI scribe software for clinical note transcription with the patient, who gave verbal consent to proceed.  History of Present Illness Haley Randall is an 88 year old female who presents for follow-up after a recent hospitalization for acute pancreatitis.  She was hospitalized from November 13th to December 1st due to acute pancreatitis, characterized by abdominal pain and cramps primarily in the upper and lower abdomen, more on the left side with radiation to the right. During her hospitalization, she experienced low blood pressure and required breathing support in the ER. Initial lab work showed elevated white blood cells, hemoglobin of 10.8, BUN of 18, creatinine of 1.44, and significantly elevated lipase at over 2800. Liver function tests were elevated with AST at 293, ALT at 102, and total bilirubin at 1.7. A CT scan revealed a right liver lesion, gallbladder distension, and inflammation in the distal acetylcholine. A liver biopsy showed no malignant cells. She had an intraabdominal abscess with a drain placed, and thoracentesis was performed for a right pleural effusion. She was treated with IV fluids, fentanyl  for pain, and Zosyn  antibiotics.  She has a history of mild dementia, with some forgetfulness noted by her daughter, but she recognizes people and does not drive. She also has chronic kidney disease stage 3, with a recent acute kidney injury during hospitalization, which has since stabilized. Her hemoglobin dropped to 7.3 during her stay, requiring a blood transfusion, and she continues on ferrous sulfate  for anemia of chronic disease.  She reports decreased appetite and has been focusing on hydration and nutrition, including protein  shakes and bananas. She has been experiencing some weakness and fatigue, particularly after physical therapy sessions at home. She is currently on medications including amlodipine , metoprolol , ferrous sulfate , and pantoprazole .  Her daughter reports that she has not been experiencing any significant shortness of breath or chest pain recently, although she does get tired with walking. She has a history of occasional premature ventricular complexes, but no new cardiac symptoms have been noted.          Social history:  Relevant past medical, surgical, family and social history reviewed and updated as indicated. Interim medical history since our last visit reviewed.  Allergies and medications reviewed and updated.  DATA REVIEWED: CHART IN EPIC     ROS: Negative unless specifically indicated above in HPI.   Current Medications[1]        Objective:        BP 122/82 (BP Location: Right Arm, Patient Position: Sitting, Cuff Size: Normal)   Pulse 64   Temp 98 F (36.7 C) (Temporal)   Ht 4' 10 (1.473 m)   Wt 110 lb (49.9 kg)   SpO2 98%   BMI 22.99 kg/m   Physical Exam VITALS: P- 91 CARDIOVASCULAR: Occasional premature ventricular complexes.  Wt Readings from Last 3 Encounters:  11/11/24 110 lb (49.9 kg)  10/22/24 128 lb 4.9 oz (58.2 kg)  10/26/23 104 lb 3.2 oz (47.3 kg)    Physical Exam Constitutional:      General: She is not in acute distress.    Appearance: Normal appearance. She is normal weight. She is not ill-appearing, toxic-appearing or diaphoretic.  HENT:     Head: Normocephalic.  Cardiovascular:  Rate and Rhythm: Normal rate. Rhythm irregularly irregular. Frequent Extrasystoles are present. Pulmonary:     Effort: Pulmonary effort is normal.     Breath sounds: Normal breath sounds.  Abdominal:     Comments: Rlq abd with drain in place covered with dressing no drainage on dressing  Musculoskeletal:        General: Normal range of motion.      Right lower leg: No edema.     Left lower leg: No edema.  Neurological:     General: No focal deficit present.     Mental Status: She is alert and oriented to person, place, and time. Mental status is at baseline.  Psychiatric:        Mood and Affect: Mood normal.        Behavior: Behavior normal.        Thought Content: Thought content normal.        Judgment: Judgment normal.          Results LABS Hb: 10.8 (10/13/2024) BUN: 18 (10/13/2024) Cr: 1.44 (10/13/2024) Lipase: >2800 (10/13/2024) AST: 293 (10/13/2024) ALT: 102 (10/13/2024) Total bilirubin: 1.7 (10/13/2024) Lactic acid: 2.7 (10/13/2024) UA: Many bacteria (10/13/2024) Amylase: 334 Hb: 7.3 (10/19/2024) TSH: Elevated Ammonia: Minimally elevated Amylase body fluid: Pleural fluid right lung LD fluid: Elevated  RADIOLOGY Chest X-ray: No acute abnormality (10/13/2024) CT abdomen and pelvis: Right liver lesion with additional smaller lesions up to 2.1 cm, gallbladder distended, ill-defined gallbladder wall thickening, erythema, inflammation in distal acetylcholine (10/13/2024) MRI liver: Negative for acute cholecystitis, normal biliary to bowel transit, progressive complex fluid accumulation in right hemohabdomen, early laculation (10/25/2024)  DIAGNOSTIC EKG: Occasional PVCs, PACs, nonspecific T wave abnormality  PATHOLOGY Liver biopsy: No malignant cells Right pleural effusion cytology: Transudate, Gram stain and cultures negative (10/28/2024)  Assessment & Plan:   Assessment and Plan Assessment & Plan Acute biliary pancreatitis complicated by intra-abdominal abscess and retroperitoneal fluid collection Recent hospitalization for acute biliary pancreatitis with intra-abdominal abscess and retroperitoneal fluid collection. Elevated pancreatic enzymes and liver function tests. Gallbladder distension and wall thickening. No malignant cells on liver biopsy. Persistent fluid collection requiring drain placement.  Improvement in liver function tests and reduction in pancreatic enzyme levels. No active infection on recent imaging. - Continue follow-up with interventional radiology for drain management. - Attend GI appointment on January 15th for further evaluation. - Encouraged hydration and nutritional intake to support recovery.  Chronic kidney disease, stage 3b with anemia Chronic kidney disease stage 3b with recent acute kidney injury likely due to infection. Anemia with hemoglobin drop to 7.3, requiring transfusion. Anemia consistent with chronic disease. Kidney function well-managed post-acute injury. - Continue ferrous sulfate  for anemia management. - Monitor kidney function and anemia with repeat labs.  Malnutrition and physical deconditioning Malnutrition and physical deconditioning following prolonged hospitalization. Decreased appetite and difficulty maintaining nutritional intake. Physical therapy ongoing to address deconditioning. - Encouraged nutritional intake with protein shakes and high-calorie foods. - Continue physical therapy to improve strength and mobility. - Ordered rolling walker for mobility support.  Cardiac arrhythmia Occasional premature ventricular complexes and premature atrial contractions. Mild symptoms reported, including general weakness and fatigue, occasional shortness of breath, and need to sit down while walking. Metoprolol  prescribed for arrhythmia management. - Continue metoprolol  for arrhythmia management. - Performed EKG to assess current cardiac rhythm.  Primary hypertension Hypertension managed with amlodipine . - Continue amlodipine  for blood pressure management.  EKG readout  PR 148 New ST depression Since asymptomatic with no chest pain, DOE recommended  she schedule overdue cardiology f/u with Dr. Santo or APP. For frequent pac will order tsh bmp mag cbc and also 14 day heart monitor to evaluate burden.  With frequent PAC usually would do TSH,  BMP, mag, CBC and 7-14 day monitor just to quantify burden. If PAC burden <15%, no need for echo. if PAC burden >15%, next step would be echo.      Return in about 1 month (around 12/12/2024) for f/u blood pressure.     Ginger Patrick, MSN, APRN, FNP-C [facility]        [1]  Current Outpatient Medications:    acetaminophen  (TYLENOL ) 325 MG tablet, Take 2 tablets (650 mg total) by mouth every 6 (six) hours as needed for moderate pain (pain score 4-6) (headache)., Disp: , Rfl:    folic acid  (FOLVITE ) 1 MG tablet, Take 1 tablet (1 mg total) by mouth daily., Disp: 30 tablet, Rfl: 0   Multiple Vitamin (MULTIVITAMIN WITH MINERALS) TABS tablet, Take 1 tablet by mouth daily., Disp: 30 tablet, Rfl: 0   sodium chloride  flush (NS) 0.9 % SOLN, Flush your abd drain with 5 mL of NS from the prefilled syringe once daily. Do not forget to charge the bulb again after administering this., Disp: 300 mL, Rfl: 0   amLODipine  (NORVASC ) 10 MG tablet, Take 1 tablet (10 mg total) by mouth daily., Disp: 60 tablet, Rfl: 0   ferrous sulfate  325 (65 FE) MG tablet, Take 1 tablet (325 mg total) by mouth daily with breakfast., Disp: 30 tablet, Rfl: 0   metoprolol  tartrate (LOPRESSOR ) 25 MG tablet, Take 0.5 tablets (12.5 mg total) by mouth 2 (two) times daily., Disp: 60 tablet, Rfl: 0   pantoprazole  (PROTONIX ) 40 MG tablet, Take 1 tablet (40 mg total) by mouth 2 (two) times daily., Disp: 60 tablet, Rfl: 0

## 2024-11-11 NOTE — Patient Instructions (Signed)
°  Zio monitor ordered   Call cardiology to schedule f/u preferably in the next two weeks

## 2024-11-14 ENCOUNTER — Ambulatory Visit
Admission: RE | Admit: 2024-11-14 | Discharge: 2024-11-14 | Disposition: A | Source: Ambulatory Visit | Attending: Interventional Radiology | Admitting: Interventional Radiology

## 2024-11-14 ENCOUNTER — Ambulatory Visit: Payer: Self-pay | Admitting: Family

## 2024-11-14 DIAGNOSIS — E038 Other specified hypothyroidism: Secondary | ICD-10-CM | POA: Insufficient documentation

## 2024-11-14 NOTE — Progress Notes (Signed)
 noted

## 2024-11-16 ENCOUNTER — Ambulatory Visit
Admission: RE | Admit: 2024-11-16 | Discharge: 2024-11-16 | Disposition: A | Discharge status: Home / Self Care | Source: Ambulatory Visit | Attending: Interventional Radiology | Admitting: Interventional Radiology

## 2024-11-16 ENCOUNTER — Other Ambulatory Visit: Payer: Self-pay

## 2024-11-16 DIAGNOSIS — N1832 Chronic kidney disease, stage 3b: Secondary | ICD-10-CM | POA: Diagnosis not present

## 2024-11-16 DIAGNOSIS — G9341 Metabolic encephalopathy: Secondary | ICD-10-CM | POA: Diagnosis not present

## 2024-11-16 DIAGNOSIS — F03A0 Unspecified dementia, mild, without behavioral disturbance, psychotic disturbance, mood disturbance, and anxiety: Secondary | ICD-10-CM | POA: Diagnosis not present

## 2024-11-16 DIAGNOSIS — N179 Acute kidney failure, unspecified: Secondary | ICD-10-CM | POA: Diagnosis not present

## 2024-11-16 DIAGNOSIS — D631 Anemia in chronic kidney disease: Secondary | ICD-10-CM | POA: Diagnosis not present

## 2024-11-16 DIAGNOSIS — Z4803 Encounter for change or removal of drains: Secondary | ICD-10-CM | POA: Diagnosis present

## 2024-11-16 DIAGNOSIS — E876 Hypokalemia: Secondary | ICD-10-CM | POA: Diagnosis not present

## 2024-11-16 DIAGNOSIS — I129 Hypertensive chronic kidney disease with stage 1 through stage 4 chronic kidney disease, or unspecified chronic kidney disease: Secondary | ICD-10-CM | POA: Diagnosis not present

## 2024-11-16 DIAGNOSIS — K863 Pseudocyst of pancreas: Secondary | ICD-10-CM | POA: Insufficient documentation

## 2024-11-16 DIAGNOSIS — M1A0790 Idiopathic chronic gout, unspecified ankle and foot, without tophus (tophi): Secondary | ICD-10-CM | POA: Diagnosis not present

## 2024-11-16 DIAGNOSIS — K7689 Other specified diseases of liver: Secondary | ICD-10-CM | POA: Diagnosis not present

## 2024-11-16 DIAGNOSIS — E872 Acidosis, unspecified: Secondary | ICD-10-CM | POA: Diagnosis not present

## 2024-11-16 HISTORY — PX: IR CATHETER TUBE CHANGE: IMG717

## 2024-11-16 MED ORDER — IOHEXOL 300 MG/ML  SOLN
5.0000 mL | Freq: Once | INTRAMUSCULAR | Status: AC | PRN
  Administered 2024-11-16: 15:00:00 5 mL

## 2024-11-16 MED ORDER — LIDOCAINE HCL 1 % IJ SOLN
INTRAMUSCULAR | Status: AC
  Filled 2024-11-16: qty 20

## 2024-11-17 ENCOUNTER — Telehealth: Payer: Self-pay | Admitting: Family

## 2024-11-17 NOTE — Telephone Encounter (Signed)
 Copied from CRM [id number]. Topic: Clinical - Prescription Issue >> Nov 17, 2024  9:02 AM Haley Randall wrote: Reason for CRM: Patient's daughter is calling because patient was seen on 11/11/2024 after for a hospital follow up and asked to send all current prescriptions to [facility] on file; however, two were missing folic acid  (FOLVITE ) 1 MG tablet [[id number]] & Multiple Vitamin (MULTIVITAMIN WITH MINERALS) TABS tablet [[id number]]. These were given during her hospital admission.

## 2024-11-17 NOTE — Addendum Note (Signed)
 Addended by: CORWIN ANTU on: 11/17/2024 10:43 PM   Modules accepted: Orders

## 2024-11-29 ENCOUNTER — Telehealth: Payer: Self-pay

## 2024-11-29 ENCOUNTER — Telehealth: Payer: Self-pay | Admitting: Family

## 2024-11-29 NOTE — Telephone Encounter (Signed)
 Patient for IR drain check & poss removal on Mon 12/05/24, I called and spoke with the patient's daughter, [name] on the phone and gave pre-procedure instructions. [name] was made aware to have the patient here at 12:15p and check in at the [facility] & [facility] entrance. [name] stated understanding.  Called 11/29/24

## 2024-11-29 NOTE — Telephone Encounter (Signed)
 Spoke with pt's daughter, [name]. Pt still has not received her walker that was ordered on 11/11/24. Advised [name] that I faxed this order the day of the pt's last OV. All documents have been refaxed to [facility] at [phone].

## 2024-11-29 NOTE — Telephone Encounter (Signed)
 Copied from CRM #[id number]. Topic: General - Other >> Nov 29, 2024  8:55 AM Tonda B wrote: Reason for CRM: patient daughter lonell is calling has questions about mom wheelchair please call [phone]

## 2024-12-05 ENCOUNTER — Other Ambulatory Visit: Payer: Self-pay | Admitting: Surgery

## 2024-12-05 ENCOUNTER — Other Ambulatory Visit: Payer: Self-pay | Admitting: Interventional Radiology

## 2024-12-05 ENCOUNTER — Ambulatory Visit
Admission: RE | Admit: 2024-12-05 | Discharge: 2024-12-05 | Disposition: A | Discharge status: Home / Self Care | Source: Ambulatory Visit

## 2024-12-05 ENCOUNTER — Ambulatory Visit
Admission: RE | Admit: 2024-12-05 | Discharge: 2024-12-05 | Disposition: A | Discharge status: Home / Self Care | Source: Ambulatory Visit | Attending: Urology | Admitting: Urology

## 2024-12-05 DIAGNOSIS — K863 Pseudocyst of pancreas: Secondary | ICD-10-CM

## 2024-12-05 DIAGNOSIS — Z4803 Encounter for change or removal of drains: Secondary | ICD-10-CM | POA: Diagnosis present

## 2024-12-05 DIAGNOSIS — K651 Peritoneal abscess: Secondary | ICD-10-CM | POA: Diagnosis not present

## 2024-12-05 HISTORY — PX: IR CATHETER TUBE CHANGE: IMG717

## 2024-12-05 MED ORDER — IOHEXOL 300 MG/ML  SOLN
15.0000 mL | Freq: Once | INTRAMUSCULAR | Status: AC | PRN
  Administered 2024-12-05: 15 mL

## 2024-12-05 MED ORDER — LIDOCAINE HCL 1 % IJ SOLN
INTRAMUSCULAR | Status: AC
  Filled 2024-12-05: qty 20

## 2024-12-05 NOTE — Telephone Encounter (Signed)
 Spoke with pt's daughter and let her know that per Tabitha, pt does not need to continue the folic acid .

## 2024-12-05 NOTE — Telephone Encounter (Unsigned)
 Copied from CRM [id number]. Topic: Clinical - Medication Question >> Dec 05, 2024 11:57 AM Maisie BROCKS wrote: Reason for CRM: pt's daughter called because the pharmacy at [facility] is saying they did not receive the prescription for folic acid  on 12/18. Please advise.

## 2024-12-06 NOTE — Addendum Note (Signed)
 Encounter addended by: Mackovic, Khamauri Bauernfeind N on: 12/06/2024 9:20 AM  Actions taken: Imaging Exam ended, Charge Capture section accepted

## 2024-12-07 ENCOUNTER — Other Ambulatory Visit: Payer: Self-pay | Admitting: Family

## 2024-12-07 DIAGNOSIS — D631 Anemia in chronic kidney disease: Secondary | ICD-10-CM

## 2024-12-07 DIAGNOSIS — K219 Gastro-esophageal reflux disease without esophagitis: Secondary | ICD-10-CM

## 2024-12-09 NOTE — Progress Notes (Signed)
 "   Chief Complaint: The patient is seen in follow up today s/p drain placement for pancreatic pseudocyst on 11/25 followed by exchange and upsize on 12/11 and subsequent exchange on 12/17 followed by upsize to 16Fr on 1/5  Referring Physician(s): Connor,Chelsea A  Supervising Physician: Karalee Beat  History of present illness:  89 year old female with a history of pancreatitis and complex pancreatic pseudocyst extending from the region of the porta hepatis down the right paracolic gutter and into the anatomic pelvis. There was concern for possible infection and a percutaneous 10Fr drain was placed on 11/25 by Dr. Jennefer. Patient was seen for evaluation on 12/11 at which time she reported minimal output from her drain and repeat imaging demonstrated a persistent large fluid collection. Patient underwent upsize to a 12Fr drain that day with recommendation for additional upsize. Her drain was again exchanged on 12/17 followed by upsize to 16Fr with aggressive lavage of residual pseudocyst on 1/5.   She presents to clinic today for follow up. She reports *** CT imaging demonstrates ***  Past Medical History:  Diagnosis Date   Chest pain    Dizziness    Hyperlipidemia    Hypertension    Idiopathic gout of foot 08/30/2020    Past Surgical History:  Procedure Laterality Date   BREAST SURGERY Left 1980   biopsy   IR CATHETER TUBE CHANGE  11/10/2024   IR CATHETER TUBE CHANGE  11/16/2024   IR CATHETER TUBE CHANGE  12/05/2024   IR RADIOLOGIST EVAL & MGMT  11/10/2024   IR THORACENTESIS RIGHT ASP PLEURAL SPACE W/IMG GUIDE  10/28/2024    Allergies: Lisinopril   Medications: Prior to Admission medications  Medication Sig Start Date End Date Taking? Authorizing Provider  acetaminophen  (TYLENOL ) 325 MG tablet Take 2 tablets (650 mg total) by mouth every 6 (six) hours as needed for moderate pain (pain score 4-6) (headache). 10/31/24   Elgergawy, Brayton RAMAN, MD  amLODipine  (NORVASC ) 10 MG  tablet Take 1 tablet (10 mg total) by mouth daily. 11/11/24 12/11/24  Corwin Antu, FNP  ferrous sulfate  325 (65 FE) MG tablet TAKE 1 TABLET BY MOUTH EVERY DAY WITH BREAKFAST 12/07/24   Corwin Antu, FNP  metoprolol  tartrate (LOPRESSOR ) 25 MG tablet Take 0.5 tablets (12.5 mg total) by mouth 2 (two) times daily. 11/11/24 12/11/24  Corwin Antu, FNP  Multiple Vitamins-Minerals (MULTIVITAMIN) tablet Take 1 tablet by mouth daily. 11/17/24   Dugal, Tabitha, FNP  pantoprazole  (PROTONIX ) 40 MG tablet TAKE 1 TABLET BY MOUTH TWICE A DAY 12/07/24   Corwin Antu, FNP  sodium chloride  flush (NS) 0.9 % SOLN Flush your abd drain with 5 mL of NS from the prefilled syringe once daily. Do not forget to charge the bulb again after administering this. 10/31/24   Huneycutt, Brittany, NP     Family History  Problem Relation Age of Onset   Heart disease Mother    Heart disease Father    Stroke Maternal Grandmother    Stroke Maternal Grandfather    Heart disease Paternal Grandmother    Heart disease Paternal Grandfather    Cancer Neg Hx    Diabetes Neg Hx     Social History   Socioeconomic History   Marital status: Married    Spouse name: Fannie   Number of children: 1   Years of education: Not on file   Highest education level: Not on file  Occupational History   Occupation: retired  Tobacco Use   Smoking status: Former   Smokeless tobacco:  Never   Tobacco comments:    quit 30 years ago  Vaping Use   Vaping status: Never Used  Substance and Sexual Activity   Alcohol use: Not Currently   Drug use: No   Sexual activity: Not Currently  Other Topics Concern   Not on file  Social History Narrative   05/23/22   From: originally from [place], then [place]    Living: with husband education officer, environmental) and grandson   Work: retired - book keeping      Family: one adopted daughter - [name]      Enjoys: nothing currently      Exercise: not currently   Diet: avoids certain foods - seafood, red meat      Safety   Seat  belts: Yes    Guns: No   Safe in relationships: Yes       Social Drivers of Health   Tobacco Use: Medium Risk (11/11/2024)   Patient History    Smoking Tobacco Use: Former    Smokeless Tobacco Use: Never    Passive Exposure: Not on Actuary Strain: Low Risk (08/24/2023)   Overall Financial Resource Strain (CARDIA)    Difficulty of Paying Living Expenses: Not hard at all  Food Insecurity: No Food Insecurity (10/13/2024)   Epic    Worried About Programme Researcher, Broadcasting/film/video in the Last Year: Never true    Ran Out of Food in the Last Year: Never true  Transportation Needs: No Transportation Needs (10/13/2024)   Epic    Lack of Transportation (Medical): No    Lack of Transportation (Non-Medical): No  Physical Activity: Inactive (08/24/2023)   Exercise Vital Sign    Days of Exercise per Week: 0 days    Minutes of Exercise per Session: 0 min  Stress: No Stress Concern Present (08/24/2023)   [employer] of Occupational Health - Occupational Stress Questionnaire    Feeling of Stress : Not at all  Social Connections: Socially Isolated (10/13/2024)   Social Connection and Isolation Panel    Frequency of Communication with Friends and Family: Once a week    Frequency of Social Gatherings with Friends and Family: Once a week    Attends Religious Services: Never    Database Administrator or Organizations: No    Attends Banker Meetings: Never    Marital Status: Widowed  Depression (PHQ2-9): Low Risk (11/11/2024)   Depression (PHQ2-9)    PHQ-2 Score: 1  Alcohol Screen: Low Risk (08/24/2023)   Alcohol Screen    Last Alcohol Screening Score (AUDIT): 0  Housing: Low Risk (10/13/2024)   Epic    Unable to Pay for Housing in the Last Year: No    Number of Times Moved in the Last Year: 0    Homeless in the Last Year: No  Utilities: Not At Risk (10/13/2024)   Epic    Threatened with loss of utilities: No  Health Literacy: Adequate Health Literacy (08/24/2023)    B1300 Health Literacy    Frequency of need for help with medical instructions: Never    Vital Signs: There were no vitals taken for this visit.  Physical Exam  Imaging: No results found.  Labs:  CBC: Recent Labs    10/26/24 0245 10/27/24 0333 10/31/24 0739 11/11/24 1115  WBC 6.7 6.4 4.7 7.9  HGB 9.6* 9.5* 10.9* 11.6*  HCT 29.4* 29.2* 34.5* 35.5*  PLT 307 373 425* 257.0    COAGS: Recent Labs    10/13/24 0517 10/17/24  0404 10/24/24 1223 10/25/24 0020  INR 1.1 1.1 1.2 1.2    BMP: Recent Labs    10/26/24 0245 10/27/24 0333 10/29/24 0501 10/30/24 0343 11/11/24 1115  NA 138 139 140 141 141  K 4.2 4.1 3.6 3.7 3.5  CL 105 108 108 108 97  CO2 23 23 20* 21* 28  GLUCOSE 95 88 80 84 118*  BUN <5* 5* <5* 6* 12  CALCIUM  9.3 9.1 9.1 9.2 10.0  CREATININE 1.24* 1.36* 1.40* 1.20* 1.14  GFRNONAA 42* 38* 37* 44*  --     LIVER FUNCTION TESTS: Recent Labs    10/27/24 0333 10/28/24 1354 10/29/24 0501 10/30/24 0343 11/11/24 1115  BILITOT 0.6  --  0.9 0.8 0.9  AST 35  --  38 49* 25  ALT 20  --  23 27 11   ALKPHOS 50  --  51 56 80  PROT 5.5* 7.1 5.4* 5.8* 6.4  ALBUMIN 2.1*  --  2.2* 2.4* 3.2*    Assessment/Plan:  89 year old female with a history of acute pancreatitis complicated by a large pseudocyst. Patient underwent initial 10Fr drain placement on 11/25 followed by upsize to 12 Fr and 16Fr.   Percutaneous drainage catheter today appears clogged/not clogged ***. CT imaging ***  -Patient reporting *** no fevers/chills, improved abdominal pain, and decreased drain output -CT imaging today revealed *** -Drain injection was negative for fistula *** -Case and imaging reviewed with Dr. PIERRETTE and the decision was made to remove *** drain today *** -Return precautions were discussed including, but not limited to *** worsening abdominal pain, nausea/vomiting, fevers/chills, or similar presentation to previous abscess development *** -Patient encouraged to follow up  with ***  Signed: Glennon CHRISTELLA Bal, PA-C 12/09/2024, 4:10 PM   Please refer to Dr. PIERRETTE attestation of this note for management and plan.   I spent a total of {Established Out-Pt:[id number]} in face to face in clinical consultation, greater than 50% of which was counseling/coordinating care for drain care follow up. "

## 2024-12-12 ENCOUNTER — Encounter: Payer: Self-pay | Admitting: Gastroenterology

## 2024-12-13 ENCOUNTER — Other Ambulatory Visit

## 2024-12-13 ENCOUNTER — Other Ambulatory Visit: Payer: Self-pay | Admitting: Surgery

## 2024-12-13 ENCOUNTER — Ambulatory Visit
Admission: RE | Admit: 2024-12-13 | Discharge: 2024-12-13 | Disposition: A | Source: Ambulatory Visit | Attending: Interventional Radiology | Admitting: Interventional Radiology

## 2024-12-13 ENCOUNTER — Ambulatory Visit
Admission: RE | Admit: 2024-12-13 | Discharge: 2024-12-13 | Disposition: A | Source: Ambulatory Visit | Attending: Surgery

## 2024-12-13 DIAGNOSIS — K863 Pseudocyst of pancreas: Secondary | ICD-10-CM

## 2024-12-13 HISTORY — PX: IR RADIOLOGIST EVAL & MGMT: IMG5224

## 2024-12-15 ENCOUNTER — Ambulatory Visit: Payer: PRIVATE HEALTH INSURANCE | Admitting: Gastroenterology

## 2024-12-20 ENCOUNTER — Ambulatory Visit
Admission: RE | Admit: 2024-12-20 | Discharge: 2024-12-20 | Disposition: A | Source: Ambulatory Visit | Attending: Surgery

## 2024-12-20 ENCOUNTER — Other Ambulatory Visit

## 2024-12-20 ENCOUNTER — Other Ambulatory Visit: Payer: Self-pay | Admitting: Surgery

## 2024-12-20 DIAGNOSIS — K863 Pseudocyst of pancreas: Secondary | ICD-10-CM

## 2024-12-20 HISTORY — PX: IR RADIOLOGIST EVAL & MGMT: IMG5224

## 2024-12-20 NOTE — Progress Notes (Signed)
 "   Chief Complaint: The patient is seen in follow up today s/p drain placement for pancreatic pseudocyst on 11/25 followed by exchange and upsize on 12/11 and subsequent exchange on 12/17 and upsize to 16Fr on 1/5  Referring Physician(s): Connor,Chelsea A  Supervising Physician: Hughes Simmonds  History of present illness:  89 year old female with a history of pancreatitis and complex pancreatic pseudocyst extending from the region of the porta hepatis down the right paracolic gutter and into the anatomic pelvis. There was concern for possible infection and a percutaneous 10Fr drain was placed on 11/25 by Dr. Jennefer. Patient was seen for evaluation on 12/11 at which time she reported minimal output from her drain and repeat imaging demonstrated a persistent large fluid collection. Patient underwent upsize to a 12Fr drain that day with recommendation for additional upsize. Her drain was again exchanged on 12/17 followed by upsize to 16Fr with aggressive lavage of residual pseudocyst on 1/5. At her most recent follow up on 1/13, her daughter reported increased output from her drain since the upsize yielding approximately 30-22mL of dark/purulent fluid daily. Drain was left in at that time and she was scheduled for repeat imaging with drain evaluation to assess for change in size of fluid collection.   Patient again presents to clinic with her daughter for drain follow up. States that she has continued to have 35-38mL dark/purulent fluid out daily since her last visit on 1/13. She denies any new/worsening abdominal pain, nausea/vomiting, fevers/chills, or difficulty with the drain.   Past Medical History:  Diagnosis Date   Chest pain    Dizziness    Hyperlipidemia    Hypertension    Idiopathic gout of foot 08/30/2020    Past Surgical History:  Procedure Laterality Date   BREAST SURGERY Left 1980   biopsy   IR CATHETER TUBE CHANGE  11/10/2024   IR CATHETER TUBE CHANGE  11/16/2024   IR  CATHETER TUBE CHANGE  12/05/2024   IR RADIOLOGIST EVAL & MGMT  11/10/2024   IR RADIOLOGIST EVAL & MGMT  12/13/2024   IR THORACENTESIS RIGHT ASP PLEURAL SPACE W/IMG GUIDE  10/28/2024    Allergies: Lisinopril   Medications: Prior to Admission medications  Medication Sig Start Date End Date Taking? Authorizing Provider  acetaminophen  (TYLENOL ) 325 MG tablet Take 2 tablets (650 mg total) by mouth every 6 (six) hours as needed for moderate pain (pain score 4-6) (headache). 10/31/24   Elgergawy, Brayton RAMAN, MD  amLODipine  (NORVASC ) 10 MG tablet Take 1 tablet (10 mg total) by mouth daily. 11/11/24 12/11/24  Corwin Antu, FNP  ferrous sulfate  325 (65 FE) MG tablet TAKE 1 TABLET BY MOUTH EVERY DAY WITH BREAKFAST 12/07/24   Corwin Antu, FNP  metoprolol  tartrate (LOPRESSOR ) 25 MG tablet Take 0.5 tablets (12.5 mg total) by mouth 2 (two) times daily. 11/11/24 12/11/24  Corwin Antu, FNP  Multiple Vitamins-Minerals (MULTIVITAMIN) tablet Take 1 tablet by mouth daily. 11/17/24   Dugal, Tabitha, FNP  pantoprazole  (PROTONIX ) 40 MG tablet TAKE 1 TABLET BY MOUTH TWICE A DAY 12/07/24   Corwin Antu, FNP  sodium chloride  flush (NS) 0.9 % SOLN Flush your abd drain with 5 mL of NS from the prefilled syringe once daily. Do not forget to charge the bulb again after administering this. 10/31/24   Huneycutt, Brittany, NP     Family History  Problem Relation Age of Onset   Heart disease Mother    Heart disease Father    Stroke Maternal Grandmother    Stroke Maternal  Grandfather    Heart disease Paternal Grandmother    Heart disease Paternal Grandfather    Cancer Neg Hx    Diabetes Neg Hx     Social History   Socioeconomic History   Marital status: Married    Spouse name: Insurance Account Manager   Number of children: 1   Years of education: Not on file   Highest education level: Not on file  Occupational History   Occupation: retired  Tobacco Use   Smoking status: Former   Smokeless tobacco: Never   Tobacco comments:     quit 30 years ago  Vaping Use   Vaping status: Never Used  Substance and Sexual Activity   Alcohol use: Not Currently   Drug use: No   Sexual activity: Not Currently  Other Topics Concern   Not on file  Social History Narrative   05/23/22   From: originally from [place], then [place]    Living: with husband education officer, environmental) and grandson   Work: retired - book keeping      Family: one adopted daughter - [name]      Enjoys: nothing currently      Exercise: not currently   Diet: avoids certain foods - seafood, red meat      Safety   Seat belts: Yes    Guns: No   Safe in relationships: Yes       Social Drivers of Health   Tobacco Use: Medium Risk (11/11/2024)   Patient History    Smoking Tobacco Use: Former    Smokeless Tobacco Use: Never    Passive Exposure: Not on Actuary Strain: Low Risk (08/24/2023)   Overall Financial Resource Strain (CARDIA)    Difficulty of Paying Living Expenses: Not hard at all  Food Insecurity: No Food Insecurity (10/13/2024)   Epic    Worried About Programme Researcher, Broadcasting/film/video in the Last Year: Never true    Ran Out of Food in the Last Year: Never true  Transportation Needs: No Transportation Needs (10/13/2024)   Epic    Lack of Transportation (Medical): No    Lack of Transportation (Non-Medical): No  Physical Activity: Inactive (08/24/2023)   Exercise Vital Sign    Days of Exercise per Week: 0 days    Minutes of Exercise per Session: 0 min  Stress: No Stress Concern Present (08/24/2023)   [employer] of Occupational Health - Occupational Stress Questionnaire    Feeling of Stress : Not at all  Social Connections: Socially Isolated (10/13/2024)   Social Connection and Isolation Panel    Frequency of Communication with Friends and Family: Once a week    Frequency of Social Gatherings with Friends and Family: Once a week    Attends Religious Services: Never    Database Administrator or Organizations: No    Attends Banker Meetings:  Never    Marital Status: Widowed  Depression (PHQ2-9): Low Risk (11/11/2024)   Depression (PHQ2-9)    PHQ-2 Score: 1  Alcohol Screen: Low Risk (08/24/2023)   Alcohol Screen    Last Alcohol Screening Score (AUDIT): 0  Housing: Low Risk (10/13/2024)   Epic    Unable to Pay for Housing in the Last Year: No    Number of Times Moved in the Last Year: 0    Homeless in the Last Year: No  Utilities: Not At Risk (10/13/2024)   Epic    Threatened with loss of utilities: No  Health Literacy: Adequate Health Literacy (08/24/2023)  B1300 Health Literacy    Frequency of need for help with medical instructions: Never    Vital Signs: BP: 169/52   HR: 61   SpO2: 98% on RA   Temp: 98.23F  Physical Exam Vitals reviewed.  Constitutional:      Appearance: Normal appearance.  Cardiovascular:     Rate and Rhythm: Normal rate.  Pulmonary:     Effort: Pulmonary effort is normal.  Abdominal:     General: Abdomen is flat.     Palpations: Abdomen is soft.     Tenderness: There is no abdominal tenderness.     Comments: 16Fr RUQ drain sutured in place with clean/dry overlying bandage. Insertion site is non-erythematous and non-tender to palpation. Bulb to suction with ~43mL dark brown fluid. Flushes easily  Neurological:     Mental Status: She is alert.     Imaging: No results found.  Labs:  CBC: Recent Labs    10/26/24 0245 10/27/24 0333 10/31/24 0739 11/11/24 1115  WBC 6.7 6.4 4.7 7.9  HGB 9.6* 9.5* 10.9* 11.6*  HCT 29.4* 29.2* 34.5* 35.5*  PLT 307 373 425* 257.0    COAGS: Recent Labs    10/13/24 0517 10/17/24 0404 10/24/24 1223 10/25/24 0020  INR 1.1 1.1 1.2 1.2    BMP: Recent Labs    10/26/24 0245 10/27/24 0333 10/29/24 0501 10/30/24 0343 11/11/24 1115  NA 138 139 140 141 141  K 4.2 4.1 3.6 3.7 3.5  CL 105 108 108 108 97  CO2 23 23 20* 21* 28  GLUCOSE 95 88 80 84 118*  BUN <5* 5* <5* 6* 12  CALCIUM  9.3 9.1 9.1 9.2 10.0  CREATININE 1.24* 1.36* 1.40* 1.20* 1.14   GFRNONAA 42* 38* 37* 44*  --     LIVER FUNCTION TESTS: Recent Labs    10/27/24 0333 10/28/24 1354 10/29/24 0501 10/30/24 0343 11/11/24 1115  BILITOT 0.6  --  0.9 0.8 0.9  AST 35  --  38 49* 25  ALT 20  --  23 27 11   ALKPHOS 50  --  51 56 80  PROT 5.5* 7.1 5.4* 5.8* 6.4  ALBUMIN 2.1*  --  2.2* 2.4* 3.2*    Assessment/Plan:  89 year old female with a history of acute pancreatitis complicated by a large pseudocyst. Patient underwent initial 10Fr drain placement on 11/25 followed by upsize to 12 Fr and 16Fr.   -Percutaneous drainage catheter continues to drain well today -Patient/daughter reporting continued ~35-53mL out daily; ~75mL brown fluid in bulb on exam today  -Patient continues to deny any abdominal pain, fevers/chills, nausea/vomiting, or concerns with the drain -Repeat CT scan shows persistent fluid collection that has significantly decreased in size compared to before drain placement -Drain injection demonstrating filling of fluid cavity; no evidence of fistula on exam today -Case and imaging reviewed with Dr. JINNY Hall who recommends leaving the drain in place and switching from a suction bulb to gravity bag -Drainage bag changed and patient informed that her next appointment will be in 2-3 weeks; verbalized understanding  -Patient will return in 2-3 weeks for repeat drain evaluation and CT A/P to assess fluid collection   Signed: Glennon CHRISTELLA Bal, PA-C 12/20/2024, 7:31 AM   Please refer to Dr. Thelda attestation of this note for management and plan.   I spent a total of 15 Minutes in face to face in clinical consultation, greater than 50% of which was counseling/coordinating care for drain care. "

## 2024-12-21 ENCOUNTER — Other Ambulatory Visit: Payer: Self-pay | Admitting: Surgery

## 2024-12-21 DIAGNOSIS — K863 Pseudocyst of pancreas: Secondary | ICD-10-CM

## 2024-12-26 NOTE — Progress Notes (Unsigned)
 " Cardiology Office Note:    Date:  12/26/2024   ID:  Haley Randall, DOB 27-Jul-1936, MRN [id number]  PCP:  Corwin Antu, FNP   [facility] Providers Cardiologist:  Stanly DELENA Leavens, MD { Click to update primary MD,subspecialty MD or APP then REFRESH:1}    Referring MD: Corwin Antu, FNP   Chief complaint: Annual follow-up of chronic cardiovascular conditions     History of Present Illness:   Haley Randall is a 89 y.o. female with a hx of carotid artery disease, HTN, orthostatic hypotension, HLD, CKD stage IIIb, mitral regurgitation, gout presenting today for annual follow-up of chronic cardiovascular conditions.  Evaluated by Dr. Dann September 2023, EF 60-65%, mild MR on echo.  Hydrochlorothiazide  stopped due to orthostatic hypotension, with improvement in symptoms.  Patient reported emotional stress that led to chest discomfort, she was caring for her husband with dementia.  BPs were noted to be elevated in office, carvedilol  3.125 mg twice daily was added but patient never started.  HTN managed on irbesartan  and amlodipine .  Carotid duplex 08/2023 demonstrating bilateral carotid artery disease, R ICA 60-79%, LICA 40-59%. Referred to neurology for evaluation of possible intracranial vascular disease, patient declined, advised to start aspirin  due to carotid artery disease.  Most recent follow-up with cardiology was in 10/2023 with Haley Bane, NP, she reported feeling well at that time, she had discontinued her irbesartan  due to concern for kidney disease as directed by nephrology.  She was admitted to the hospital 10/2024 following abdominal pain and found to have pancreatitis with liver lesions on CT abdomen.  CT-guided liver biopsy performed on 10/17/2024.  MRCP demonstrated fluid accumulation in the right hemiabdomen, new gallbladder wall edema with pericholecystic fluid, right pleural effusion.  HIDA scan nonacute.  Right upper quadrant drain placed by IR.   Right thoracentesis performed.  She received 12 days of IV Zosyn .  GI and general surgery were consulted.  Started on metoprolol  2/2 tachycardia, bradycardia developed, dose was lowered to 12.5 mg p.o. twice daily.  Amlodipine  started given hypertension.  Pancreatic pseudocyst  HTN/orthostatic hypotension  CKD stage III  PACs  Mitral regurg  Carotid artery disease  ROS:   Please see the history of present illness.    *** All other systems reviewed and are negative.     Past Medical History:  Diagnosis Date   Chest pain    Dizziness    Hyperlipidemia    Hypertension    Idiopathic gout of foot 08/30/2020    Past Surgical History:  Procedure Laterality Date   BREAST SURGERY Left 1980   biopsy   IR CATHETER TUBE CHANGE  11/10/2024   IR CATHETER TUBE CHANGE  11/16/2024   IR CATHETER TUBE CHANGE  12/05/2024   IR RADIOLOGIST EVAL & MGMT  11/10/2024   IR RADIOLOGIST EVAL & MGMT  12/13/2024   IR RADIOLOGIST EVAL & MGMT  12/20/2024   IR THORACENTESIS RIGHT ASP PLEURAL SPACE W/IMG GUIDE  10/28/2024    Current Medications: Active Medications[1]   Allergies:   Lisinopril    Social History   Socioeconomic History   Marital status: Married    Spouse name: Fannie   Number of children: 1   Years of education: Not on file   Highest education level: Not on file  Occupational History   Occupation: retired  Tobacco Use   Smoking status: Former   Smokeless tobacco: Never   Tobacco comments:    quit 30 years ago  Advertising Account Planner  Vaping status: Never Used  Substance and Sexual Activity   Alcohol use: Not Currently   Drug use: No   Sexual activity: Not Currently  Other Topics Concern   Not on file  Social History Narrative   05/23/22   From: originally from [place], then [place]    Living: with husband education officer, environmental) and grandson   Work: retired - book keeping      Family: one adopted daughter - [name]      Enjoys: nothing currently      Exercise: not currently   Diet: avoids certain  foods - seafood, red meat      Safety   Seat belts: Yes    Guns: No   Safe in relationships: Yes       Social Drivers of Health   Tobacco Use: Medium Risk (11/11/2024)   Patient History    Smoking Tobacco Use: Former    Smokeless Tobacco Use: Never    Passive Exposure: Not on Actuary Strain: Low Risk (08/24/2023)   Overall Financial Resource Strain (CARDIA)    Difficulty of Paying Living Expenses: Not hard at all  Food Insecurity: No Food Insecurity (10/13/2024)   Epic    Worried About Programme Researcher, Broadcasting/film/video in the Last Year: Never true    Ran Out of Food in the Last Year: Never true  Transportation Needs: No Transportation Needs (10/13/2024)   Epic    Lack of Transportation (Medical): No    Lack of Transportation (Non-Medical): No  Physical Activity: Inactive (08/24/2023)   Exercise Vital Sign    Days of Exercise per Week: 0 days    Minutes of Exercise per Session: 0 min  Stress: No Stress Concern Present (08/24/2023)   [employer] of Occupational Health - Occupational Stress Questionnaire    Feeling of Stress : Not at all  Social Connections: Socially Isolated (10/13/2024)   Social Connection and Isolation Panel    Frequency of Communication with Friends and Family: Once a week    Frequency of Social Gatherings with Friends and Family: Once a week    Attends Religious Services: Never    Database Administrator or Organizations: No    Attends Banker Meetings: Never    Marital Status: Widowed  Depression (PHQ2-9): Low Risk (11/11/2024)   Depression (PHQ2-9)    PHQ-2 Score: 1  Alcohol Screen: Low Risk (08/24/2023)   Alcohol Screen    Last Alcohol Screening Score (AUDIT): 0  Housing: Low Risk (10/13/2024)   Epic    Unable to Pay for Housing in the Last Year: No    Number of Times Moved in the Last Year: 0    Homeless in the Last Year: No  Utilities: Not At Risk (10/13/2024)   Epic    Threatened with loss of utilities: No  Health  Literacy: Adequate Health Literacy (08/24/2023)   B1300 Health Literacy    Frequency of need for help with medical instructions: Never     Family History: The patient's ***family history includes Heart disease in her father, mother, paternal grandfather, and paternal grandmother; Stroke in her maternal grandfather and maternal grandmother. There is no history of Cancer or Diabetes.  EKGs/Labs/Other Studies Reviewed:    The following studies were reviewed today: ***      Recent Labs: 11/11/2024: ALT 11; BUN 12; Creatinine, Ser 1.14; Hemoglobin 11.6; Magnesium  1.3; Platelets 257.0; Potassium 3.5; Sodium 141; TSH 7.15  Recent Lipid Panel    Component Value Date/Time  CHOL 157 09/01/2023 0927   TRIG 74 09/01/2023 0927   HDL 86 09/01/2023 0927   CHOLHDL 1.8 09/01/2023 0927   CHOLHDL 2 06/24/2022 1010   VLDL 16.2 06/24/2022 1010   LDLCALC 57 09/01/2023 0927     Risk Assessment/Calculations:   {Does this patient have ATRIAL FIBRILLATION?[phone]}  No BP recorded.  {Refresh Note OR Click here to enter BP  :1}***         Physical Exam:    VS:  There were no vitals taken for this visit.       Wt Readings from Last 3 Encounters:  11/11/24 110 lb (49.9 kg)  10/22/24 128 lb 4.9 oz (58.2 kg)  10/26/23 104 lb 3.2 oz (47.3 kg)     GEN: *** Well nourished, well developed in no acute distress HEENT: Normal NECK: No JVD; No carotid bruits CARDIAC: *** S1-S2 normal, RRR, no murmurs, rubs, gallops RESPIRATORY:  Clear to auscultation without rales, wheezing or rhonchi  MUSCULOSKELETAL:  No edema; No deformity  SKIN: Warm and dry NEUROLOGIC:  Alert and oriented x 3 PSYCHIATRIC:  Normal affect       Assessment & Plan   Disposition: *** Route to primary cardiologist      {Are you ordering a CV Procedure (e.g. stress test, cath, DCCV, TEE, etc)?   Press F2        :[id number]}    Medication Adjustments/Labs and Tests Ordered: Current medicines are reviewed at length  with the patient today.  Concerns regarding medicines are outlined above.  No orders of the defined types were placed in this encounter.  No orders of the defined types were placed in this encounter.   There are no Patient Instructions on file for this visit.   Signed, Haley Shular E Anjel Perfetti, NP  12/26/2024 9:14 PM    [facility]     [1]  No outpatient medications have been marked as taking for the 12/28/24 encounter (Appointment) with Val Schiavo E, NP.   "

## 2024-12-28 ENCOUNTER — Ambulatory Visit: Admitting: Emergency Medicine

## 2025-01-05 ENCOUNTER — Ambulatory Visit: Payer: PRIVATE HEALTH INSURANCE | Admitting: Gastroenterology

## 2025-01-10 ENCOUNTER — Other Ambulatory Visit

## 2025-01-12 ENCOUNTER — Other Ambulatory Visit

## 2025-02-02 ENCOUNTER — Ambulatory Visit
